# Patient Record
Sex: Male | Born: 1960
Health system: Southern US, Community
[De-identification: ages and names within clinical notes are randomized; demographics above are authoritative.]

## PROBLEM LIST (undated history)

## (undated) DIAGNOSIS — Z8673 Personal history of transient ischemic attack (TIA), and cerebral infarction without residual deficits: Secondary | ICD-10-CM

## (undated) DIAGNOSIS — D497 Neoplasm of unspecified behavior of endocrine glands and other parts of nervous system: Secondary | ICD-10-CM

## (undated) DIAGNOSIS — J449 Chronic obstructive pulmonary disease, unspecified: Secondary | ICD-10-CM

## (undated) DIAGNOSIS — E785 Hyperlipidemia, unspecified: Secondary | ICD-10-CM

## (undated) DIAGNOSIS — I6529 Occlusion and stenosis of unspecified carotid artery: Secondary | ICD-10-CM

## (undated) DIAGNOSIS — I771 Stricture of artery: Secondary | ICD-10-CM

## (undated) DIAGNOSIS — G459 Transient cerebral ischemic attack, unspecified: Secondary | ICD-10-CM

## (undated) HISTORY — DX: Neoplasm of unspecified behavior of endocrine glands and other parts of nervous system: D49.7

## (undated) HISTORY — PX: OTHER SURGICAL HISTORY: SHX169

## (undated) HISTORY — DX: Hyperlipidemia, unspecified: E78.5

## (undated) HISTORY — PX: TRANSPHENOIDAL / TRANSNASAL HYPOPHYSECTOMY / RESECTION PITUITARY TUMOR: SUR1382

## (undated) HISTORY — DX: Personal history of transient ischemic attack (TIA), and cerebral infarction without residual deficits: Z86.73

## (undated) HISTORY — DX: Occlusion and stenosis of unspecified carotid artery: I65.29

## (undated) HISTORY — DX: Stricture of artery: I77.1

## (undated) HISTORY — PX: HIP ARTHROPLASTY: SHX981

## (undated) HISTORY — DX: Transient cerebral ischemic attack, unspecified: G45.9

---

## 1994-10-30 HISTORY — PX: OTHER SURGICAL HISTORY: SHX169

## 2008-06-29 ENCOUNTER — Emergency Department (HOSPITAL_COMMUNITY): Admission: EM | Admit: 2008-06-29 | Discharge: 2008-06-29 | Payer: Self-pay | Admitting: Emergency Medicine

## 2011-11-07 DIAGNOSIS — J069 Acute upper respiratory infection, unspecified: Secondary | ICD-10-CM | POA: Diagnosis not present

## 2011-12-20 DIAGNOSIS — R220 Localized swelling, mass and lump, head: Secondary | ICD-10-CM | POA: Diagnosis not present

## 2011-12-20 DIAGNOSIS — K029 Dental caries, unspecified: Secondary | ICD-10-CM | POA: Diagnosis not present

## 2012-01-16 DIAGNOSIS — M25559 Pain in unspecified hip: Secondary | ICD-10-CM | POA: Diagnosis not present

## 2012-01-16 DIAGNOSIS — F329 Major depressive disorder, single episode, unspecified: Secondary | ICD-10-CM | POA: Diagnosis not present

## 2012-01-16 DIAGNOSIS — E782 Mixed hyperlipidemia: Secondary | ICD-10-CM | POA: Diagnosis not present

## 2012-01-16 DIAGNOSIS — M25569 Pain in unspecified knee: Secondary | ICD-10-CM | POA: Diagnosis not present

## 2012-01-16 DIAGNOSIS — Z0000 Encounter for general adult medical examination without abnormal findings: Secondary | ICD-10-CM | POA: Diagnosis not present

## 2012-01-16 DIAGNOSIS — I10 Essential (primary) hypertension: Secondary | ICD-10-CM | POA: Diagnosis not present

## 2012-01-16 DIAGNOSIS — Z125 Encounter for screening for malignant neoplasm of prostate: Secondary | ICD-10-CM | POA: Diagnosis not present

## 2012-03-06 DIAGNOSIS — Z1211 Encounter for screening for malignant neoplasm of colon: Secondary | ICD-10-CM | POA: Diagnosis not present

## 2012-03-06 DIAGNOSIS — Z800 Family history of malignant neoplasm of digestive organs: Secondary | ICD-10-CM | POA: Diagnosis not present

## 2012-03-21 DIAGNOSIS — F329 Major depressive disorder, single episode, unspecified: Secondary | ICD-10-CM | POA: Diagnosis not present

## 2012-03-21 DIAGNOSIS — Z8371 Family history of colonic polyps: Secondary | ICD-10-CM | POA: Diagnosis not present

## 2012-03-21 DIAGNOSIS — I498 Other specified cardiac arrhythmias: Secondary | ICD-10-CM | POA: Diagnosis not present

## 2012-03-21 DIAGNOSIS — K5730 Diverticulosis of large intestine without perforation or abscess without bleeding: Secondary | ICD-10-CM | POA: Diagnosis not present

## 2012-03-21 DIAGNOSIS — I10 Essential (primary) hypertension: Secondary | ICD-10-CM | POA: Diagnosis not present

## 2012-03-21 DIAGNOSIS — E782 Mixed hyperlipidemia: Secondary | ICD-10-CM | POA: Diagnosis not present

## 2012-03-21 DIAGNOSIS — D126 Benign neoplasm of colon, unspecified: Secondary | ICD-10-CM | POA: Diagnosis not present

## 2012-03-21 DIAGNOSIS — Z800 Family history of malignant neoplasm of digestive organs: Secondary | ICD-10-CM | POA: Diagnosis not present

## 2012-03-21 DIAGNOSIS — F17200 Nicotine dependence, unspecified, uncomplicated: Secondary | ICD-10-CM | POA: Diagnosis not present

## 2012-03-21 DIAGNOSIS — Z1211 Encounter for screening for malignant neoplasm of colon: Secondary | ICD-10-CM | POA: Diagnosis not present

## 2012-09-17 DIAGNOSIS — L600 Ingrowing nail: Secondary | ICD-10-CM | POA: Diagnosis not present

## 2012-10-08 DIAGNOSIS — L03039 Cellulitis of unspecified toe: Secondary | ICD-10-CM | POA: Diagnosis not present

## 2014-12-31 DIAGNOSIS — H53002 Unspecified amblyopia, left eye: Secondary | ICD-10-CM | POA: Diagnosis not present

## 2015-01-05 DIAGNOSIS — R2990 Unspecified symptoms and signs involving the nervous system: Secondary | ICD-10-CM | POA: Diagnosis not present

## 2015-01-05 DIAGNOSIS — R0989 Other specified symptoms and signs involving the circulatory and respiratory systems: Secondary | ICD-10-CM | POA: Diagnosis not present

## 2015-01-05 DIAGNOSIS — R42 Dizziness and giddiness: Secondary | ICD-10-CM | POA: Diagnosis not present

## 2015-01-05 DIAGNOSIS — Z720 Tobacco use: Secondary | ICD-10-CM | POA: Diagnosis not present

## 2015-01-08 ENCOUNTER — Other Ambulatory Visit: Payer: Self-pay | Admitting: Radiology

## 2015-01-08 ENCOUNTER — Other Ambulatory Visit (HOSPITAL_COMMUNITY): Payer: Self-pay | Admitting: Interventional Radiology

## 2015-01-08 ENCOUNTER — Ambulatory Visit (HOSPITAL_COMMUNITY)
Admission: RE | Admit: 2015-01-08 | Discharge: 2015-01-08 | Disposition: A | Discharge status: Home / Self Care | Payer: Medicare Other | Source: Ambulatory Visit | Attending: Interventional Radiology | Admitting: Interventional Radiology

## 2015-01-08 DIAGNOSIS — I6501 Occlusion and stenosis of right vertebral artery: Secondary | ICD-10-CM | POA: Diagnosis not present

## 2015-01-08 DIAGNOSIS — I6522 Occlusion and stenosis of left carotid artery: Secondary | ICD-10-CM

## 2015-01-08 DIAGNOSIS — I6523 Occlusion and stenosis of bilateral carotid arteries: Secondary | ICD-10-CM | POA: Diagnosis not present

## 2015-01-08 DIAGNOSIS — I70208 Unspecified atherosclerosis of native arteries of extremities, other extremity: Secondary | ICD-10-CM | POA: Diagnosis not present

## 2015-01-08 DIAGNOSIS — R279 Unspecified lack of coordination: Secondary | ICD-10-CM | POA: Diagnosis not present

## 2015-01-08 DIAGNOSIS — R2990 Unspecified symptoms and signs involving the nervous system: Secondary | ICD-10-CM | POA: Diagnosis not present

## 2015-01-08 DIAGNOSIS — I6529 Occlusion and stenosis of unspecified carotid artery: Secondary | ICD-10-CM | POA: Diagnosis not present

## 2015-01-08 DIAGNOSIS — R42 Dizziness and giddiness: Secondary | ICD-10-CM | POA: Diagnosis not present

## 2015-01-08 DIAGNOSIS — Z9989 Dependence on other enabling machines and devices: Secondary | ICD-10-CM | POA: Diagnosis not present

## 2015-01-08 DIAGNOSIS — F17210 Nicotine dependence, cigarettes, uncomplicated: Secondary | ICD-10-CM | POA: Diagnosis not present

## 2015-01-08 DIAGNOSIS — H538 Other visual disturbances: Secondary | ICD-10-CM | POA: Diagnosis present

## 2015-01-08 DIAGNOSIS — I6509 Occlusion and stenosis of unspecified vertebral artery: Secondary | ICD-10-CM | POA: Diagnosis not present

## 2015-01-08 LAB — CBC WITH DIFFERENTIAL/PLATELET
BASOS ABS: 0.1 10*3/uL (ref 0.0–0.1)
Basophils Relative: 1 % (ref 0–1)
Eosinophils Absolute: 0.1 10*3/uL (ref 0.0–0.7)
Eosinophils Relative: 1 % (ref 0–5)
HEMATOCRIT: 41.1 % (ref 39.0–52.0)
HEMOGLOBIN: 13.3 g/dL (ref 13.0–17.0)
LYMPHS ABS: 3 10*3/uL (ref 0.7–4.0)
LYMPHS PCT: 29 % (ref 12–46)
MCH: 28.7 pg (ref 26.0–34.0)
MCHC: 32.4 g/dL (ref 30.0–36.0)
MCV: 88.6 fL (ref 78.0–100.0)
MONO ABS: 0.7 10*3/uL (ref 0.1–1.0)
MONOS PCT: 7 % (ref 3–12)
NEUTROS ABS: 6.3 10*3/uL (ref 1.7–7.7)
NEUTROS PCT: 62 % (ref 43–77)
Platelets: 326 10*3/uL (ref 150–400)
RBC: 4.64 MIL/uL (ref 4.22–5.81)
RDW: 12.8 % (ref 11.5–15.5)
WBC: 10.1 10*3/uL (ref 4.0–10.5)

## 2015-01-08 LAB — BASIC METABOLIC PANEL
ANION GAP: 16 — AB (ref 5–15)
BUN: <5 mg/dL — ABNORMAL LOW (ref 6–23)
CALCIUM: 9.6 mg/dL (ref 8.4–10.5)
CHLORIDE: 102 mmol/L (ref 96–112)
CO2: 26 mmol/L (ref 19–32)
Creatinine, Ser: 0.93 mg/dL (ref 0.50–1.35)
GFR calc Af Amer: >90 mL/min (ref >90)
Glucose, Bld: 94 mg/dL (ref 70–99)
POTASSIUM: 3.7 mmol/L (ref 3.5–5.1)
SODIUM: 144 mmol/L (ref 135–145)

## 2015-01-08 LAB — APTT: aPTT: 34 seconds (ref 24–37)

## 2015-01-08 LAB — PROTIME-INR
INR: 1.09 (ref 0.00–1.49)
Prothrombin Time: 14.2 seconds (ref 11.6–15.2)

## 2015-01-08 MED ORDER — ASPIRIN 325 MG PO TABS
ORAL_TABLET | ORAL | Status: AC
  Administered 2015-01-08: 325 mg
  Filled 2015-01-08: qty 1

## 2015-01-08 MED ORDER — MIDAZOLAM HCL 2 MG/2ML IJ SOLN
INTRAMUSCULAR | Status: AC
  Filled 2015-01-08: qty 2

## 2015-01-08 MED ORDER — LIDOCAINE HCL 1 % IJ SOLN
INTRAMUSCULAR | Status: AC
  Filled 2015-01-08: qty 20

## 2015-01-08 MED ORDER — FENTANYL CITRATE 0.05 MG/ML IJ SOLN
INTRAMUSCULAR | Status: AC
  Filled 2015-01-08: qty 2

## 2015-01-08 MED ORDER — SODIUM CHLORIDE 0.9 % IV SOLN
INTRAVENOUS | Status: DC
  Administered 2015-01-08: 12:00:00 via INTRAVENOUS

## 2015-01-08 MED ORDER — FENTANYL CITRATE 0.05 MG/ML IJ SOLN
INTRAMUSCULAR | Status: AC | PRN
  Administered 2015-01-08: 25 ug via INTRAVENOUS

## 2015-01-08 MED ORDER — HEPARIN SOD (PORK) LOCK FLUSH 100 UNIT/ML IV SOLN
INTRAVENOUS | Status: AC | PRN
  Administered 2015-01-08: 1000 [IU] via INTRAVENOUS
  Administered 2015-01-08: 500 [IU] via INTRAVENOUS

## 2015-01-08 MED ORDER — SODIUM CHLORIDE 0.9 % IV SOLN: INTRAVENOUS | Status: AC

## 2015-01-08 MED ORDER — IOHEXOL 300 MG/ML  SOLN
150.0000 mL | Freq: Once | INTRAMUSCULAR | Status: AC | PRN
  Administered 2015-01-08: 120 mL via INTRAVENOUS

## 2015-01-08 NOTE — Sedation Documentation (Signed)
Patient denies pain and is resting comfortably.  

## 2015-01-08 NOTE — Procedures (Signed)
S/P arch aortogram ,and Rt common carotid arteriogram. Rt CFa approach. Findings . 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 3.90 % stenosis of Rt VA origin 4.Lt subclavian steal

## 2015-01-08 NOTE — Discharge Instructions (Signed)

## 2015-01-08 NOTE — H&P (Signed)
Chief Complaint: Left eye headaches and dizziness x 5 weeks.  Referring Physician(s): Wagner,Jaime  History of Present Illness: Raymond Carney is a 54 y.o. male with c/o left orbital HA and change in vision with left sided blurry vision and diplopia x 5 weeks. He also c/o dizziness x 5 weeks and was scheduled for carotid US today which was abnormal and has been referred to Dr. [name] for arteriogram. He has history of pituitary surgery in the 1990's and denies any history of DM, HTN or HLP. He does smoke tobacco approximately 2packs/week. He does not take any medications regularly. He denies any history of bleeding disorders or any active signs of bleeding. He denies any chest pain, shortness of breath or palpitations. He denies any recent fever or chills. The patient denies any history of sleep apnea or chronic oxygen use. He has previously tolerated anesthesia with a hip surgery without complications.   PmHx: Tobacco use  SurgHX: Hip surgery, pituitary surgery  Allergies: Review of patient's allergies indicates no known allergies.  Medications: NONE. Prior to Admission medications   Not on File   No family history on file.  History   Social History  . Marital Status: Single    Spouse Name: N/A  . Number of Children: N/A  . Years of Education: N/A   Social History Main Topics  . Smoking status: Not on file  . Smokeless tobacco: Not on file  . Alcohol Use: Not on file  . Drug Use: Not on file  . Sexual Activity: Not on file   Other Topics Concern  . Not on file   Social History Narrative  . No narrative on file   Review of Systems: A 12 point ROS discussed and pertinent positives are indicated in the HPI above.  All other systems are negative.  Review of Systems  Vital Signs: BP 104/71 mmHg  Pulse 44  Temp(Src) 97.4 F (36.3 C)  SpO2 98%  Physical Exam  Constitutional: He is oriented to person, place, and time. No distress.  HENT:  Head:  Normocephalic and atraumatic.  Neck: No tracheal deviation present.  Cardiovascular: Normal rate and regular rhythm.  Exam reveals no gallop and no friction rub.   No murmur heard. Right carotid bruit  Pulmonary/Chest: Breath sounds normal. No respiratory distress. He has no wheezes. He has no rales.  Abdominal: Soft. Bowel sounds are normal. He exhibits no distension. There is no tenderness.  Neurological: He is alert and oriented to person, place, and time.  Skin: He is not diaphoretic.  Psychiatric: He has a normal mood and affect. His behavior is normal. Thought content normal.    Mallampati Score:  MD Evaluation Airway: WNL Heart: WNL Abdomen: WNL Chest/ Lungs: WNL ASA  Classification: 2 Mallampati/Airway Score: Two  Imaging: No results found.  Labs:  CBC:  Recent Labs  01/08/15 1152  WBC 10.1  HGB 13.3  HCT 41.1  PLT 326    COAGS:  Recent Labs  01/08/15 1152  INR 1.09    BMP:  Recent Labs  01/08/15 1152  NA 144  K 3.7  CL 102  CO2 26  GLUCOSE 94  BUN <5*  CALCIUM 9.6  CREATININE 0.93  GFRNONAA >90  GFRAA >90    Assessment and Plan: History of pituitary surgery with ongoing left eye blurriness and double vision Left orbital headache 5 weeks  Dizziness x weeks Tobacco use 2packs per week Carotid US today with abnormal findings suggestive of left  CCA occlusion and Right ICA/ECA stenosis with moderate sedation The patient has been NPO, no blood thinners taken, labs and vitals have been reviewed. Risks and Benefits discussed with the patient including, but not limited to bleeding, infection or stroke. All of the patient's questions were answered, patient is agreeable to proceed. Consent signed and in chart.   SignedHedy Jacob 01/08/2015, 1:34 PM

## 2015-01-12 ENCOUNTER — Other Ambulatory Visit (HOSPITAL_COMMUNITY): Payer: Self-pay | Admitting: Interventional Radiology

## 2015-01-12 DIAGNOSIS — I639 Cerebral infarction, unspecified: Secondary | ICD-10-CM

## 2015-02-01 ENCOUNTER — Ambulatory Visit (HOSPITAL_COMMUNITY)
Admission: RE | Admit: 2015-02-01 | Discharge: 2015-02-01 | Disposition: A | Discharge status: Home / Self Care | Payer: Medicare Other | Source: Ambulatory Visit | Attending: Interventional Radiology | Admitting: Interventional Radiology

## 2015-02-01 DIAGNOSIS — I6529 Occlusion and stenosis of unspecified carotid artery: Secondary | ICD-10-CM | POA: Diagnosis not present

## 2015-02-01 DIAGNOSIS — I639 Cerebral infarction, unspecified: Secondary | ICD-10-CM

## 2015-02-02 DIAGNOSIS — E785 Hyperlipidemia, unspecified: Secondary | ICD-10-CM | POA: Diagnosis not present

## 2015-02-02 DIAGNOSIS — J449 Chronic obstructive pulmonary disease, unspecified: Secondary | ICD-10-CM | POA: Diagnosis not present

## 2015-02-02 DIAGNOSIS — G479 Sleep disorder, unspecified: Secondary | ICD-10-CM | POA: Diagnosis not present

## 2015-02-02 DIAGNOSIS — Z87891 Personal history of nicotine dependence: Secondary | ICD-10-CM | POA: Diagnosis not present

## 2015-02-02 DIAGNOSIS — Z1212 Encounter for screening for malignant neoplasm of rectum: Secondary | ICD-10-CM | POA: Diagnosis not present

## 2015-02-02 DIAGNOSIS — I739 Peripheral vascular disease, unspecified: Secondary | ICD-10-CM | POA: Diagnosis not present

## 2015-02-02 DIAGNOSIS — Z6820 Body mass index (BMI) 20.0-20.9, adult: Secondary | ICD-10-CM | POA: Diagnosis not present

## 2015-02-02 DIAGNOSIS — I10 Essential (primary) hypertension: Secondary | ICD-10-CM | POA: Diagnosis not present

## 2015-02-02 DIAGNOSIS — R0602 Shortness of breath: Secondary | ICD-10-CM | POA: Diagnosis not present

## 2015-02-02 DIAGNOSIS — Z0000 Encounter for general adult medical examination without abnormal findings: Secondary | ICD-10-CM | POA: Diagnosis not present

## 2015-02-02 DIAGNOSIS — R2990 Unspecified symptoms and signs involving the nervous system: Secondary | ICD-10-CM | POA: Diagnosis not present

## 2015-02-02 DIAGNOSIS — R195 Other fecal abnormalities: Secondary | ICD-10-CM | POA: Diagnosis not present

## 2015-02-04 ENCOUNTER — Other Ambulatory Visit: Payer: Self-pay | Admitting: Radiology

## 2015-02-05 ENCOUNTER — Other Ambulatory Visit (HOSPITAL_COMMUNITY): Payer: Self-pay | Admitting: Interventional Radiology

## 2015-02-05 DIAGNOSIS — I639 Cerebral infarction, unspecified: Secondary | ICD-10-CM

## 2015-02-05 DIAGNOSIS — I771 Stricture of artery: Secondary | ICD-10-CM

## 2015-02-18 DIAGNOSIS — J449 Chronic obstructive pulmonary disease, unspecified: Secondary | ICD-10-CM | POA: Diagnosis not present

## 2015-02-18 DIAGNOSIS — R5383 Other fatigue: Secondary | ICD-10-CM | POA: Diagnosis not present

## 2015-02-18 DIAGNOSIS — Z87891 Personal history of nicotine dependence: Secondary | ICD-10-CM | POA: Diagnosis not present

## 2015-02-22 DIAGNOSIS — E559 Vitamin D deficiency, unspecified: Secondary | ICD-10-CM | POA: Diagnosis not present

## 2015-02-23 ENCOUNTER — Ambulatory Visit (HOSPITAL_COMMUNITY): Admission: RE | Admit: 2015-02-23 | Payer: Medicare Other | Source: Ambulatory Visit

## 2015-02-23 ENCOUNTER — Ambulatory Visit (HOSPITAL_COMMUNITY)
Admission: RE | Admit: 2015-02-23 | Discharge: 2015-02-23 | Disposition: A | Discharge status: Home / Self Care | Payer: Medicare Other | Source: Ambulatory Visit | Attending: Interventional Radiology | Admitting: Interventional Radiology

## 2015-02-23 DIAGNOSIS — I6522 Occlusion and stenosis of left carotid artery: Secondary | ICD-10-CM | POA: Insufficient documentation

## 2015-02-23 DIAGNOSIS — H538 Other visual disturbances: Secondary | ICD-10-CM | POA: Insufficient documentation

## 2015-02-23 DIAGNOSIS — I771 Stricture of artery: Secondary | ICD-10-CM

## 2015-02-23 DIAGNOSIS — R42 Dizziness and giddiness: Secondary | ICD-10-CM | POA: Insufficient documentation

## 2015-02-23 DIAGNOSIS — I639 Cerebral infarction, unspecified: Secondary | ICD-10-CM | POA: Diagnosis not present

## 2015-02-23 DIAGNOSIS — I720 Aneurysm of carotid artery: Secondary | ICD-10-CM | POA: Diagnosis not present

## 2015-02-23 LAB — POCT I-STAT CREATININE: Creatinine, Ser: 0.9 mg/dL (ref 0.50–1.35)

## 2015-02-23 MED ORDER — GADOBENATE DIMEGLUMINE 529 MG/ML IV SOLN
15.0000 mL | Freq: Once | INTRAVENOUS | Status: AC | PRN
  Administered 2015-02-23: 15 mL via INTRAVENOUS

## 2015-02-26 DIAGNOSIS — Z87891 Personal history of nicotine dependence: Secondary | ICD-10-CM | POA: Diagnosis not present

## 2015-02-26 DIAGNOSIS — J449 Chronic obstructive pulmonary disease, unspecified: Secondary | ICD-10-CM | POA: Diagnosis not present

## 2015-02-26 DIAGNOSIS — R5383 Other fatigue: Secondary | ICD-10-CM | POA: Diagnosis not present

## 2015-03-02 DIAGNOSIS — J449 Chronic obstructive pulmonary disease, unspecified: Secondary | ICD-10-CM | POA: Diagnosis not present

## 2015-03-18 DIAGNOSIS — Z01810 Encounter for preprocedural cardiovascular examination: Secondary | ICD-10-CM | POA: Diagnosis not present

## 2015-03-18 DIAGNOSIS — J449 Chronic obstructive pulmonary disease, unspecified: Secondary | ICD-10-CM | POA: Diagnosis not present

## 2015-03-24 DIAGNOSIS — Z87891 Personal history of nicotine dependence: Secondary | ICD-10-CM | POA: Diagnosis not present

## 2015-03-24 DIAGNOSIS — R5383 Other fatigue: Secondary | ICD-10-CM | POA: Diagnosis not present

## 2015-03-24 DIAGNOSIS — J449 Chronic obstructive pulmonary disease, unspecified: Secondary | ICD-10-CM | POA: Diagnosis not present

## 2015-05-23 ENCOUNTER — Inpatient Hospital Stay (HOSPITAL_COMMUNITY)
Admission: EM | Admit: 2015-05-23 | Discharge: 2015-05-27 | DRG: 037 | Disposition: A | Discharge status: Home / Self Care | Payer: Medicare Other | Attending: Neurology | Admitting: Neurology

## 2015-05-23 ENCOUNTER — Emergency Department (HOSPITAL_COMMUNITY): Payer: Medicare Other

## 2015-05-23 ENCOUNTER — Encounter (HOSPITAL_COMMUNITY): Payer: Self-pay

## 2015-05-23 DIAGNOSIS — G459 Transient cerebral ischemic attack, unspecified: Secondary | ICD-10-CM | POA: Diagnosis not present

## 2015-05-23 DIAGNOSIS — I708 Atherosclerosis of other arteries: Secondary | ICD-10-CM | POA: Diagnosis present

## 2015-05-23 DIAGNOSIS — I6501 Occlusion and stenosis of right vertebral artery: Secondary | ICD-10-CM | POA: Diagnosis not present

## 2015-05-23 DIAGNOSIS — R471 Dysarthria and anarthria: Secondary | ICD-10-CM | POA: Diagnosis not present

## 2015-05-23 DIAGNOSIS — I771 Stricture of artery: Secondary | ICD-10-CM

## 2015-05-23 DIAGNOSIS — E785 Hyperlipidemia, unspecified: Secondary | ICD-10-CM | POA: Diagnosis present

## 2015-05-23 DIAGNOSIS — I7410 Embolism and thrombosis of unspecified parts of aorta: Secondary | ICD-10-CM | POA: Diagnosis not present

## 2015-05-23 DIAGNOSIS — R4701 Aphasia: Secondary | ICD-10-CM | POA: Diagnosis present

## 2015-05-23 DIAGNOSIS — R001 Bradycardia, unspecified: Secondary | ICD-10-CM | POA: Diagnosis present

## 2015-05-23 DIAGNOSIS — I63512 Cerebral infarction due to unspecified occlusion or stenosis of left middle cerebral artery: Secondary | ICD-10-CM | POA: Diagnosis present

## 2015-05-23 DIAGNOSIS — J449 Chronic obstructive pulmonary disease, unspecified: Secondary | ICD-10-CM | POA: Diagnosis present

## 2015-05-23 DIAGNOSIS — I63032 Cerebral infarction due to thrombosis of left carotid artery: Secondary | ICD-10-CM | POA: Diagnosis not present

## 2015-05-23 DIAGNOSIS — I63211 Cerebral infarction due to unspecified occlusion or stenosis of right vertebral arteries: Secondary | ICD-10-CM | POA: Diagnosis not present

## 2015-05-23 DIAGNOSIS — I6789 Other cerebrovascular disease: Secondary | ICD-10-CM | POA: Diagnosis not present

## 2015-05-23 DIAGNOSIS — I639 Cerebral infarction, unspecified: Secondary | ICD-10-CM | POA: Diagnosis present

## 2015-05-23 DIAGNOSIS — I742 Embolism and thrombosis of arteries of the upper extremities: Secondary | ICD-10-CM | POA: Diagnosis not present

## 2015-05-23 DIAGNOSIS — R531 Weakness: Secondary | ICD-10-CM | POA: Diagnosis not present

## 2015-05-23 DIAGNOSIS — R1310 Dysphagia, unspecified: Secondary | ICD-10-CM | POA: Diagnosis present

## 2015-05-23 DIAGNOSIS — I7409 Other arterial embolism and thrombosis of abdominal aorta: Secondary | ICD-10-CM | POA: Diagnosis not present

## 2015-05-23 DIAGNOSIS — Z7982 Long term (current) use of aspirin: Secondary | ICD-10-CM | POA: Diagnosis not present

## 2015-05-23 DIAGNOSIS — R4781 Slurred speech: Secondary | ICD-10-CM | POA: Diagnosis not present

## 2015-05-23 DIAGNOSIS — M6289 Other specified disorders of muscle: Secondary | ICD-10-CM | POA: Diagnosis not present

## 2015-05-23 DIAGNOSIS — I69920 Aphasia following unspecified cerebrovascular disease: Secondary | ICD-10-CM | POA: Diagnosis not present

## 2015-05-23 DIAGNOSIS — Z7902 Long term (current) use of antithrombotics/antiplatelets: Secondary | ICD-10-CM

## 2015-05-23 HISTORY — DX: Chronic obstructive pulmonary disease, unspecified: J44.9

## 2015-05-23 LAB — COMPREHENSIVE METABOLIC PANEL
ALK PHOS: 57 U/L (ref 38–126)
ALT: 15 U/L — AB (ref 17–63)
AST: 19 U/L (ref 15–41)
Albumin: 3.8 g/dL (ref 3.5–5.0)
Anion gap: 9 (ref 5–15)
BILIRUBIN TOTAL: 0.3 mg/dL (ref 0.3–1.2)
BUN: 9 mg/dL (ref 6–20)
CALCIUM: 8.4 mg/dL — AB (ref 8.9–10.3)
CHLORIDE: 99 mmol/L — AB (ref 101–111)
CO2: 24 mmol/L (ref 22–32)
Creatinine, Ser: 0.79 mg/dL (ref 0.61–1.24)
GLUCOSE: 102 mg/dL — AB (ref 65–99)
POTASSIUM: 3.8 mmol/L (ref 3.5–5.1)
Sodium: 132 mmol/L — ABNORMAL LOW (ref 135–145)
Total Protein: 6.5 g/dL (ref 6.5–8.1)

## 2015-05-23 LAB — CBC
HCT: 40.5 % (ref 39.0–52.0)
HCT: 37.1 % — ABNORMAL LOW (ref 39.0–52.0)
HEMOGLOBIN: 12.1 g/dL — AB (ref 13.0–17.0)
Hemoglobin: 13.3 g/dL (ref 13.0–17.0)
MCH: 28.1 pg (ref 26.0–34.0)
MCH: 28.1 pg (ref 26.0–34.0)
MCHC: 32.8 g/dL (ref 30.0–36.0)
MCHC: 32.6 g/dL (ref 30.0–36.0)
MCV: 85.4 fL (ref 78.0–100.0)
MCV: 86.3 fL (ref 78.0–100.0)
PLATELETS: 246 10*3/uL (ref 150–400)
Platelets: 232 10*3/uL (ref 150–400)
RBC: 4.74 MIL/uL (ref 4.22–5.81)
RBC: 4.3 MIL/uL (ref 4.22–5.81)
RDW: 13.7 % (ref 11.5–15.5)
RDW: 13.7 % (ref 11.5–15.5)
WBC: 8.1 10*3/uL (ref 4.0–10.5)
WBC: 9.7 10*3/uL (ref 4.0–10.5)

## 2015-05-23 LAB — I-STAT CHEM 8, ED
BUN: 11 mg/dL (ref 6–20)
CHLORIDE: 105 mmol/L (ref 101–111)
CREATININE: 0.8 mg/dL (ref 0.61–1.24)
Calcium, Ion: 1.09 mmol/L — ABNORMAL LOW (ref 1.12–1.23)
GLUCOSE: 100 mg/dL — AB (ref 65–99)
HEMATOCRIT: 39 % (ref 39.0–52.0)
Hemoglobin: 13.3 g/dL (ref 13.0–17.0)
Potassium: 4.1 mmol/L (ref 3.5–5.1)
SODIUM: 141 mmol/L (ref 135–145)
TCO2: 25 mmol/L (ref 0–100)

## 2015-05-23 LAB — DIFFERENTIAL
Basophils Absolute: 0.1 10*3/uL (ref 0.0–0.1)
Basophils Relative: 1 % (ref 0–1)
Eosinophils Absolute: 0.1 10*3/uL (ref 0.0–0.7)
Eosinophils Relative: 2 % (ref 0–5)
LYMPHS ABS: 2.6 10*3/uL (ref 0.7–4.0)
Lymphocytes Relative: 33 % (ref 12–46)
Monocytes Absolute: 0.5 10*3/uL (ref 0.1–1.0)
Monocytes Relative: 7 % (ref 3–12)
Neutro Abs: 4.7 10*3/uL (ref 1.7–7.7)
Neutrophils Relative %: 59 % (ref 43–77)

## 2015-05-23 LAB — APTT: aPTT: 29 seconds (ref 24–37)

## 2015-05-23 LAB — I-STAT CG4 LACTIC ACID, ED: Lactic Acid, Venous: 1.63 mmol/L (ref 0.5–2.0)

## 2015-05-23 LAB — PROTIME-INR
INR: 1.06 (ref 0.00–1.49)
Prothrombin Time: 14 seconds (ref 11.6–15.2)

## 2015-05-23 LAB — CBG MONITORING, ED: Glucose-Capillary: 98 mg/dL (ref 65–99)

## 2015-05-23 LAB — I-STAT TROPONIN, ED: Troponin i, poc: 0 ng/mL (ref 0.00–0.08)

## 2015-05-23 MED ORDER — STROKE: EARLY STAGES OF RECOVERY BOOK
Freq: Once | Status: AC
  Administered 2015-05-23: 23:00:00
  Filled 2015-05-23: qty 1

## 2015-05-23 MED ORDER — SODIUM CHLORIDE 0.9 % IV BOLUS (SEPSIS)
500.0000 mL | Freq: Once | INTRAVENOUS | Status: AC
  Administered 2015-05-23: 500 mL via INTRAVENOUS

## 2015-05-23 MED ORDER — SODIUM CHLORIDE 0.9 % IV SOLN
Freq: Once | INTRAVENOUS | Status: AC
  Administered 2015-05-23: 22:00:00 via INTRAVENOUS

## 2015-05-23 MED ORDER — SENNOSIDES-DOCUSATE SODIUM 8.6-50 MG PO TABS
1.0000 | ORAL_TABLET | Freq: Every evening | ORAL | Status: DC | PRN
  Filled 2015-05-23: qty 1

## 2015-05-23 MED ORDER — ENOXAPARIN SODIUM 40 MG/0.4ML ~~LOC~~ SOLN
40.0000 mg | SUBCUTANEOUS | Status: DC
  Administered 2015-05-25: 40 mg via SUBCUTANEOUS
  Filled 2015-05-23 (×2): qty 0.4

## 2015-05-23 MED ORDER — SODIUM CHLORIDE 0.9 % IV BOLUS (SEPSIS)
1000.0000 mL | Freq: Once | INTRAVENOUS | Status: AC
  Administered 2015-05-23: 1000 mL via INTRAVENOUS

## 2015-05-23 NOTE — ED Notes (Signed)
Updated rapid response Wes with BP and MRI delay.

## 2015-05-23 NOTE — ED Notes (Signed)
To ED via EMS.  Onset 1800 LSN, right sided weakness, slurred speech, aphasia, lasting 1 hr 15 min.  EMS put IV in and pt returned to baseline @ 1911.  At 1935 symptoms returned.  1942 pt back at baseline.

## 2015-05-23 NOTE — ED Notes (Signed)
Katy NP with Critical Care came by room to advise d/t poor vascular system BP may not be as accurate and she will come by later to check on patient again.  From her perspective he may go to MRI.  Dr. Johnney Killian made aware.

## 2015-05-23 NOTE — ED Notes (Signed)
Called MRI to report per Dr. Johnney Killian ok to go to MRI.

## 2015-05-23 NOTE — ED Notes (Signed)
Per Dr. Johnney Killian delay MRI until BP can be managed.

## 2015-05-23 NOTE — ED Provider Notes (Signed)
CSN: [id number]     Arrival date & time 05/23/15  1947 History   First MD Initiated Contact with Patient 05/23/15 1949     Chief Complaint  Patient presents with  . Code Stroke    An emergency department physician performed an initial assessment on this suspected stroke patient at 54. (Consider location/radiation/quality/duration/timing/severity/associated sxs/prior Treatment) HPI  Patient is a 54 year old male with a history of carotid artery stenosis bilaterally presented today as a code stroke. Patient reports at roughly 6 PM (2 hours ago) he began having mild right hip pain progressed to difficulty talking and right-sided weakness. Patient has been seen and evaluated for carotid stenosis previously in March. Reports having low blood pressure then and it has been difficult to to measure at times because it is so low. Today patient reports after the weakness began and continued until EMS arrived. When EMS arrived symptoms resolved. Patient reports no chest pain, shortness breath, dizziness, fevers, chills, headaches, nausea, vomiting, blurred vision, double vision over the last several days or weeks. Immediately prior to arrival EMS reported symptoms began again. On arrival patient able to speak  Past Medical History  Diagnosis Date  . COPD (chronic obstructive pulmonary disease)    No past surgical history on file. No family history on file. History  Substance Use Topics  . Smoking status: Not on file  . Smokeless tobacco: Not on file  . Alcohol Use: Not on file    Review of Systems  Constitutional: Negative for fever and chills.  HENT: Negative for congestion and sore throat.   Eyes: Negative for pain.  Respiratory: Negative for cough and shortness of breath.   Cardiovascular: Negative for chest pain and palpitations.  Gastrointestinal: Negative for nausea, vomiting, abdominal pain and diarrhea.  Endocrine: Negative.   Genitourinary: Negative for flank pain.  Musculoskeletal:  Negative for back pain and neck pain.  Skin: Negative for rash.  Allergic/Immunologic: Negative.   Neurological: Positive for speech difficulty and weakness. Negative for dizziness, tremors, seizures, syncope, facial asymmetry, light-headedness, numbness and headaches.  Psychiatric/Behavioral: Negative for confusion.    Allergies  Review of patient's allergies indicates no known allergies.  Home Medications   Prior to Admission medications   Not on File   BP 82/57 mmHg  Pulse 51  Temp(Src) 97.5 F (36.4 C)  Resp 10  SpO2 100% Physical Exam  Constitutional: He is oriented to person, place, and time. He appears well-developed and well-nourished.  HENT:  Head: Normocephalic and atraumatic.  Eyes: Conjunctivae and EOM are normal. Pupils are equal, round, and reactive to light.  Neck: Normal range of motion. Neck supple.  Cardiovascular: Normal rate, regular rhythm, normal heart sounds and intact distal pulses.   Pulmonary/Chest: Effort normal and breath sounds normal. No respiratory distress.  Abdominal: Soft. Bowel sounds are normal. There is no tenderness.  Musculoskeletal: Normal range of motion.  Neurological: He is alert and oriented to person, place, and time. He has normal strength and normal reflexes. He displays normal reflexes. No cranial nerve deficit or sensory deficit. He displays a negative Romberg sign. GCS eye subscore is 4. GCS verbal subscore is 5. GCS motor subscore is 6.  Normal finger to nose bilaterally.  Rapid alternating movements intact bilaterally.  Normal heal to shin bilaterally.   No pronator drift bilaterally.   (full neuro exam s/p CT scanner)  Skin: Skin is warm and dry.    ED Course  Procedures (including critical care time) Labs Review Labs Reviewed  I-STAT CHEM  8, ED - Abnormal; Notable for the following:    Glucose, Bld 100 (*)    Calcium, Ion 1.09 (*)    All other components within normal limits  CBC  DIFFERENTIAL  PROTIME-INR  APTT   COMPREHENSIVE METABOLIC PANEL  I-STAT TROPOININ, ED  CBG MONITORING, ED    Imaging Review Ct Head Wo Contrast  05/23/2015   CLINICAL DATA:  Right-sided weakness and slurred speech  EXAM: CT HEAD WITHOUT CONTRAST  TECHNIQUE: Contiguous axial images were obtained from the base of the skull through the vertex without intravenous contrast.  COMPARISON:  MR brain 02/23/2015  FINDINGS: There is no evidence of mass effect, midline shift, or extra-axial fluid collections. There is no evidence of a space-occupying lesion or intracranial hemorrhage. There is no evidence of a cortical-based area of acute infarction. There is an old left occipital lobe infarct. There is an old small left corona radiata infarct. There is generalized cerebral atrophy.  The ventricles and sulci are appropriate for the patient's age. The basal cisterns are patent.  Visualized portions of the orbits are unremarkable. The visualized portions of the paranasal sinuses and mastoid air cells are unremarkable. Cerebrovascular atherosclerotic calcifications are noted.  The osseous structures are unremarkable.  IMPRESSION: 1. No acute intracranial pathology. 2. Old left occipital lobe infarct. These results were called by telephone at the time of interpretation on 05/23/2015 at 7:52 pm to Dr. Dr. Janann Colonel, who verbally acknowledged these results.   Electronically Signed   By: Kathreen Devoid   On: 05/23/2015 19:57     EKG Interpretation None      MDM   Final diagnoses:  None    On initial evaluation airway cleared with gross right sided deficits including facial droop and right arm weakness. Pt taken to CT scanner but on arrival back to room full resolution of symptoms.  Neurology evaluated pt at bedside and found to not be TPA candidate and likely TIA.    Despite receiving multiple fluid boluses pt remained hypotensive in the ED.  Family reports has had very low BP in the past and asymptomatic.  Pt continued to have normal mentation.   Discussed with ICU who recommended not to start pressors at this time. Pt afebrile and not tachycardic.  Doubt infectious etiology.  No hx of CHF and doubt cardiogenic shock.  Not hypoxic and doubt obstructive shock from PE.  Possible that arteries extremely calcified leading to false readings.  Pt subsequently admitted to the neuro ICU for further monitoring and tx of TIA and hypotension.   If performed, labs, EKGs, and imaging were reviewed/interpreted by myself and my attending and incorporated into medical decision making.  Discussed pertinent finding with patient or caregiver prior to admission with no further questions.  Pt care supervised by my attending Dr. Johnney Killian.   Geronimo Boot, MD PGY-2  Emergency Medicine   Pt    Geronimo Boot, MD 05/25/15 [place], MD 05/30/15 [phone]

## 2015-05-23 NOTE — Consult Note (Addendum)
Stroke Consult    Chief Complaint: speech deficits and right sided weakness  HPI: Raymond Carney is an 54 y.o. 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 Presents with stuttering symptoms of speech difficulty and right sided weakness. Speech deficits described as dysarthria and an apparent expressive aphasia. Acute onset of symptoms around 1800, resolved around 1hr later and then returned around 1935, this time lasting around 15 minutes before mostly resolving again. (dysarthria remains).  Reports taking ASA and Plavix at home.  CT head imaging reviewed, shows no acute process. Patient is followed by Dr [name] who has discussed possibility of endovascular revascularization. Patient last had IR angio on 01/08/2015. 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 COPD (chronic obstructive pulmonary disease)     No past surgical history on file.  No family history on file. Social History:  has no tobacco, alcohol, and drug history on file.  Allergies: No Known Allergies   (Not in a hospital admission)  ROS: Out of a complete 14 system review, the patient complains of only the following symptoms, and all other reviewed systems are negative. +weakness, speech difficulty  Physical Examination: Filed Vitals:   05/23/15 1955  BP: 110/84  Pulse: 57  Resp: 18   Physical Exam  Constitutional: He appears well-developed  and well-nourished.  Psych: Affect appropriate to situation Eyes: No scleral injection HENT: No OP obstrucion Head: Normocephalic.  Cardiovascular: Normal rate and regular rhythm.  Respiratory: Effort normal and breath sounds normal.  GI: Soft. Bowel sounds are normal. No distension. There is no tenderness.  Skin: WDI  Neurologic Examination: Mental Status: Alert, oriented, thought content appropriate.  Speech fluent without evidence of aphasia. Mild dysarthria.  Able to follow 3 step commands without difficulty. Cranial Nerves: II: funduscopic exam wnl bilaterally, visual fields grossly normal, pupils equal, round, reactive to light and accommodation III,IV, VI: ptosis not present, extra-ocular motions intact bilaterally V,VII: smile symmetric, facial light touch sensation normal bilaterally VIII: hearing normal bilaterally IX,X: gag reflex present XI: trapezius strength/neck flexion strength normal bilaterally XII: tongue strength normal  Motor: Right : Upper extremity    Left:     Upper extremity 5/5 deltoid       5/5 deltoid 5/5 biceps      5/5 biceps  5/5 triceps      5/5 triceps 5/5 hand grip      5/5 hand grip  Lower extremity     Lower extremity 5/5 hip flexor      5/5 hip flexor 5/5 quadricep      5/5 quadriceps  5/5 hamstrings     5/5 hamstrings 5/5 plantar flexion       5/5 plantar flexion 5/5 plantar extension     5/5 plantar extension Tone and bulk:normal tone throughout; no atrophy noted Sensory: Pinprick and light touch intact throughout, bilaterally Deep Tendon Reflexes: 2+ and symmetric throughout Plantars: Right: downgoing   Left: downgoing Cerebellar: normal finger-to-nose, and  normal heel-to-shin test Gait: deferred  Laboratory Studies:   Basic Metabolic Panel:  Recent Labs Lab 05/23/15 1956  NA 141  K 4.1  CL 105  GLUCOSE 100*  BUN 11  CREATININE 0.80    Liver Function Tests: No results for input(s): AST, ALT, ALKPHOS, BILITOT, PROT,  ALBUMIN in the last 168 hours. No results for input(s): LIPASE, AMYLASE in the last 168 hours. No results for input(s): AMMONIA in the last 168 hours.  CBC:  Recent Labs Lab 05/23/15 1956  HGB 13.3  HCT 39.0    Cardiac Enzymes: No results for input(s): CKTOTAL, CKMB, CKMBINDEX, TROPONINI in the last 168 hours.  BNP: Invalid input(s): POCBNP  CBG: No results for input(s): GLUCAP in the last 168 hours.  Microbiology: No results found for this or any previous visit.  Coagulation Studies: No results for input(s): LABPROT, INR in the last 72 hours.  Urinalysis: No results for input(s): COLORURINE, LABSPEC, PHURINE, GLUCOSEU, HGBUR, BILIRUBINUR, KETONESUR, PROTEINUR, UROBILINOGEN, NITRITE, LEUKOCYTESUR in the last 168 hours.  Invalid input(s): APPERANCEUR  Lipid Panel:  No results found for: CHOL, TRIG, HDL, CHOLHDL, VLDL, LDLCALC  HgbA1C: No results found for: HGBA1C  Urine Drug Screen:  No results found for: LABOPIA, COCAINSCRNUR, LABBENZ, AMPHETMU, THCU, LABBARB  Alcohol Level: No results for input(s): ETH in the last 168 hours.  Other results:  Imaging: Ct Head Wo Contrast  05/23/2015   CLINICAL DATA:  Right-sided weakness and slurred speech  EXAM: CT HEAD WITHOUT CONTRAST  TECHNIQUE: Contiguous axial images were obtained from the base of the skull through the vertex without intravenous contrast.  COMPARISON:  MR brain 02/23/2015  FINDINGS: There is no evidence of mass effect, midline shift, or extra-axial fluid collections. There is no evidence of a space-occupying lesion or intracranial hemorrhage. There is no evidence of a cortical-based area of acute infarction. There is an old left occipital lobe infarct. There is an old small left corona radiata infarct. There is generalized cerebral atrophy.  The ventricles and sulci are appropriate for the patient's age. The basal cisterns are patent.  Visualized portions of the orbits are unremarkable. The visualized portions of the  paranasal sinuses and mastoid air cells are unremarkable. Cerebrovascular atherosclerotic calcifications are noted.  The osseous structures are unremarkable.  IMPRESSION: 1. No acute intracranial pathology. 2. Old left occipital lobe infarct. These results were called by telephone at the time of interpretation on 05/23/2015 at 7:52 pm to Dr. Dr. Janann Colonel, who verbally acknowledged these results.   Electronically Signed   By: Kathreen Devoid   On: 05/23/2015 19:57    Assessment: 54 y.o. male hx of known vascular disease presenting with recurrent transient episodes of speech deficits and right sided weakness. Currently with mild dysarthria, otherwise symptoms resolved. Likely left MCA territory involvement based on symptoms. Concern for hypoperfusion in the setting of low blood pressure in a patient with known severe stenosis.   Case was discussed with neuro IR, Dr [name]. As he is clinically stable no indication for urgent intervention. Will plan for IR angiogram in the morning. Keep NPO for now.   LSW 1935 so remains in the tPA window until 1205am on 7/25.   Plan: 1. Receiving normal saline bolus, continue with IV fluids once completed 2. MRI of the brain. Will go for angiogram tomorrow morning so hold on MRA 3. PT consult, OT consult, Speech consult 4. Echocardiogram, lipid panel and hemoglobin A1c 6. Prophylactic therapy-ASA 81mg  and Plavix 75mg   7. Risk factor modification 8.  Telemetry monitoring 9. Frequent neuro checks 10. NPO for now with planned IR procedure in the morning 11. Suggest step down admission for closer monitoring   Addendum: Notified that patients blood pressure remains low after multiple NS boluses. He remains clinically stable. Suspect that blood pressures are reading low due to the presence of bilateral subclavian stenosis (left occluded, right 95% stenosis). Discussed with CCM, will hold on starting pressors at this time as he remains clinically stable. Will plan to admit  to neuro-ICU for close monitoring.    This patient is critically ill and at significant risk of neurological worsening, death and care requires constant monitoring of vital signs, hemodynamics,respiratory and cardiac monitoring,review of multiple databases, neurological assessment, discussion with family, other specialists and medical decision making of high complexity. I spent 45 inutes of neurocritical care time in the care of this patient.   Jim Like, DO [facility] [phone]  If 7pm- 7am, please page neurology on call as listed in [facility]. 05/23/2015, 8:11 PM

## 2015-05-24 ENCOUNTER — Inpatient Hospital Stay (HOSPITAL_COMMUNITY): Payer: Medicare Other

## 2015-05-24 ENCOUNTER — Encounter (HOSPITAL_COMMUNITY): Payer: Self-pay | Admitting: Radiology

## 2015-05-24 DIAGNOSIS — R471 Dysarthria and anarthria: Secondary | ICD-10-CM

## 2015-05-24 LAB — LIPID PANEL
Cholesterol: 235 mg/dL — ABNORMAL HIGH (ref 0–200)
HDL: 25 mg/dL — AB (ref >40)
LDL Cholesterol: 193 mg/dL — ABNORMAL HIGH (ref 0–99)
Total CHOL/HDL Ratio: 9.4 RATIO
Triglycerides: 87 mg/dL (ref <150)
VLDL: 17 mg/dL (ref 0–40)

## 2015-05-24 LAB — CG4 I-STAT (LACTIC ACID): LACTIC ACID, VENOUS: 0.72 mmol/L (ref 0.5–2.0)

## 2015-05-24 LAB — MRSA PCR SCREENING: MRSA by PCR: NEGATIVE

## 2015-05-24 MED ORDER — SODIUM CHLORIDE 0.9 % IV SOLN: INTRAVENOUS | Status: DC

## 2015-05-24 MED ORDER — MIDAZOLAM HCL 2 MG/2ML IJ SOLN
INTRAMUSCULAR | Status: AC
  Filled 2015-05-24: qty 2

## 2015-05-24 MED ORDER — ASPIRIN 81 MG PO CHEW
81.0000 mg | CHEWABLE_TABLET | Freq: Once | ORAL | Status: AC
  Administered 2015-05-24: 81 mg via ORAL
  Filled 2015-05-24: qty 1

## 2015-05-24 MED ORDER — SODIUM CHLORIDE 0.9 % IV SOLN
INTRAVENOUS | Status: DC
  Administered 2015-05-24 – 2015-05-25 (×2): via INTRAVENOUS

## 2015-05-24 MED ORDER — FENTANYL CITRATE (PF) 100 MCG/2ML IJ SOLN
INTRAMUSCULAR | Status: AC | PRN
  Administered 2015-05-24: 25 ug via INTRAVENOUS

## 2015-05-24 MED ORDER — FENTANYL CITRATE (PF) 100 MCG/2ML IJ SOLN
INTRAMUSCULAR | Status: AC
  Filled 2015-05-24: qty 2

## 2015-05-24 MED ORDER — HEPARIN SODIUM (PORCINE) 1000 UNIT/ML IJ SOLN
INTRAMUSCULAR | Status: AC
Start: 2015-05-24 — End: 2015-05-24
  Filled 2015-05-24: qty 1

## 2015-05-24 MED ORDER — CLOPIDOGREL BISULFATE 75 MG PO TABS
75.0000 mg | ORAL_TABLET | Freq: Every day | ORAL | Status: DC
  Administered 2015-05-24 – 2015-05-26 (×3): 75 mg via ORAL
  Filled 2015-05-24 (×3): qty 1

## 2015-05-24 MED ORDER — MIDAZOLAM HCL 2 MG/2ML IJ SOLN
INTRAMUSCULAR | Status: AC | PRN
  Administered 2015-05-24: 0.5 mg via INTRAVENOUS

## 2015-05-24 MED ORDER — LIDOCAINE HCL 1 % IJ SOLN
INTRAMUSCULAR | Status: AC
Start: 2015-05-24 — End: 2015-05-24
  Filled 2015-05-24: qty 20

## 2015-05-24 MED ORDER — LIDOCAINE HCL 1 % IJ SOLN
INTRAMUSCULAR | Status: AC
  Filled 2015-05-24: qty 20

## 2015-05-24 MED ORDER — IOHEXOL 300 MG/ML  SOLN
150.0000 mL | Freq: Once | INTRAMUSCULAR | Status: AC | PRN
  Administered 2015-05-24: 50 mL via INTRAVENOUS

## 2015-05-24 NOTE — Progress Notes (Signed)
Reason for consult: Known cerebrovascular disease with new neurologic sxs  Referring Physician(s): Dr. Janann Colonel  History of Present Illness: Raymond Carney is a 54 y.o. 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 He has had cerebral angio in the past, 3/16. He is taking ASA and Plavix as directed, but recently developed new onset speech deficits and right sided weakness that resolved after about 15 minutes. He has been admitted for workup and observation. Dr. [name] is asked to consult. Chart, PMHx, prior imaging, labs, meds reviewed. Pt feeling ok this am. No current c/o.  Past Medical History  Diagnosis Date  . COPD (chronic obstructive pulmonary disease)   . CVD (cerebrovascular disease)   . Subclavian artery stenosis, right   . Stenosis of right vertebral artery     Allergies: Review of patient's allergies indicates no known allergies.  Medications:  Current facility-administered medications:  .  0.9 %  sodium chloride infusion, , Intravenous, Continuous, Drema Dallas, DO, Last Rate: 75 mL/hr at 05/24/15 0800 .  clopidogrel (PLAVIX) tablet 75 mg, 75 mg, Oral, Daily, Drema Dallas, DO, 75 mg at 05/24/15 0940 .  enoxaparin (LOVENOX) injection 40 mg, 40 mg, Subcutaneous, Q24H, Drema Dallas, DO .  senna-docusate (Senokot-S) tablet 1 tablet, 1 tablet, Oral, QHS PRN, Drema Dallas, DO    No family history on file.  History   Social History  . Marital Status: Single    Spouse Name: N/A  . Number of Children: N/A  . Years of Education: N/A   Social History Main Topics  . Smoking status: Not on file  . Smokeless tobacco: Not on file  . Alcohol Use: Not on file  . Drug Use: Not on file  . Sexual Activity: Not on file   Other Topics Concern  . Not on file   Social History Narrative    Review of Systems: A 12 point ROS discussed and pertinent positives are indicated in the HPI above.   All other systems are negative.  Review of Systems  Vital Signs: BP 64/48 mmHg  Pulse 51  Temp(Src) 98.6 F (37 C) (Oral)  Resp 13  Ht 5\' 9"  (1.753 m)  Wt 144 lb 10 oz (65.6 kg)  BMI 21.35 kg/m2  SpO2 99%  Physical Exam  Constitutional: He is oriented to person, place, and time. He appears well-developed and well-nourished. No distress.  HENT:  Mouth/Throat: Oropharynx is clear and moist.  Neck: Normal range of motion. No JVD present. No tracheal deviation present.  Cardiovascular: Normal rate, regular rhythm, normal heart sounds and intact distal pulses.   Pulmonary/Chest: Effort normal and breath sounds normal. No respiratory distress.  Neurological: He is alert and oriented to person, place, and time. No cranial nerve deficit.  Psychiatric: He has a normal mood and affect. Judgment normal.     Imaging: Ct Head Wo Contrast  05/23/2015   CLINICAL DATA:  Right-sided weakness and slurred speech  EXAM: CT HEAD WITHOUT CONTRAST  TECHNIQUE: Contiguous axial images were obtained from the base of the skull through the vertex without intravenous contrast.  COMPARISON:  MR brain 02/23/2015  FINDINGS: There is no evidence of mass effect, midline shift, or extra-axial fluid collections. There is no evidence of a space-occupying lesion or intracranial hemorrhage. There is no evidence of a cortical-based area of acute infarction. There is an old left occipital lobe infarct. There is an old small left corona radiata  infarct. There is generalized cerebral atrophy.  The ventricles and sulci are appropriate for the patient's age. The basal cisterns are patent.  Visualized portions of the orbits are unremarkable. The visualized portions of the paranasal sinuses and mastoid air cells are unremarkable. Cerebrovascular atherosclerotic calcifications are noted.  The osseous structures are unremarkable.  IMPRESSION: 1. No acute intracranial pathology. 2. Old left occipital lobe infarct. These results were  called by telephone at the time of interpretation on 05/23/2015 at 7:52 pm to Dr. Dr. Janann Colonel, who verbally acknowledged these results.   Electronically Signed   By: Kathreen Devoid   On: 05/23/2015 19:57   Mr Brain Wo Contrast  05/23/2015   CLINICAL DATA:  Initial evaluation for recurrent transient episodes of speech deficits with right-sided weakness.  EXAM: MRI HEAD WITHOUT CONTRAST  TECHNIQUE: Multiplanar, multiecho pulse sequences of the brain and surrounding structures were obtained without intravenous contrast.  COMPARISON:  Previous MRI from 02/23/2015 as well as prior CT performed earlier on the same day.  FINDINGS: Abnormal restricted diffusion involves the cortical gray matter of the anterior left temporal lobe, primarily involving the left superior temporal gyrus (series 4, image 20). Associated signal loss seen on corresponding ADC map, consistent with acute ischemic infarct. No associated hemorrhage or significant mass effect. No other areas of acute infarction identified.  Continued interval evolution about previously identified subacute left occipital lobe infarct. There is associated encephalomalacia of with laminar necrosis within this region. Additional probable subacute ischemic infarcts within the periventricular and deep white matter of the left cerebral hemisphere. These findings were seen on prior MRI. More chronic appearing lacunar infarct within the left corona radiata. No acute or chronic intracranial hemorrhage. Diminished vascular flow void within the left internal carotid artery and left middle cerebral artery, similar to previous studies. Somewhat diminutive vertebrobasilar system.  No mass lesion, midline shift, or mass effect. Mild chronic microvascular ischemic changes present within the periventricular No hydrocephalus. No extra-axial fluid collection.  Craniocervical junction within normal limits. Postoperative changes again noted within the sella.  No acute abnormality about the  orbits.  Scattered mucosal thickening within the ethmoidal air cells. Paranasal sinuses are otherwise clear. Small amount of opacity present within the posterior right mastoid air cells. Inner ear structures normal.  Bone marrow signal intensity within normal limits. No scalp soft tissue abnormality.  IMPRESSION: 1. Acute ischemic nonhemorrhagic cortical infarction involving the anterior left temporal lobe without significant mass effect. 2. Continued interval evolution of subacute to chronic left occipital, left periventricular, and left deep white matter infarcts in the left MCA territory. 3. Diminished flow void within the visualized distal left internal carotid artery as well as the left MCA, similar to previous study.   Electronically Signed   By: Jeannine Boga M.D.   On: 05/23/2015 23:55    Labs:  CBC:  Recent Labs  01/08/15 1152 05/23/15 1956 05/23/15 2004 05/23/15 2140  WBC 10.1  --  8.1 9.7  HGB 13.3 13.3 13.3 12.1*  HCT 41.1 39.0 40.5 37.1*  PLT 326  --  246 232    COAGS:  Recent Labs  01/08/15 1152 01/08/15 1154 05/23/15 2004  INR 1.09  --  1.06  APTT  --  34 29    BMP:  Recent Labs  01/08/15 1152 02/23/15 1144 05/23/15 1956 05/23/15 2004  NA 144  --  141 132*  K 3.7  --  4.1 3.8  CL 102  --  105 99*  CO2 26  --   --  24  GLUCOSE 94  --  100* 102*  BUN <5*  --  11 9  CALCIUM 9.6  --   --  8.4*  CREATININE 0.93 0.90 0.80 0.79  GFRNONAA >90  --   --  >60  GFRAA >90  --   --  >60    LIVER FUNCTION TESTS:  Recent Labs  05/23/15 2004  BILITOT 0.3  AST 19  ALT 15*  ALKPHOS 57  PROT 6.5  ALBUMIN 3.8    TUMOR MARKERS: No results for input(s): AFPTM, CEA, CA199, CHROMGRNA in the last 8760 hours.  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 Plan for cerebral arteriogram today. Explained procedure, risks, complications, use of sedation. Pt had same procedure  in3/16 and is very familiar with  All involved. Consent signed in chart  Thank you for this interesting consult.  I greatly enjoyed meeting Raymond Carney and look forward to participating in their care.  A copy of this report was sent to the requesting provider on this date.  SignedAscencion Dike 05/24/2015, 10:17 AM   I spent a total of 25 minutes in face to face in clinical consultation, greater than 50% of which was counseling/coordinating care for cerebral arteriogram.

## 2015-05-24 NOTE — Sedation Documentation (Signed)
Vital signs stable. 

## 2015-05-24 NOTE — Procedures (Signed)
S/P innominate artery arteriogram. RT CFDA approach Findings. 1.1.worsening RT subclavian artery stenosis90% ,and RT vert artery origin, 70 to 75% stenosis.

## 2015-05-24 NOTE — Sedation Documentation (Signed)
Patient is resting comfortably. 

## 2015-05-24 NOTE — Progress Notes (Signed)
STROKE TEAM PROGRESS NOTE   HISTORY Raymond Carney is an 54 y.o. 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 Presents with stuttering symptoms of speech difficulty and right sided weakness. Speech deficits described as dysarthria and an apparent expressive aphasia. Acute onset of symptoms around 1800, resolved around 1hr later and then returned around 1935, this time lasting around 15 minutes before mostly resolving again. (dysarthria remains). Reports taking ASA and Plavix at home.  CT head imaging reviewed, shows no acute process. Patient is followed by Dr [name] who has discussed possibility of endovascular revascularization. Patient last had IR angio on 01/08/2015. 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  Overall he feels his condition is gradually improving. He is scheduled for catheter angiogram today   OBJECTIVE Temp:  [97.5 F (36.4 C)-98.2 F (36.8 C)] 98.2 F (36.8 C) (07/25 0400) Pulse Rate:  [42-70] 45 (07/25 0700) Cardiac Rhythm:  [-] Sinus bradycardia (07/25 0700) Resp:  [8-26] 12 (07/25 0700) BP: (60-110)/(29-84) 68/45 mmHg (07/25 0700) SpO2:  [96 %-100 %] 98 % (07/25 0700) Weight:  [65.6 kg (144 lb 10 oz)] 65.6 kg (144 lb 10 oz) (07/25 0100)   Recent Labs Lab 05/23/15 2027  GLUCAP 98    Recent Labs Lab 05/23/15 1956 05/23/15 2004  NA 141 132*  K 4.1 3.8  CL  105 99*  CO2  --  24  GLUCOSE 100* 102*  BUN 11 9  CREATININE 0.80 0.79  CALCIUM  --  8.4*    Recent Labs Lab 05/23/15 2004  AST 19  ALT 15*  ALKPHOS 57  BILITOT 0.3  PROT 6.5  ALBUMIN 3.8    Recent Labs Lab 05/23/15 1956 05/23/15 2004 05/23/15 2140  WBC  --  8.1 9.7  NEUTROABS  --  4.7  --   HGB 13.3 13.3 12.1*  HCT 39.0 40.5 37.1*  MCV  --  85.4 86.3  PLT  --  246 232   No results for input(s): CKTOTAL, CKMB, CKMBINDEX, TROPONINI in the last 168 hours.  Recent Labs  05/23/15 2004  LABPROT 14.0  INR 1.06   No results for input(s): COLORURINE, LABSPEC, PHURINE, GLUCOSEU, HGBUR, BILIRUBINUR, KETONESUR, PROTEINUR, UROBILINOGEN, NITRITE, LEUKOCYTESUR in the last 72 hours.  Invalid input(s): APPERANCEUR     Component Value Date/Time   CHOL 235* 05/24/2015 0222   TRIG 87 05/24/2015 0222   HDL 25* 05/24/2015 0222   CHOLHDL 9.4 05/24/2015 0222   VLDL 17 05/24/2015 0222   LDLCALC 193* 05/24/2015 0222   No results found for: HGBA1C No results found for: LABOPIA, COCAINSCRNUR, LABBENZ, AMPHETMU, THCU, LABBARB  No results for input(s): ETH in the last 168 hours.   Imaging   Ct Head Wo Contrast 05/23/2015    1. No acute intracranial pathology.  2. Old left occipital lobe infarct.      Mr Brain Wo Contrast 05/23/2015    1. Acute ischemic nonhemorrhagic  cortical infarction involving the anterior left temporal lobe without significant mass effect.  2. Continued interval evolution of subacute to chronic left occipital, left periventricular, and left deep white matter infarcts in the left MCA territory.  3. Diminished flow void within the visualized distal left internal carotid artery as well as the left MCA, similar to previous study.        PHYSICAL EXAM Pleasant middle-aged Caucasian male currently not in distress. . Afebrile. Head is nontraumatic. Neck is supple with harsh right carotid bruit.    Cardiac exam no murmur or gallop. Lungs are clear to  auscultation. Distal pulses are absent in both upper extremities..  Neurological Exam :  Awake alert oriented 3. No aphasia and dysarthria can be easily understood. Follows commands well. Extraocular moments are full range without nystagmus. Slight left gaze preference but able to look past midline all the way. Fundi were not visualized. Vision acuity seems adequate. Mild right lower facial asymmetry. Tongue midline. Motor system exam no upper or lower extremity drift. Diminished fine finger movements on the right. Orbits left or right upper extremity. Minimum weakness of right grip and hip flexors only. No lower extremity drift. Sensation appears intact bilaterally. Coordination is slow but accurate. Gait was not tested.  ASSESSMENT/PLAN [name] is a 54 y.o. 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  He did not receive IV t-PA due to mild deficits.  Stroke:  Dominant infarct secondary to cerebral vascular disease.  Resultant  minimum dysarthria  MRI -  acute ischemic nonhemorrhagic cortical infarction involving the anterior left temporal lobe  MRA - not performed  Carotid Doppler - not performed  2D Echo - pending  LDL - 193  HgbA1c pending  Lovenox for VTE prophylaxis  Diet NPO time specified  aspirin 325 mg orally every day and clopidogrel 75 mg orally every day prior to admission, now on aspirin 81 mg orally every day and clopidogrel 75 mg orally every day  Patient counseled to be compliant with his antithrombotic medications  Ongoing aggressive stroke risk factor management  Therapy recommendations:  Pending  Disposition: Pending  Hypertension  Home meds: No antihypertensives medications prior to admission.  Hypotensive since admission-BP recording inaccurate due to absent pulses in  both UE   Hyperlipidemia  Home meds:  Lipitor 40 mg daily not resumed in hospital - currently NPO  LDL 193, goal < 70  Increase Lipitor to 80 mg daily when taking PO meds  Continue statin at discharge    Other Stroke Risk Factors  Cigarette smoker - not on file  ETOH use - not on file  Hx stroke/TIA  Family hx stroke - not on file    Other Active Problems  Hypotension  Cerebral angiogram scheduled for today    Other Pertinent History  COPD  Hospital day # [place] PA-C [facility] Pager [phone] 05/24/2015, 8:57 AM I have personally examined this patient, reviewed notes, independently viewed imaging studies, participated in medical decision making and plan of care. I have made any additions or clarifications directly to the above note. Agree with note above.  He presented with stuttering symptoms of speech difficulty and right-sided weakness secondary to a left MCA branch infarct likely due to failure of collaterals circulation in a patient with known chronic left common carotid and  subclavian artery occlusion. He remains at risk for neurological worsening, recurrent stroke and TIAs and needs ongoing stroke evaluation and aggressive risk factor control. He is scheduled for diagnostic cerebral catheter angiogram today to follow his known right subclavian and vertebral artery origin stenosis and is likely going to need angioplasty and stenting of those vessels soon.. Continue aspirin and Plavix for now.  Antony Contras, MD Medical Director [facility] Pager: [phone] 05/24/2015 3:11 PM     To contact Stroke Continuity provider, please refer to [email or web address] After hours, contact General Neurology

## 2015-05-24 NOTE — Progress Notes (Signed)
OT Cancellation Note  Patient Details Name: Raymond Carney MRN: [id number] DOB: Feb 24, 1961   Cancelled Treatment:    Reason Eval/Treat Not Completed: Patient not medically ready - Will reattempt tomorrow.   Darlina Rumpf [place], OTR/L [phone]  05/24/2015, 11:10 AM

## 2015-05-24 NOTE — Progress Notes (Signed)
eLink Physician-Brief Progress Note Patient Name: Raymond Carney DOB: 18-Aug-1961 MRN: [id number]   Date of Service  05/24/2015  HPI/Events of Note  64 M with PMH sign for known carotid and subclavian stenosis presenting with stroke symptoms of right sided weakness and speech difficulty.  Now in ICU for continued observation and plans for IR studies in AM.  Patient is alert with low BP and bradycardia.  Sats are 100% on RA.  eICU Interventions  Plan of care per primary neurology team Continue to monitor via [facility]     Intervention Category Evaluation Type: New Patient Evaluation  DETERDING,ELIZABETH 05/24/2015, 12:51 AM

## 2015-05-24 NOTE — Progress Notes (Signed)
PT Cancellation Note  Patient Details Name: LENZIE SANDLER MRN: [id number] DOB: 1961/01/15   Cancelled Treatment:    Reason Eval/Treat Not Completed: Patient not medically ready.  Still on bedrest. 05/24/2015  Donnella Sham, [place] [phone]  (pager)   Brendt Dible, Tessie Fass 05/24/2015, 5:12 PM

## 2015-05-24 NOTE — Progress Notes (Signed)
Dr. [name] said to hold pt's 1000 lovenox but OK to still give ASA and Plavix.

## 2015-05-25 ENCOUNTER — Ambulatory Visit (HOSPITAL_COMMUNITY): Payer: Medicare Other

## 2015-05-25 ENCOUNTER — Encounter (HOSPITAL_COMMUNITY): Payer: Self-pay | Admitting: *Deleted

## 2015-05-25 DIAGNOSIS — I708 Atherosclerosis of other arteries: Secondary | ICD-10-CM

## 2015-05-25 DIAGNOSIS — I639 Cerebral infarction, unspecified: Secondary | ICD-10-CM

## 2015-05-25 DIAGNOSIS — I6789 Other cerebrovascular disease: Secondary | ICD-10-CM

## 2015-05-25 LAB — HEMOGLOBIN A1C
HEMOGLOBIN A1C: 5.9 % — AB (ref 4.8–5.6)
MEAN PLASMA GLUCOSE: 123 mg/dL

## 2015-05-25 MED ORDER — ASPIRIN 325 MG PO TABS
325.0000 mg | ORAL_TABLET | Freq: Every day | ORAL | Status: DC
  Administered 2015-05-25 – 2015-05-26 (×2): 325 mg via ORAL
  Filled 2015-05-25 (×2): qty 1

## 2015-05-25 MED ORDER — CLOPIDOGREL BISULFATE 75 MG PO TABS: 75.0000 mg | ORAL_TABLET | Freq: Every day | ORAL | Status: DC

## 2015-05-25 MED ORDER — ATORVASTATIN CALCIUM 40 MG PO TABS
40.0000 mg | ORAL_TABLET | Freq: Every day | ORAL | Status: DC
  Administered 2015-05-25 – 2015-05-26 (×2): 40 mg via ORAL
  Filled 2015-05-25 (×3): qty 1

## 2015-05-25 MED ORDER — ALBUTEROL SULFATE (2.5 MG/3ML) 0.083% IN NEBU: 2.5000 mg | INHALATION_SOLUTION | RESPIRATORY_TRACT | Status: DC | PRN

## 2015-05-25 NOTE — Progress Notes (Signed)
STROKE TEAM PROGRESS NOTE   HISTORY Raymond Carney is an 54 y.o. 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 Presents with stuttering symptoms of speech difficulty and right sided weakness. Speech deficits described as dysarthria and an apparent expressive aphasia. Acute onset of symptoms around 1800, resolved around 1hr later and then returned around 1935, this time lasting around 15 minutes before mostly resolving again. (dysarthria remains). Reports taking ASA and Plavix at home.  CT head imaging reviewed, shows no acute process. Patient is followed by Dr [name] who has discussed possibility of endovascular revascularization. Patient last had IR angio on 01/08/2015. 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  Overall he feels his condition is gradually improving. He had catheter angiogram y`day which showed chronic left carotid and subclavian occlusion with 90% right subclavian and 70-75% right vertebral origin stenosis which appears to have progressed compared to prior study. Dr. [name] plans to do subclavian and vertebral angioplasty/stenting tomorrow and patient is agreeable.  OBJECTIVE Temp:  [98 F (36.7 C)-98.6 F (37 C)] 98.1 F (36.7 C) (07/26 0755) Pulse Rate:  [49-130] 57 (07/26 0900) Cardiac Rhythm:  [-] Sinus bradycardia (07/26 0800) Resp:  [8-23] 9 (07/26 0900) BP:  (64-140)/(26-108) 115/48 mmHg (07/26 0900) SpO2:  [90 %-100 %] 98 % (07/26 0900)   Recent Labs Lab 05/23/15 2027  GLUCAP 98    Recent Labs Lab 05/23/15 1956 05/23/15 2004  NA 141 132*  K 4.1 3.8  CL 105 99*  CO2  --  24  GLUCOSE 100* 102*  BUN 11 9  CREATININE 0.80 0.79  CALCIUM  --  8.4*    Recent Labs Lab 05/23/15 2004  AST 19  ALT 15*  ALKPHOS 57  BILITOT 0.3  PROT 6.5  ALBUMIN 3.8    Recent Labs Lab 05/23/15 1956 05/23/15 2004 05/23/15 2140  WBC  --  8.1 9.7  NEUTROABS  --  4.7  --   HGB 13.3 13.3 12.1*  HCT 39.0 40.5 37.1*  MCV  --  85.4 86.3  PLT  --  246 232   No results for input(s): CKTOTAL, CKMB, CKMBINDEX, TROPONINI in the last 168 hours.  Recent Labs  05/23/15 2004  LABPROT 14.0  INR 1.06   No results for input(s): COLORURINE, LABSPEC, PHURINE, GLUCOSEU, HGBUR, BILIRUBINUR, KETONESUR, PROTEINUR, UROBILINOGEN, NITRITE, LEUKOCYTESUR in the last 72 hours.  Invalid input(s): APPERANCEUR     Component Value Date/Time   CHOL 235* 05/24/2015 0222   TRIG 87 05/24/2015 0222   HDL 25* 05/24/2015 0222   CHOLHDL 9.4 05/24/2015 0222   VLDL 17 05/24/2015 0222   LDLCALC 193* 05/24/2015 0222   Lab Results  Component Value Date   HGBA1C 5.9* 05/24/2015   No results found for: LABOPIA, COCAINSCRNUR, LABBENZ, AMPHETMU, THCU, LABBARB  No results for input(s): ETH in the last 168 hours.   Imaging   Ct Head Wo Contrast 05/23/2015  1. No acute intracranial pathology.  2. Old left occipital lobe infarct.      Mr Brain Wo Contrast 05/23/2015    1. Acute ischemic nonhemorrhagic cortical infarction involving the anterior left temporal lobe without significant mass effect.  2. Continued interval evolution of subacute to chronic left occipital, left periventricular, and left deep white matter infarcts in the left MCA territory.  3. Diminished flow void within the visualized distal left internal carotid artery as well as the left MCA, similar to  previous study.     Cerebral catheter angio 05/24/15 : worsening RT subclavian artery stenosis90% ,and RT vert artery origin, 70 to 75% stenosis. Chronic left carotid and subclavian occlusion   PHYSICAL EXAM Pleasant middle-aged Caucasian male currently not in distress. . Afebrile. Head is nontraumatic. Neck is supple with harsh right carotid bruit.    Cardiac exam no murmur or gallop. Lungs are clear to auscultation. Distal pulses are absent in both upper extremities..  Neurological Exam :  Awake alert oriented 3. No aphasia and dysarthria can be easily understood. Follows commands well. Extraocular moments are full range without nystagmus. Slight left gaze preference but able to look past midline all the way. Fundi were not visualized. Vision acuity seems adequate. Mild right lower facial asymmetry. Tongue midline. Motor system exam no upper or lower extremity drift. Diminished fine finger movements on the right. Orbits left or right upper extremity. Minimum weakness of right grip and hip flexors only. No lower extremity drift. Sensation appears intact bilaterally. Coordination is slow but accurate. Gait was not tested.  ASSESSMENT/PLAN [name] is a 54 y.o. 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  He did not receive IV t-PA due to mild deficits.  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  Resultant  minimum dysarthria  MRI -  acute ischemic nonhemorrhagic cortical infarction involving the anterior left temporal lobe  MRA - not performed  Carotid Doppler - not performed  2D Echo - pending  LDL - 193  HgbA1c 5.9  Lovenox for VTE prophylaxis Diet Heart Room service appropriate?: Yes; Fluid consistency:: Thin  aspirin 325 mg orally every day and clopidogrel 75 mg  orally every day prior to admission, now on aspirin 81 mg orally every day and clopidogrel 75 mg orally every day  Patient counseled to be compliant with his antithrombotic medications  Ongoing aggressive stroke risk factor management  Therapy recommendations:  Pending  Disposition: Pending  Hypertension  Home meds: No antihypertensives medications prior to admission.  Hypotensive since admission-BP recording inaccurate due to absent pulses in both UE   Hyperlipidemia  Home meds:  Lipitor 40 mg daily not resumed in hospital -  LDL 193, goal < 70  Increase Lipitor to 80 mg daily when taking PO meds  Continue statin at discharge    Other Stroke Risk Factors  Cigarette smoker - not on file  ETOH use - not on file  Hx stroke/TIA  Family hx stroke - not on file    Other Active Problems  Hypotension       Other Pertinent History  COPD  Hospital day # 2       I have personally examined this patient, reviewed notes, independently viewed imaging studies, participated in medical decision making and plan of care. I have made any additions or  clarifications directly to the above note. Agree with note above.  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  Cerebral catheter angiogram shows progression of his known right subclavian and vertebral artery origin stenosis and is likely going to need angioplasty and stenting of those vessels tomorrow.. Continue aspirin and Plavix for now. Mobilize out of bed. Physical occupational therapy consults. Transfer to the floor bed today. Discussed with patient and Dr. Manfred Arch, MD Medical Director [place] Pager: [phone] 05/25/2015 9:55 AM     To contact Stroke Continuity provider,  please refer to [email or web address] After hours, contact General Neurology

## 2015-05-25 NOTE — Care Management Note (Signed)
Case Management Note  Patient Details  Name: Raymond Carney MRN: [id number] Date of Birth: [birth year]  Subjective/Objective: Pt admitted on 05/23/15 with speech difficulty and Rt sided weakness.  PTA, pt independent, lives with spouse.                    Action/Plan: PT eval today; recommending no OP follow up.  Will follow for discharge planning.    Expected Discharge Date:                  Expected Discharge Plan:  Home/Self Care  In-House Referral:     Discharge planning Services  CM Consult  Post Acute Care Choice:    Choice offered to:     DME Arranged:    DME Agency:     HH Arranged:    HH Agency:     Status of Service:  In process, will continue to follow  Medicare Important Message Given:    Date Medicare IM Given:    Medicare IM give by:    Date Additional Medicare IM Given:    Additional Medicare Important Message give by:     If discussed at [place] of Stay Meetings, dates discussed:    Additional Comments:  Reinaldo Raddle, RN, BSN  Trauma/Neuro ICU Case Manager [phone]

## 2015-05-25 NOTE — Evaluation (Signed)
Speech Language Pathology Evaluation Patient Details Name: Raymond Carney MRN: [id number] DOB: 11-08-1960 Today's Date: 05/25/2015 Time: 2924-4628 SLP Time Calculation (min) (ACUTE ONLY): 24 min  Problem List:  Patient Active Problem List   Diagnosis Date Noted  . CVA (cerebral infarction) 05/23/2015  . Dysarthria   . Right sided weakness   . TIA (transient ischemic attack)    Past Medical History:  Past Medical History  Diagnosis Date  . COPD (chronic obstructive pulmonary disease)   . CVD (cerebrovascular disease)   . Subclavian artery stenosis, right   . Stenosis of right vertebral artery    Past Surgical History: History reviewed. No pertinent past surgical history. HPI:  Raymond Carney is an 54 y.o. 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 Presents with stuttering symptoms of speech difficulty and right sided weakness. Speech deficits described as dysarthria and an apparent expressive aphasia.    Assessment / Plan / Recommendation Clinical Impression  Pt scored 14/22 on the MoCA-B, with deficits noted primarily with reduced storage/retrieval of new information, working memory, and selective attention. Pt believes that his performance is at his baseline level, and he lives with his parents who would be able to provide supervision for complex cognitive tasks upon initial return home. No acute needs identified.    SLP Assessment  Patient does not need any further Speech Lanaguage Pathology Services    Follow Up Recommendations  24 hour supervision/assistance    Pertinent Vitals/Pain Pain Assessment: No/denies pain   SLP Goals  Patient/Family Stated Goal: none stated  SLP Evaluation Prior Functioning  Cognitive/Linguistic Baseline: Information not available Type of Home: House  Lives With: Family (parents) Available Help at Discharge: Family;Available 24  hours/day Vocation: On disability   Cognition  Overall Cognitive Status: Impaired/Different from baseline Arousal/Alertness: Awake/alert Orientation Level: Oriented X4 Attention: Selective Selective Attention: Impaired Selective Attention Impairment: Verbal basic Memory: Impaired Memory Impairment: Storage deficit;Retrieval deficit;Decreased recall of new information Awareness: Impaired Awareness Impairment: Anticipatory impairment Problem Solving: Impaired Problem Solving Impairment: Verbal complex    Comprehension  Auditory Comprehension Overall Auditory Comprehension: Appears within functional limits for tasks assessed Visual Recognition/Discrimination Discrimination: Within Function Limits Reading Comprehension Reading Status: Unable to assess (comment) (does not have glasses)    Expression Expression Primary Mode of Expression: Verbal Verbal Expression Overall Verbal Expression: Appears within functional limits for tasks assessed Written Expression Dominant Hand: Right   Oral / Motor Motor Speech Overall Motor Speech: Appears within functional limits for tasks assessed    Germain Osgood, M.A. CCC-SLP [phone]  Germain Osgood 05/25/2015, 1:49 PM

## 2015-05-25 NOTE — Evaluation (Signed)
Occupational Therapy Evaluation and Discharge Patient Details Name: Raymond Carney MRN: [id number] DOB: 1961-04-07 Today's Date: 05/25/2015    History of Present Illness Raymond Carney is an 54 y.o. 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 Presents with stuttering symptoms of speech difficulty and right sided weakness. Speech deficits described as dysarthria and an apparent expressive aphasia.    Clinical Impression   This 54 yo male admitted with above presents to acute OT at a Mod I level--no further OT needs. I did advise him to not drive for now and to get his eyes rechecked in 3 months to allow for any change from his procedure he had while here in hospital. No further OT needs we will sign off.    Follow Up Recommendations  No OT follow up    Equipment Recommendations  None recommended by OT       Precautions / Restrictions Precautions Precautions: None Restrictions Weight Bearing Restrictions: No      Mobility Bed Mobility Overal bed mobility: Modified Independent                Transfers Overall transfer level: Modified independent                    Balance Overall balance assessment: Needs assistance Sitting-balance support: No upper extremity supported Sitting balance-Leahy Scale: Normal     Standing balance support: No upper extremity supported Standing balance-Leahy Scale: Good                              ADL Overall ADL's : Modified independent                                             Vision Vision Assessment?: Yes Eye Alignment: Within Functional Limits Ocular Range of Motion: Within Functional Limits Alignment/Gaze Preference: Within Defined Limits Tracking/Visual Pursuits:  (decreased attention towards end of testing, quit moving eyes. Re-grouped and he then completed task) Convergence:  Within functional limits Visual Fields: No apparent deficits Additional Comments: Has glasses but does not wear--says they do not help. Vision blurry in left eye per pt's report          Pertinent Vitals/Pain Pain Assessment: No/denies pain     Hand Dominance Right   Extremity/Trunk Assessment Upper Extremity Assessment Upper Extremity Assessment: Overall WFL for tasks assessed   Lower Extremity Assessment Lower Extremity Assessment: Overall WFL for tasks assessed (mild R LE proximal weakness and generally weaker)   Cervical / Trunk Assessment Cervical / Trunk Assessment:  (good truncal stength and righting reaction)   Communication Communication Communication: No difficulties   Cognition   Behavior During Therapy: WFL for tasks assessed/performed Overall Cognitive Status: Within Functional Limits for tasks assessed                                Home Living Family/patient expects to be discharged to:: Private residence Living Arrangements: Parent (with both parents)   Type of Home: House Home Access: Stairs to enter Technical brewer of Steps: 3 Entrance Stairs-Rails: Right;Left Home Layout: One level     Bathroom Shower/Tub: Walk-in shower;Tub/shower unit   Bathroom Toilet: Handicapped  height     Home Equipment: Walker - 2 wheels;Bedside commode;Shower seat;Wheelchair - power;Wheelchair - manual          Prior Functioning/Environment Level of Independence: Independent        Comments: Has been driving rarely due to poor vision    OT Diagnosis: Generalized weakness         OT Goals(Current goals can be found in the care plan section) Acute Rehab OT Goals Patient Stated Goal: to go home and be able to take care of his parents again  OT Frequency:             Co-evaluation PT/OT/SLP Co-Evaluation/Treatment: Yes Reason for Co-Treatment: For patient/therapist safety   OT goals addressed during session: ADL's and  self-care;Strengthening/ROM      End of Session Equipment Utilized During Treatment:  (None)  Activity Tolerance: Patient tolerated treatment well Patient left: in chair;with call bell/phone within reach   Time: 0950-1014 OT Time Calculation (min): 24 min Charges:  OT General Charges $OT Visit: 1 Procedure OT Evaluation $Initial OT Evaluation Tier I: 1 Procedure  Almon Register [phone] 05/25/2015, 11:59 AM

## 2015-05-25 NOTE — Progress Notes (Signed)
Report called to Nancee Liter RN. Patient currently getting echocardiogram done at the bedside. Will transfer patient upon completion. Daphne,RN aware .  Will continue to monitor. Roselyn Reef Rosibel Giacobbe,RN

## 2015-05-25 NOTE — Progress Notes (Signed)
Echocardiogram 2D Echocardiogram has been performed.  Tresa Res 05/25/2015, 12:30 PM

## 2015-05-25 NOTE — Evaluation (Signed)
Physical Therapy Evaluation Patient Details Name: Raymond Carney MRN: [id number] DOB: 08-Feb-1961 Today's Date: 05/25/2015   History of Present Illness  Raymond Carney is an 54 y.o. 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 Presents with stuttering symptoms of speech difficulty and right sided weakness. Speech deficits described as dysarthria and an apparent expressive aphasia.   Clinical Impression  Pt is at or close to baseline functioning and should be safe at home with available family assist. There are no further acute PT needs.  Will sign off at this time.     Follow Up Recommendations No PT follow up;Supervision - Intermittent    Equipment Recommendations  None recommended by PT    Recommendations for Other Services       Precautions / Restrictions Precautions Precautions: None Restrictions Weight Bearing Restrictions: No      Mobility  Bed Mobility Overal bed mobility: Modified Independent                Transfers Overall transfer level: Modified independent                  Ambulation/Gait Ambulation/Gait assistance: Independent Ambulation Distance (Feet): 170 Feet Assistive device: None Gait Pattern/deviations: Step-through pattern Gait velocity: moderate, likely age appropriate. Gait velocity interpretation: at or above normal speed for age/gender General Gait Details: mild stable hemiparetic gait; pt able to ambulate well with moderate challenge to balance incl scanning, change of direction, and mild speed change.  Stairs            Wheelchair Mobility    Modified Rankin (Stroke Patients Only) Modified Rankin (Stroke Patients Only) Pre-Morbid Rankin Score: No significant disability Modified Rankin: Slight disability     Balance Overall balance assessment: Needs assistance Sitting-balance support: No upper extremity  supported Sitting balance-Leahy Scale: Normal     Standing balance support: No upper extremity supported Standing balance-Leahy Scale: Good                               Pertinent Vitals/Pain Pain Assessment: No/denies pain    Home Living Family/patient expects to be discharged to:: Private residence Living Arrangements: Parent (with both parents)   Type of Home: House Home Access: Stairs to enter Entrance Stairs-Rails: Psychiatric nurse of Steps: 3 Home Layout: One level Home Equipment: Environmental consultant - 2 wheels;Bedside commode;Shower seat;Wheelchair - power;Wheelchair - manual      Prior Function Level of Independence: Independent         Comments: Has been driving rarely due to poor vision     Hand Dominance   Dominant Hand: Right    Extremity/Trunk Assessment   Upper Extremity Assessment: Defer to OT evaluation           Lower Extremity Assessment: Overall WFL for tasks assessed (mild R LE proximal weakness and generally weaker)      Cervical / Trunk Assessment:  (good truncal stength and righting reaction)  Communication   Communication: No difficulties  Cognition   Behavior During Therapy: WFL for tasks assessed/performed Overall Cognitive Status: Within Functional Limits for tasks assessed                      General Comments General comments (skin integrity, edema, etc.): VSS    Exercises        Assessment/Plan  PT Assessment Patent does not need any further PT services  PT Diagnosis     PT Problem List    PT Treatment Interventions     PT Goals (Current goals can be found in the Care Plan section) Acute Rehab PT Goals PT Goal Formulation: All assessment and education complete, DC therapy    Frequency     Barriers to discharge        Co-evaluation               End of Session   Activity Tolerance: Patient tolerated treatment well Patient left: in chair;with call bell/phone within  reach Nurse Communication: Mobility status         Time: 3818-4037 PT Time Calculation (min) (ACUTE ONLY): 24 min   Charges:   PT Evaluation $Initial PT Evaluation Tier I: 1 Procedure     PT G Codes:        Moxie Kalil, Tessie Fass 05/25/2015, 10:33 AM 05/25/2015  Donnella Sham, PT [phone]  (pager)

## 2015-05-26 ENCOUNTER — Inpatient Hospital Stay (HOSPITAL_COMMUNITY): Payer: Medicare Other | Admitting: Certified Registered Nurse Anesthetist

## 2015-05-26 ENCOUNTER — Other Ambulatory Visit: Payer: Self-pay | Admitting: Physician Assistant

## 2015-05-26 ENCOUNTER — Inpatient Hospital Stay (HOSPITAL_COMMUNITY): Payer: Medicare Other

## 2015-05-26 ENCOUNTER — Encounter (HOSPITAL_COMMUNITY)
Admission: EM | Disposition: A | Discharge status: Home / Self Care | Payer: Self-pay | Source: Home / Self Care | Attending: Neurology

## 2015-05-26 ENCOUNTER — Ambulatory Visit (HOSPITAL_COMMUNITY): Payer: Medicare Other

## 2015-05-26 ENCOUNTER — Encounter (HOSPITAL_COMMUNITY): Payer: Self-pay | Admitting: Certified Registered Nurse Anesthetist

## 2015-05-26 HISTORY — PX: RADIOLOGY WITH ANESTHESIA: SHX6223

## 2015-05-26 LAB — PLATELET INHIBITION P2Y12: Platelet Function  P2Y12: 157 [PRU] — ABNORMAL LOW (ref 194–418)

## 2015-05-26 LAB — POCT ACTIVATED CLOTTING TIME
ACTIVATED CLOTTING TIME: 190 s
Activated Clotting Time: 171 seconds

## 2015-05-26 LAB — GLUCOSE, CAPILLARY: GLUCOSE-CAPILLARY: 92 mg/dL (ref 65–99)

## 2015-05-26 SURGERY — RADIOLOGY WITH ANESTHESIA
Anesthesia: Monitor Anesthesia Care

## 2015-05-26 MED ORDER — HEPARIN SODIUM (PORCINE) 1000 UNIT/ML IJ SOLN
INTRAMUSCULAR | Status: DC | PRN
  Administered 2015-05-26: 3 mL via INTRAVENOUS
  Administered 2015-05-26: .5 mL via INTRAVENOUS

## 2015-05-26 MED ORDER — ASPIRIN 325 MG PO TABS
325.0000 mg | ORAL_TABLET | Freq: Every day | ORAL | Status: DC
  Administered 2015-05-27: 325 mg via ORAL
  Filled 2015-05-26 (×2): qty 1

## 2015-05-26 MED ORDER — SODIUM CHLORIDE 0.9 % IV SOLN
INTRAVENOUS | Status: DC | PRN
  Administered 2015-05-26: 14:00:00 via INTRAVENOUS

## 2015-05-26 MED ORDER — HYDROMORPHONE HCL 1 MG/ML IJ SOLN: 0.2500 mg | INTRAMUSCULAR | Status: DC | PRN

## 2015-05-26 MED ORDER — NITROGLYCERIN 1 MG/10 ML FOR IR/CATH LAB
INTRA_ARTERIAL | Status: AC
  Filled 2015-05-26: qty 10

## 2015-05-26 MED ORDER — CLOPIDOGREL BISULFATE 75 MG PO TABS
75.0000 mg | ORAL_TABLET | Freq: Every day | ORAL | Status: DC
Start: 2015-05-27 — End: 2015-05-27
  Administered 2015-05-27: 75 mg via ORAL
  Filled 2015-05-26 (×2): qty 1

## 2015-05-26 MED ORDER — CEFAZOLIN SODIUM-DEXTROSE 2-3 GM-% IV SOLR
INTRAVENOUS | Status: AC
  Filled 2015-05-26: qty 50

## 2015-05-26 MED ORDER — HEPARIN (PORCINE) IN NACL 100-0.45 UNIT/ML-% IJ SOLN
700.0000 [IU]/h | INTRAMUSCULAR | Status: DC
  Administered 2015-05-26: 500 [IU]/h via INTRAVENOUS
  Filled 2015-05-26: qty 250

## 2015-05-26 MED ORDER — ACETAMINOPHEN 500 MG PO TABS: 1000.0000 mg | ORAL_TABLET | Freq: Four times a day (QID) | ORAL | Status: DC | PRN

## 2015-05-26 MED ORDER — PROPOFOL 10 MG/ML IV BOLUS
INTRAVENOUS | Status: DC | PRN
  Administered 2015-05-26 (×2): 10 mg via INTRAVENOUS

## 2015-05-26 MED ORDER — LACTATED RINGERS IV SOLN
INTRAVENOUS | Status: DC
  Administered 2015-05-26: 11:00:00 via INTRAVENOUS

## 2015-05-26 MED ORDER — LIDOCAINE HCL 1 % IJ SOLN
INTRAMUSCULAR | Status: AC
  Filled 2015-05-26: qty 20

## 2015-05-26 MED ORDER — MIDAZOLAM HCL 5 MG/5ML IJ SOLN
INTRAMUSCULAR | Status: DC | PRN
  Administered 2015-05-26 (×2): 1 mg via INTRAVENOUS

## 2015-05-26 MED ORDER — ACETAMINOPHEN 650 MG RE SUPP: 650.0000 mg | Freq: Four times a day (QID) | RECTAL | Status: DC | PRN

## 2015-05-26 MED ORDER — CEFAZOLIN SODIUM-DEXTROSE 2-3 GM-% IV SOLR
2.0000 g | Freq: Three times a day (TID) | INTRAVENOUS | Status: DC
  Administered 2015-05-26 – 2015-05-27 (×4): 2 g via INTRAVENOUS
  Filled 2015-05-26 (×6): qty 50

## 2015-05-26 MED ORDER — NICARDIPINE HCL IN NACL 20-0.86 MG/200ML-% IV SOLN
5.0000 mg/h | INTRAVENOUS | Status: DC
  Filled 2015-05-26: qty 200

## 2015-05-26 MED ORDER — IOHEXOL 300 MG/ML  SOLN
250.0000 mL | Freq: Once | INTRAMUSCULAR | Status: AC | PRN
  Administered 2015-05-26: 100 mL via INTRAVENOUS

## 2015-05-26 MED ORDER — SODIUM CHLORIDE 0.9 % IV SOLN
INTRAVENOUS | Status: DC
  Administered 2015-05-26: 21:00:00 via INTRAVENOUS

## 2015-05-26 MED ORDER — FENTANYL CITRATE (PF) 100 MCG/2ML IJ SOLN
INTRAMUSCULAR | Status: DC | PRN
  Administered 2015-05-26 (×4): 25 ug via INTRAVENOUS

## 2015-05-26 MED ORDER — PROTAMINE SULFATE 10 MG/ML IV SOLN
INTRAVENOUS | Status: DC | PRN
  Administered 2015-05-26: 5 mg via INTRAVENOUS

## 2015-05-26 MED ORDER — PROMETHAZINE HCL 25 MG/ML IJ SOLN
6.2500 mg | INTRAMUSCULAR | Status: DC | PRN
Start: 2015-05-26 — End: 2015-05-26

## 2015-05-26 MED ORDER — SODIUM CHLORIDE 0.9 % IV BOLUS (SEPSIS)
500.0000 mL | Freq: Once | INTRAVENOUS | Status: AC
  Administered 2015-05-26: 500 mL via INTRAVENOUS

## 2015-05-26 MED ORDER — ONDANSETRON HCL 4 MG/2ML IJ SOLN: 4.0000 mg | Freq: Four times a day (QID) | INTRAMUSCULAR | Status: DC | PRN

## 2015-05-26 NOTE — Anesthesia Postprocedure Evaluation (Signed)
  Anesthesia Post-op Note  Patient: [name]  Procedure(s) Performed: Procedure(s): RADIOLOGY WITH ANESTHESIA/ANGIOPLASTY (N/A)  Patient Location: PACU  Anesthesia Type:MAC  Level of Consciousness: awake and alert   Airway and Oxygen Therapy: Patient Spontanous Breathing  Post-op Pain: none  Post-op Assessment: Post-op Vital signs reviewed LLE Motor Response: Purposeful movement LLE Sensation: Full sensation, No numbness, No pain, No tingling RLE Motor Response: Purposeful movement RLE Sensation: Full sensation, No numbness, No pain, No tingling      Post-op Vital Signs: stable  Last Vitals:  Filed Vitals:   05/26/15 1647  BP: 112/87  Pulse: 78  Temp:   Resp: 18    Complications: No apparent anesthesia complications

## 2015-05-26 NOTE — Care Management Important Message (Signed)
Important Message  Patient Details  Name: Raymond Carney MRN: [id number] Date of Birth: 09-08-1961   Medicare Important Message Given:  Yes-second notification given    Delorse Lek 05/26/2015, 12:30 PM

## 2015-05-26 NOTE — Procedures (Addendum)
S/P Rt subclavian,Rt vert artery and Rt common carotid arteriograms. RT CFA approach. Findings. 1.S/P RT subclavian angioplasty  prox to  RT Vert artery origin with modestly improved caliber C/W prior to angioplasty.

## 2015-05-26 NOTE — Progress Notes (Signed)
Pt needing to go to bathroom, groin site noted to be bleeding a lot, pressure held and DR [name] made aware

## 2015-05-26 NOTE — Progress Notes (Signed)
Spoke with DR [name], pressure held for 20 minutes, still able to doppler rt pedal pulse, pressure dressing and 10 lb sandbag applied

## 2015-05-26 NOTE — Progress Notes (Signed)
Patient's family made aware of patient's transfer to ICU. Patient's family took patient belongings to new floor

## 2015-05-26 NOTE — Progress Notes (Signed)
Patients BP trending low with poor radial and pedal pulses.Most recent BP 74/57 re taken manually now 90/58. Nurse reported finding to NP K. Government social research officer. Orders given to start 500cc one time bolus.

## 2015-05-26 NOTE — Progress Notes (Signed)
Patient ID: Raymond Carney, male   DOB: [birth year], 54 y.o.   MRN: [id number] Post procedure clinically stable.No new neurological symptoms or signs. VS BP in the Lt leg 90s to 120s/60s. HR SR  PaO2 95% plus. Rt groin soft No palpable hematoma. Distal pulses dopplerable. With no change.. DR [name] MD

## 2015-05-26 NOTE — Anesthesia Preprocedure Evaluation (Signed)
Anesthesia Evaluation  Patient identified by MRN, date of birth, ID band Patient awake    Reviewed: Allergy & Precautions, NPO status , Patient's Chart, lab work & pertinent test results  History of Anesthesia Complications Negative for: history of anesthetic complications  Airway Mallampati: II  TM Distance: >3 FB Neck ROM: Full    Dental  (+) Poor Dentition, Dental Advisory Given   Pulmonary COPDformer smoker,  breath sounds clear to auscultation        Cardiovascular + Peripheral Vascular Disease Rhythm:Regular Rate:Normal     Neuro/Psych TIACVA    GI/Hepatic negative GI ROS,   Endo/Other    Renal/GU negative Renal ROS     Musculoskeletal   Abdominal   Peds  Hematology   Anesthesia Other Findings   Reproductive/Obstetrics                             Anesthesia Physical Anesthesia Plan  ASA: III  Anesthesia Plan: MAC   Post-op Pain Management:    Induction: Intravenous  Airway Management Planned: Natural Airway and Simple Face Mask  Additional Equipment:   Intra-op Plan:   Post-operative Plan:   Informed Consent: I have reviewed the patients History and Physical, chart, labs and discussed the procedure including the risks, benefits and alternatives for the proposed anesthesia with the patient or authorized representative who has indicated his/her understanding and acceptance.   Dental advisory given  Plan Discussed with: CRNA and Surgeon  Anesthesia Plan Comments:         Anesthesia Quick Evaluation

## 2015-05-26 NOTE — Progress Notes (Signed)
Attempt to insert foley pre procedure, unable to advance catheter past prostate, applied condom cath per direction Dr. [name]

## 2015-05-26 NOTE — Progress Notes (Signed)
Transferred with CRNA to neuro PACU  R femoral groin site c/d/i. Report to floor RN and update on not to use pts R arm for blood pressure or labs, and to apply pink designated arm band on, also to do b/p on L leg only

## 2015-05-26 NOTE — Transfer of Care (Signed)
Immediate Anesthesia Transfer of Care Note  Patient: Raymond Carney  Procedure(s) Performed: Procedure(s): RADIOLOGY WITH ANESTHESIA/ANGIOPLASTY (N/A)  Patient Location: PACU  Anesthesia Type:MAC  Level of Consciousness: alert , oriented, patient cooperative and responds to stimulation  Airway & Oxygen Therapy: Patient Spontanous Breathing and Patient connected to nasal cannula oxygen  Post-op Assessment: Report given to RN, Post -op Vital signs reviewed and stable, Patient moving all extremities X 4 and Patient able to stick tongue midline  Post vital signs: Reviewed and stable  Last Vitals:  Filed Vitals:   05/26/15 1012  BP: 77/25  Pulse: 67  Temp: 36.6 C  Resp: 16    Complications: No apparent anesthesia complications

## 2015-05-26 NOTE — Progress Notes (Signed)
STROKE TEAM PROGRESS NOTE   HISTORY Raymond Carney is an 54 y.o. 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 Presents with stuttering symptoms of speech difficulty and right sided weakness. Speech deficits described as dysarthria and an apparent expressive aphasia. Acute onset of symptoms around 1800, resolved around 1hr later and then returned around 1935, this time lasting around 15 minutes before mostly resolving again. (dysarthria remains). Reports taking ASA and Plavix at home.  CT head imaging reviewed, shows no acute process. Patient is followed by Dr [name] who has discussed possibility of endovascular revascularization. Patient last had IR angio on 01/08/2015. 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  Overall he feels his condition is gradually improving.   Dr. [name] plans to do subclavian and vertebral angioplasty/stenting today and patient is agreeable.  OBJECTIVE Temp:  [97.7 F (36.5 C)-98.4 F (36.9 C)] 97.9 F (36.6 C) (07/27 1012) Pulse Rate:  [52-67] 67 (07/27 1012) Cardiac Rhythm:  [-]  Resp:  [14-18] 16 (07/27 1012) BP: (74-131)/(25-74) 77/25 mmHg (07/27 1012) SpO2:  [98 %-100 %] 99 % (07/27 1012)   Recent Labs Lab 05/23/15 2027  GLUCAP 98    Recent Labs Lab 05/23/15 1956 05/23/15 2004  NA 141 132*  K 4.1 3.8  CL 105 99*  CO2  --  24  GLUCOSE 100* 102*   BUN 11 9  CREATININE 0.80 0.79  CALCIUM  --  8.4*    Recent Labs Lab 05/23/15 2004  AST 19  ALT 15*  ALKPHOS 57  BILITOT 0.3  PROT 6.5  ALBUMIN 3.8    Recent Labs Lab 05/23/15 1956 05/23/15 2004 05/23/15 2140  WBC  --  8.1 9.7  NEUTROABS  --  4.7  --   HGB 13.3 13.3 12.1*  HCT 39.0 40.5 37.1*  MCV  --  85.4 86.3  PLT  --  246 232   No results for input(s): CKTOTAL, CKMB, CKMBINDEX, TROPONINI in the last 168 hours.  Recent Labs  05/23/15 2004  LABPROT 14.0  INR 1.06   No results for input(s): COLORURINE, LABSPEC, PHURINE, GLUCOSEU, HGBUR, BILIRUBINUR, KETONESUR, PROTEINUR, UROBILINOGEN, NITRITE, LEUKOCYTESUR in the last 72 hours.  Invalid input(s): APPERANCEUR     Component Value Date/Time   CHOL 235* 05/24/2015 0222   TRIG 87 05/24/2015 0222   HDL 25* 05/24/2015 0222   CHOLHDL 9.4 05/24/2015 0222   VLDL 17 05/24/2015 0222   LDLCALC 193* 05/24/2015 0222   Lab Results  Component Value Date   HGBA1C 5.9* 05/24/2015   No results found for: LABOPIA, COCAINSCRNUR, LABBENZ, AMPHETMU, THCU, LABBARB  No results for input(s): ETH in the last 168 hours.   Imaging   Ct Head Wo Contrast 05/23/2015    1. No acute intracranial pathology.  2. Old left occipital lobe infarct.      Mr Brain Wo Contrast 05/23/2015    1. Acute ischemic nonhemorrhagic cortical infarction involving  the anterior left temporal lobe without significant mass effect.  2. Continued interval evolution of subacute to chronic left occipital, left periventricular, and left deep white matter infarcts in the left MCA territory.  3. Diminished flow void within the visualized distal left internal carotid artery as well as the left MCA, similar to previous study.     Cerebral catheter angio 05/24/15 : worsening RT subclavian artery stenosis90% ,and RT vert artery origin, 70 to 75% stenosis. Chronic left carotid and subclavian occlusion   PHYSICAL EXAM Pleasant middle-aged Caucasian male  currently not in distress. . Afebrile. Head is nontraumatic. Neck is supple with harsh right carotid bruit.    Cardiac exam no murmur or gallop. Lungs are clear to auscultation. Distal pulses are absent in both upper extremities..  Neurological Exam :  Awake alert oriented 3. No aphasia and dysarthria can be easily understood. Follows commands well. Extraocular moments are full range without nystagmus.   Fundi were not visualized. Vision acuity seems adequate. Mild right lower facial asymmetry. Tongue midline. Motor system exam no upper or lower extremity drift. Diminished fine finger movements on the right. Orbits left or right upper extremity. Minimum weakness of right grip and hip flexors only. No lower extremity drift. Sensation appears intact bilaterally. Coordination is slow but accurate. Gait was not tested.  ASSESSMENT/PLAN [name] is a 54 y.o. 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  He did not receive IV t-PA due to mild deficits.  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  Resultant  minimum dysarthria  MRI -  acute ischemic nonhemorrhagic cortical infarction involving the anterior left temporal lobe  MRA - not performed  Carotid Doppler - not performed  2D Echo - pending  LDL - 193  HgbA1c 5.9  Lovenox for VTE prophylaxis Diet NPO time specified Except for: Sips with Meds  aspirin 325 mg orally every day and clopidogrel 75 mg orally every day prior to admission, now on aspirin 81 mg orally every day and clopidogrel 75 mg orally every day  Patient counseled to be compliant with his antithrombotic medications  Ongoing aggressive stroke risk factor management  Therapy recommendations:  Pending  Disposition: Pending  Hypertension  Home meds: No  antihypertensives medications prior to admission.  Hypotensive since admission-BP recording inaccurate due to absent pulses in both UE   Hyperlipidemia  Home meds:  Lipitor 40 mg daily not resumed in hospital -  LDL 193, goal < 70  Increase Lipitor to 80 mg daily when taking PO meds  Continue statin at discharge    Other Stroke Risk Factors  Cigarette smoker - not on file  ETOH use - not on file  Hx stroke/TIA  Family hx stroke - not on file    Other Active Problems  Hypotension       Other Pertinent History  COPD  Hospital day # 3       I have personally examined this patient, reviewed notes, independently viewed imaging studies, participated in medical decision making and plan of care. I have made any additions or clarifications directly to the above note. Agree with note above.  He presented with stuttering symptoms of speech difficulty and right-sided weakness secondary to a left MCA branch infarct likely due to failure of collaterals circulation in a patient with known chronic left  common carotid and subclavian artery occlusion.  Cerebral catheter angiogram shows progression of his known right subclavian and vertebral artery origin stenosis and is likely going to need angioplasty and stenting of those vessels tomorrow.. Continue aspirin and Plavix for now. For elective right subclavian angioplasty/stenting today Antony Contras, MD Medical Director [facility] Pager: [phone] 05/26/2015 1:07 PM     To contact Stroke Continuity provider, please refer to [email or web address] After hours, contact General Neurology

## 2015-05-26 NOTE — Progress Notes (Signed)
Received a call from radiology stating that Dr [name] does want pt to have morning doses of Plavix and Aspirin.  Spoke with Claiborne Billings, pts nurse from the floor and she states she held them this morning but will tube them to me to give pt.

## 2015-05-26 NOTE — Progress Notes (Signed)
ANTICOAGULATION CONSULT NOTE - Initial Consult  Pharmacy Consult for heparin Indication: Post interventional radiology procedure  No Known Allergies  Patient Measurements: Height: 5\' 9"  (175.3 cm) Weight: 144 lb 10 oz (65.6 kg) IBW/kg (Calculated) : 70.7 Heparin Dosing Weight: 65kg  Vital Signs: Temp: 97.5 F (36.4 C) (07/27 1615) Temp Source: Oral (07/27 1012) BP: 112/87 mmHg (07/27 1647) Pulse Rate: 78 (07/27 1647)  Labs:  Recent Labs  05/23/15 1956 05/23/15 2004 05/23/15 2140  HGB 13.3 13.3 12.1*  HCT 39.0 40.5 37.1*  PLT  --  246 232  APTT  --  29  --   LABPROT  --  14.0  --   INR  --  1.06  --   CREATININE 0.80 0.79  --     Estimated Creatinine Clearance: 96.8 mL/min (by C-G formula based on Cr of 0.79).   Medical History: Past Medical History  Diagnosis Date  . COPD (chronic obstructive pulmonary disease)   . CVD (cerebrovascular disease)   . Subclavian artery stenosis, right   . Stenosis of right vertebral artery     Medications:  Scheduled:  . [MAR Hold] aspirin  325 mg Oral Daily  . [MAR Hold] atorvastatin  40 mg Oral QHS  . ceFAZolin      .  ceFAZolin (ANCEF) IV  2 g Intravenous 3 times per day  . [MAR Hold] clopidogrel  75 mg Oral Daily  . lidocaine       Infusions:  . sodium chloride 75 mL/hr at 05/25/15 2312  . sodium chloride    . sodium chloride    . heparin 500 Units/hr (05/26/15 1627)  . lactated ringers 10 mL/hr at 05/26/15 1121  . niCARDipine      Assessment: 54 year old man s/p right subclavian angioplasty to receive IV heparin until 0700 tomorrow. Goal of Therapy:  Heparin level 0.1-0.25 units/ml Monitor platelets by anticoagulation protocol: Yes   Plan:  Start heparin infusion at 500 units/hr Check anti-Xa level in 6 hours and daily while on heparin Continue to monitor H&H and platelets  Candie Mile 05/26/2015,5:13 PM

## 2015-05-27 ENCOUNTER — Encounter (HOSPITAL_COMMUNITY): Payer: Self-pay | Admitting: Interventional Radiology

## 2015-05-27 ENCOUNTER — Other Ambulatory Visit: Payer: Self-pay | Admitting: Radiology

## 2015-05-27 DIAGNOSIS — I771 Stricture of artery: Secondary | ICD-10-CM | POA: Insufficient documentation

## 2015-05-27 DIAGNOSIS — I63032 Cerebral infarction due to thrombosis of left carotid artery: Secondary | ICD-10-CM

## 2015-05-27 LAB — CBC WITH DIFFERENTIAL/PLATELET
Basophils Absolute: 0 10*3/uL (ref 0.0–0.1)
Basophils Relative: 0 % (ref 0–1)
Eosinophils Absolute: 0.2 10*3/uL (ref 0.0–0.7)
Eosinophils Relative: 3 % (ref 0–5)
HCT: 35.6 % — ABNORMAL LOW (ref 39.0–52.0)
Hemoglobin: 11.7 g/dL — ABNORMAL LOW (ref 13.0–17.0)
Lymphocytes Relative: 34 % (ref 12–46)
Lymphs Abs: 2.7 10*3/uL (ref 0.7–4.0)
MCH: 28.1 pg (ref 26.0–34.0)
MCHC: 32.9 g/dL (ref 30.0–36.0)
MCV: 85.4 fL (ref 78.0–100.0)
MONOS PCT: 8 % (ref 3–12)
Monocytes Absolute: 0.7 10*3/uL (ref 0.1–1.0)
NEUTROS ABS: 4.4 10*3/uL (ref 1.7–7.7)
NEUTROS PCT: 55 % (ref 43–77)
Platelets: 206 10*3/uL (ref 150–400)
RBC: 4.17 MIL/uL — ABNORMAL LOW (ref 4.22–5.81)
RDW: 13.7 % (ref 11.5–15.5)
WBC: 7.9 10*3/uL (ref 4.0–10.5)

## 2015-05-27 LAB — BASIC METABOLIC PANEL
Anion gap: 6 (ref 5–15)
BUN: 6 mg/dL (ref 6–20)
CO2: 25 mmol/L (ref 22–32)
Calcium: 8.4 mg/dL — ABNORMAL LOW (ref 8.9–10.3)
Chloride: 108 mmol/L (ref 101–111)
Creatinine, Ser: 0.69 mg/dL (ref 0.61–1.24)
GFR calc Af Amer: >60 mL/min (ref >60)
Glucose, Bld: 103 mg/dL — ABNORMAL HIGH (ref 65–99)
POTASSIUM: 3.5 mmol/L (ref 3.5–5.1)
SODIUM: 139 mmol/L (ref 135–145)

## 2015-05-27 LAB — HEPARIN LEVEL (UNFRACTIONATED): Heparin Unfractionated: <0.1 IU/mL — ABNORMAL LOW (ref 0.30–0.70)

## 2015-05-27 NOTE — Progress Notes (Signed)
Physical Therapy Treatment Patient Details Name: Raymond Carney MRN: [id number] DOB: Jul 01, 1961 Today's Date: 05/27/2015    History of Present Illness Raymond Carney is an 54 y.o. 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 Presents with stuttering symptoms of speech difficulty and right sided weakness. Speech deficits described as dysarthria and an apparent expressive aphasia.  Pt underwent successful Rt subclavian angioplasty 05/26/15 and PT/OT re-ordered post procedure.     PT Comments    Pt is safe for d/c home post stenting.  He remains safe with all mobility.  No further PT needs at this time.  Will sign off.  Follow Up Recommendations  No PT follow up;Supervision - Intermittent     Equipment Recommendations  None recommended by PT    Recommendations for Other Services       Precautions / Restrictions Precautions Precautions: None    Mobility  Bed Mobility                  Transfers Overall transfer level: Modified independent                  Ambulation/Gait Ambulation/Gait assistance: Independent Ambulation Distance (Feet): 200 Feet Assistive device: None Gait Pattern/deviations: Step-through pattern Gait velocity: functional speed    General Gait Details: stable gait, balance challenged without any deviation.   Stairs Stairs: Yes Stairs assistance: Modified independent (Device/Increase time) Stair Management: One rail Right;Alternating pattern;Forwards Number of Stairs: 5    Wheelchair Mobility    Modified Rankin (Stroke Patients Only) Modified Rankin (Stroke Patients Only) Modified Rankin: No significant disability     Balance Overall balance assessment: Needs assistance Sitting-balance support: Feet supported Sitting balance-Leahy Scale: Normal     Standing balance support: No upper extremity supported Standing balance-Leahy  Scale: Good                      Cognition Arousal/Alertness: Awake/alert Behavior During Therapy: WFL for tasks assessed/performed Overall Cognitive Status: Within Functional Limits for tasks assessed (family present and confirms pt at baseline )                      Exercises      General Comments General comments (skin integrity, edema, etc.): VSS      Pertinent Vitals/Pain Pain Assessment: No/denies pain    Home Living Family/patient expects to be discharged to:: Private residence Living Arrangements: Parent (with both parents) Available Help at Discharge: Family;Available 24 hours/day Type of Home: House Home Access: Stairs to enter Entrance Stairs-Rails: Right;Left Home Layout: One level Home Equipment: Environmental consultant - 2 wheels;Bedside commode;Shower seat;Wheelchair - power;Wheelchair - manual      Prior Function Level of Independence: Independent      Comments: Pt reports he has had vision changes over the past year and is seeing opthalmology.  He has limited his driving since then    PT Goals (current goals can now be found in the care plan section) Acute Rehab PT Goals Patient Stated Goal: to go home  PT Goal Formulation: All assessment and education complete, DC therapy    Frequency       PT Plan Current plan remains appropriate    Co-evaluation             End of Session   Activity Tolerance: Patient tolerated treatment well Patient left: in chair;with call bell/phone within reach;with  family/visitor present     Time: 5183-4373 PT Time Calculation (min) (ACUTE ONLY): 14 min  Charges:                       G Codes:      Aleph Nickson, Tessie Fass 05/27/2015, 2:49 PM 05/27/2015  Donnella Sham, [place] [phone]  (pager)

## 2015-05-27 NOTE — Discharge Instructions (Signed)
°Carotid Artery Disease °The carotid arteries are the two main arteries on either side of the neck that supply blood to the brain. Carotid artery disease, also called carotid artery stenosis, is the narrowing or blockage of one or both carotid arteries. Carotid artery disease increases your risk for a stroke or a transient ischemic attack (TIA). A TIA is an episode in which a waxy, fatty substance that accumulates within the artery (plaque) blocks blood flow to the brain. A TIA is considered a "warning stroke."  °CAUSES  °· Buildup of plaque inside the carotid arteries (atherosclerosis) (common). °· A weakened outpouching in an artery (aneurysm). °· Inflammation of the carotid artery (arteritis). °· A fibrous growth within the carotid artery (fibromuscular dysplasia). °· Tissue death within the carotid artery due to radiation treatment (post-radiation necrosis). °· Decreased blood flow due to spasms of the carotid artery (vasospasm). °· Separation of the walls of the carotid artery (carotid dissection). °RISK FACTORS °· High cholesterol (dyslipidemia).   °· High blood pressure (hypertension).   °· Smoking.   °· Obesity.   °· Diabetes.   °· Family history of cardiovascular disease.   °· Inactivity or lack of regular exercise.   °· Being male. Men have an increased risk of developing atherosclerosis earlier in life than women.   °SYMPTOMS  °Carotid artery disease does not cause symptoms. °DIAGNOSIS °Diagnosis of carotid artery disease may include:  °· A physical exam. Your health care provider may hear an abnormal sound (bruit) when listening to the carotid arteries.   °· Specific tests that look at the blood flow in the carotid arteries. These tests include:   °¨ Carotid artery ultrasonography.   °¨ Carotid or cerebral angiography.   °¨ Computerized tomographic angiography (CTA).   °¨ Magnetic resonance angiography (MRA).   °TREATMENT  °Treatment of carotid artery disease can include a combination of treatments.  Treatment options include: °· Surgery. You may have:   °¨ A carotid endarterectomy. This is a surgery to remove the blockages in the carotid arteries.   °¨ A carotid angioplasty with stenting. This is a nonsurgical interventional procedure. A wire mesh (stent) is used to widen the blocked carotid arteries.   °· Medicines to control blood pressure, cholesterol, and reduce blood clotting (antiplatelet therapy).   °· Adjusting your diet.   °· Lifestyle changes such as:   °¨ Quitting smoking.   °¨ Exercising as tolerated or as directed by your health care provider.   °¨ Controlling and maintaining a good blood pressure.   °¨ Keeping cholesterol levels under control.   °HOME CARE INSTRUCTIONS  °· Take medicines only as directed by your health care provider. Make sure you understand all your medicine instructions. Do not stop your medicines without talking to your health care provider.   °· Follow your health care provider's diet instructions. It is important to eat a healthy diet that is low in saturated fats and includes plenty of fresh fruits, vegetables, and lean meats. High-fat, high-sodium foods as well as foods that are fried, overly processed, or have poor nutritional value should be avoided. °· Maintain a healthy weight.   °· Stay physically active. It is recommended that you get at least 30 minutes of activity every day.   °· Do not use any tobacco products including cigarettes, chewing tobacco, or electronic cigarettes. If you need help quitting, ask your health care provider. °· Limit alcohol use to:   °¨ No more than 2 drinks per day for men.   °¨ No more than 1 drink per day for nonpregnant women.   °· Do not use illegal drugs.   °· Keep all follow-up visits as directed by your health   care provider.  SEEK IMMEDIATE MEDICAL CARE IF:  You develop TIA or stroke symptoms. These include:   Sudden weakness or numbness on one side of the body, such as in the face, arm, or leg.   Sudden confusion.    Trouble speaking (aphasia) or understanding.   Sudden trouble seeing out of one or both eyes.   Sudden trouble walking.   Dizziness or feeling like you might faint.   Loss of balance or coordination.   Sudden severe headache with no known cause.   Sudden trouble swallowing (dysphagia).  If you have any of these symptoms, call your local emergency services (911 in [place] Do not drive yourself to the clinic or hospital. This is a medical emergency.  Document Released: 01/08/2012 Document Revised: 03/02/2014 Document Reviewed: 04/16/2013 [facility] Patient Information 2015 [place], [place]. This information is not intended to replace advice given to you by your health care provider. Make sure you discuss any questions you have with your health care provider.

## 2015-05-27 NOTE — Progress Notes (Signed)
STROKE TEAM PROGRESS NOTE   HISTORY Raymond Carney is an 54 y.o. 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 Presents with stuttering symptoms of speech difficulty and right sided weakness. Speech deficits described as dysarthria and an apparent expressive aphasia. Acute onset of symptoms around 1800, resolved around 1hr later and then returned around 1935, this time lasting around 15 minutes before mostly resolving again. (dysarthria remains). Reports taking ASA and Plavix at home.  CT head imaging reviewed, shows no acute process. Patient is followed by Dr [name] who has discussed possibility of endovascular revascularization. Patient last had IR angio on 01/08/2015. 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  Overall he feels his condition is improved.   Dr. [name] did right subclavian  Angioplasty yesterday with modest opening up of the vertebral artery the ostial but was not able to place a stent. .  OBJECTIVE Temp:  [97 F (36.1 C)-98.1 F (36.7 C)] 98.1 F (36.7 C) (07/28 1119) Pulse Rate:  [43-104] 50 (07/28 1220) Cardiac Rhythm:  [-] Sinus bradycardia (07/27 2000) Resp:  [6-26] 12 (07/28 1220) BP: (69-112)/(32-87) 91/48 mmHg (07/28 1220) SpO2:  [88 %-100 %] 100 % (07/28 1220)   Recent Labs Lab 05/23/15 2027 05/26/15 1034  GLUCAP 98 92    Recent Labs Lab 05/23/15 1956  05/23/15 2004 05/27/15 0113  NA 141 132* 139  K 4.1 3.8 3.5  CL 105 99* 108  CO2  --  24 25  GLUCOSE 100* 102* 103*  BUN 11 9 6   CREATININE 0.80 0.79 0.69  CALCIUM  --  8.4* 8.4*    Recent Labs Lab 05/23/15 2004  AST 19  ALT 15*  ALKPHOS 57  BILITOT 0.3  PROT 6.5  ALBUMIN 3.8    Recent Labs Lab 05/23/15 1956 05/23/15 2004 05/23/15 2140 05/27/15 0113  WBC  --  8.1 9.7 7.9  NEUTROABS  --  4.7  --  4.4  HGB 13.3 13.3 12.1* 11.7*  HCT 39.0 40.5 37.1* 35.6*  MCV  --  85.4 86.3 85.4  PLT  --  246 232 206   No results for input(s): CKTOTAL, CKMB, CKMBINDEX, TROPONINI in the last 168 hours. No results for input(s): LABPROT, INR in the last 72 hours. No results for input(s): COLORURINE, LABSPEC, [place], GLUCOSEU, HGBUR, BILIRUBINUR, KETONESUR, PROTEINUR, UROBILINOGEN, NITRITE, LEUKOCYTESUR in the last 72 hours.  Invalid input(s): APPERANCEUR     Component Value Date/Time   CHOL 235* 05/24/2015 0222   TRIG 87 05/24/2015 0222   HDL 25* 05/24/2015 0222   CHOLHDL 9.4 05/24/2015 0222   VLDL 17 05/24/2015 0222   LDLCALC 193* 05/24/2015 0222   Lab Results  Component Value Date   HGBA1C 5.9* 05/24/2015   No results found for: LABOPIA, COCAINSCRNUR, LABBENZ, AMPHETMU, THCU, LABBARB  No results for input(s): ETH in the last 168 hours.   Imaging   Ct Head Wo Contrast 05/23/2015  1. No acute intracranial pathology.  2. Old left occipital lobe infarct.      Mr Brain Wo Contrast 05/23/2015    1. Acute ischemic nonhemorrhagic cortical infarction involving the anterior left temporal lobe without significant mass effect.  2. Continued interval evolution of subacute to chronic left occipital, left periventricular, and left deep white matter infarcts in the left MCA territory.  3. Diminished flow void within the visualized distal left internal carotid artery as well as the left MCA, similar to previous study.     Cerebral catheter angio 05/24/15 : worsening RT  subclavian artery stenosis90% ,and RT vert artery origin, 70 to 75% stenosis. Chronic left carotid and subclavian occlusion 05/26/2015 IR Procedure : RT subclavian angioplasty prox to RT Vert artery origin with modestly improved caliber C/W prior to angioplasty.  PHYSICAL EXAM Pleasant middle-aged Caucasian male currently not in distress. . Afebrile. Head is nontraumatic. Neck is supple with harsh right carotid bruit.    Cardiac exam no murmur or gallop. Lungs are clear to auscultation. Distal pulses are absent in both upper extremities..  Neurological Exam :  Awake alert oriented 3. No aphasia and dysarthria can be easily understood. Follows commands well. Extraocular moments are full range without nystagmus.   Fundi were not visualized. Vision acuity seems adequate. Mild right lower facial asymmetry. Tongue midline. Motor system exam no upper or lower extremity drift. Diminished fine finger movements on the right. Orbits left or right upper extremity. Minimum weakness of right grip and hip flexors only. No lower extremity drift. Sensation appears intact bilaterally. Coordination is slow but accurate. Gait was not tested.  ASSESSMENT/PLAN Raymond Carney is a 54 y.o. 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  He did not receive IV t-PA due to mild deficits.  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  Resultant  minimum dysarthria  MRI -  acute ischemic nonhemorrhagic cortical infarction involving the anterior left temporal lobe  MRA - not performed  Carotid Doppler - not performed  2D Echo - pending  LDL - 193  HgbA1c 5.9  Lovenox for VTE prophylaxis Diet Heart Room service appropriate?: Yes; Fluid consistency:: Thin Diet - low sodium heart healthy  aspirin 325 mg  orally every day and clopidogrel 75 mg orally every day prior to admission, now on aspirin 81 mg orally every day and clopidogrel 75 mg orally every day  Patient counseled to be compliant with his antithrombotic medications  Ongoing aggressive stroke risk factor management  Therapy recommendations:  Pending  Disposition: Pending  Hypertension  Home meds: No antihypertensives medications prior to admission.  Hypotensive since admission-BP recording inaccurate due to absent pulses in both UE   Hyperlipidemia  Home meds:  Lipitor 40 mg daily not resumed in hospital -  LDL 193, goal < 70  Increase Lipitor to 80 mg daily when taking PO meds  Continue statin at discharge    Other Stroke Risk Factors  Cigarette smoker - not on file  ETOH use - not on file  Hx stroke/TIA  Family hx stroke - not on file    Other Active Problems  Hypotension       Other Pertinent History  COPD  Hospital day # 4       I have personally examined this patient, reviewed notes, independently viewed imaging studies,  participated in medical decision making and plan of care. I have made any additions or clarifications directly to the above note. Agree with note above.  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  He underwent successful right subclavian angioplasty but a stent could not be placed... Continue aspirin and Plavix and statin. Patient counseled to quit smoking. Mobilize out of bed. Plan to discharge home later today. Follow-up as an outpatient with Dr. Willaim Rayas in 2 months and Dr. [name] in 2 weeks.  Antony Contras, MD Medical Director [facility] Pager: [phone] 05/27/2015 1:40 PM     To contact Stroke Continuity provider, please refer to  [email or web address] After hours, contact General Neurology

## 2015-05-27 NOTE — Progress Notes (Addendum)
ANTICOAGULATION CONSULT NOTE - Follow-up Consult  Pharmacy Consult for heparin Indication: Post interventional radiology procedure  No Known Allergies  Patient Measurements: Height: 5\' 9"  (175.3 cm) Weight: 144 lb 10 oz (65.6 kg) IBW/kg (Calculated) : 70.7 Heparin Dosing Weight: 65kg  Vital Signs: Temp: 97.7 F (36.5 C) (07/28 0000) Temp Source: Oral (07/28 0000) BP: 99/71 mmHg (07/28 0000) Pulse Rate: 50 (07/28 0000)  Labs:  Recent Labs  05/27/15 0113  HGB 11.7*  HCT 35.6*  PLT 206  HEPARINUNFRC <0.10*  CREATININE 0.69    Estimated Creatinine Clearance: 96.8 mL/min (by C-G formula based on Cr of 0.69).   Assessment: 54 year old man s/p right subclavian angioplasty to receive IV heparin until 0700 tomorrow. Initial heparin level undetectable on 500 units/hr. Noted bleeding from site yesterday ~1800 requiring sandbag/pressure. Heparin was held for about an hour and restarted at 1930. No issues with line or further bleeding per RN. CBC stable.  Goal of Therapy:  Heparin level 0.1-0.25 units/ml Monitor platelets by anticoagulation protocol: Yes   Plan:  Increase heparin to 700 units/hr - will end at 0700 No further levels  Sherlon Handing, PharmD, BCPS Clinical pharmacist, pager [phone] 05/27/2015,2:05 AM

## 2015-05-27 NOTE — Progress Notes (Signed)
Referring Physician(s): Dr. Leonie Man  Chief Complaint: Cerebrovascular disease with known high grade stenosis of the right subclavian and vertebral arteries. S/p successful RT subclavian angioplastyprox toRT Vert artery origin  Subjective: Patient doing well, denies any headache, lightheadedness, dizziness, vision or speech changes. He denies any extremity weakness or right groin access site pain or active bleeding.   Allergies: Review of patient's allergies indicates no known allergies.  Medications: Prior to Admission medications   Medication Sig Start Date End Date Taking? Authorizing Provider  albuterol (PROVENTIL HFA;VENTOLIN HFA) 108 (90 BASE) MCG/ACT inhaler Inhale 2 puffs into the lungs every 4 (four) hours as needed for wheezing or shortness of breath.   Yes Historical Provider, MD  aspirin 325 MG tablet Take 325 mg by mouth daily.   Yes Historical Provider, MD  atorvastatin (LIPITOR) 40 MG tablet Take 40 mg by mouth at bedtime. 03/27/15  Yes Historical Provider, MD  clopidogrel (PLAVIX) 75 MG tablet Take 75 mg by mouth daily. 05/04/15  Yes Historical Provider, MD    Vital Signs: BP 86/35 mmHg  Pulse 47  Temp(Src) 98 F (36.7 C) (Oral)  Resp 9  Ht 5\' 9"  (1.753 m)  Wt 144 lb 10 oz (65.6 kg)  BMI 21.35 kg/m2  SpO2 100%  Physical Exam General: A&Ox3, NAD Abd: Soft, NT, ND Ext: RCFA access site dressing C/D/I, soft, NT, no signs of bleeding/hematoma Neuro: Equal strength upper and lower ext, EOMI, smile symmetrical, tongue midline, speech clear   Imaging: Ct Head Wo Contrast  05/23/2015   CLINICAL DATA:  Right-sided weakness and slurred speech  EXAM: CT HEAD WITHOUT CONTRAST  TECHNIQUE: Contiguous axial images were obtained from the base of the skull through the vertex without intravenous contrast.  COMPARISON:  MR brain 02/23/2015  FINDINGS: There is no evidence of mass effect, midline shift, or extra-axial fluid collections. There is no evidence of a space-occupying  lesion or intracranial hemorrhage. There is no evidence of a cortical-based area of acute infarction. There is an old left occipital lobe infarct. There is an old small left corona radiata infarct. There is generalized cerebral atrophy.  The ventricles and sulci are appropriate for the patient's age. The basal cisterns are patent.  Visualized portions of the orbits are unremarkable. The visualized portions of the paranasal sinuses and mastoid air cells are unremarkable. Cerebrovascular atherosclerotic calcifications are noted.  The osseous structures are unremarkable.  IMPRESSION: 1. No acute intracranial pathology. 2. Old left occipital lobe infarct. These results were called by telephone at the time of interpretation on 05/23/2015 at 7:52 pm to Dr. Dr. Janann Colonel, who verbally acknowledged these results.   Electronically Signed   By: Kathreen Devoid   On: 05/23/2015 19:57   Mr Brain Wo Contrast  05/23/2015   CLINICAL DATA:  Initial evaluation for recurrent transient episodes of speech deficits with right-sided weakness.  EXAM: MRI HEAD WITHOUT CONTRAST  TECHNIQUE: Multiplanar, multiecho pulse sequences of the brain and surrounding structures were obtained without intravenous contrast.  COMPARISON:  Previous MRI from 02/23/2015 as well as prior CT performed earlier on the same day.  FINDINGS: Abnormal restricted diffusion involves the cortical gray matter of the anterior left temporal lobe, primarily involving the left superior temporal gyrus (series 4, image 20). Associated signal loss seen on corresponding ADC map, consistent with acute ischemic infarct. No associated hemorrhage or significant mass effect. No other areas of acute infarction identified.  Continued interval evolution about previously identified subacute left occipital lobe infarct. There is associated  encephalomalacia of with laminar necrosis within this region. Additional probable subacute ischemic infarcts within the periventricular and deep white  matter of the left cerebral hemisphere. These findings were seen on prior MRI. More chronic appearing lacunar infarct within the left corona radiata. No acute or chronic intracranial hemorrhage. Diminished vascular flow void within the left internal carotid artery and left middle cerebral artery, similar to previous studies. Somewhat diminutive vertebrobasilar system.  No mass lesion, midline shift, or mass effect. Mild chronic microvascular ischemic changes present within the periventricular No hydrocephalus. No extra-axial fluid collection.  Craniocervical junction within normal limits. Postoperative changes again noted within the sella.  No acute abnormality about the orbits.  Scattered mucosal thickening within the ethmoidal air cells. Paranasal sinuses are otherwise clear. Small amount of opacity present within the posterior right mastoid air cells. Inner ear structures normal.  Bone marrow signal intensity within normal limits. No scalp soft tissue abnormality.  IMPRESSION: 1. Acute ischemic nonhemorrhagic cortical infarction involving the anterior left temporal lobe without significant mass effect. 2. Continued interval evolution of subacute to chronic left occipital, left periventricular, and left deep white matter infarcts in the left MCA territory. 3. Diminished flow void within the visualized distal left internal carotid artery as well as the left MCA, similar to previous study.   Electronically Signed   By: Jeannine Boga M.D.   On: 05/23/2015 23:55   Ir Angio Intra Extracran Sel Com Carotid Innominate Uni R Mod Sed  05/25/2015   CLINICAL DATA:  Intermittent expressive aphasia. History of severe extracranial atherosclerotic cerebrovascular disease.  EXAM: IR ANGIO INTRA EXTRACRAN SEL COM CAROTID INNOMINATE UNI RIGHT MOD SED RIGHT COMMON CAROTID ARTERY, RIGHT SUBCLAVIAN ARTERY AND RIGHT VERTEBRAL ARTERY ANGIOGRAMS.  ANESTHESIA/SEDATION: Conscious sedation.  MEDICATIONS: Versed 1/2 mg IV.   Fentanyl 25 mcg IV.  CONTRAST:  40 mL OMNIPAQUE IOHEXOL 300 MG/ML  SOLN  PROCEDURE: Following a full explanation of the procedure along with the potential associated complications, an informed witnessed consent was obtained.  The right groin was prepped and draped in the usual sterile fashion. Thereafter using modified Seldinger technique, transfemoral access into the right common femoral artery was obtained without difficulty. Over a 0.035 inch guidewire, a 5 French Pinnacle sheath was inserted. Through this, and also over a 0.035 inch guidewire, a 5 French JB1 catheter was advanced to the aortic arch region and selectively positioned in the right common carotid artery and the right subclavian artery. There were no acute complications. The patient tolerated the procedure well.  COMPLICATIONS: None immediate.  FINDINGS: The innominate artery injection demonstrates the origin of the right common carotid artery to be widely patent.  The origin of the right subclavian artery demonstrates a severe pre-occlusive stenosis of 90% plus. Also demonstrated is a tight stenosis of 70-75% of the right vertebral artery.  Antegrade flow is noted in the right vertebral artery to the cranial skull base were there is opacification of the right posterior-inferior cerebellar artery and the right vertebrobasilar junction.  Also noted are extensive collaterals that transverse the midline from the thyrocervical trunk and also the right external carotid artery opacifying the contralateral left vertebral artery, and also retrogradely the left common carotid artery. This vessel then demonstrates antegrade flow to the cranial skull base. Also demonstrated is antegrade flow from retrograde reconstitution of the left vertebral artery to the cranial skull base to the left vertebrobasilar junction.  Retrograde flow is also noted into the left occipital artery and then retrogradely to the left external  carotid artery and the left internal carotid  artery branches.  The right common carotid arteriogram demonstrates a severe pre-occlusive stenosis of the right external carotid artery origin. Abnormally enlarged right superior thyroidal artery is seen with multiple branches at the level of the thyroid gland retrogradely supplying the left external carotid artery and subsequently the left internal carotid artery.  The left internal carotid artery demonstrates wide patency at the bulb associated with calcified plaque. No acute ulcerations are seen.  The left internal carotid artery is, otherwise, seen to opacify to the cranial skull base.  The petrous, cavernous and the supraclinoid segments are widely patent.  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  Right internal carotid artery petrous cavernous and supraclinoid segments are widely patent  There is a right posterior communicating artery which has a 50% stenosis. The posterior communicating artery is seen to supply the posterior cerebral arteries.  The right middle and the right anterior cerebral arteries opacify into the capillary and venous phases.  There is prompt opacification via the anterior communicating artery of the hypoplastic left anterior cerebral A1 segment. Subsequent opacification is noted of the left MCA distribution.  The delayed arterial phase demonstrates retrograde opacification of the periinsular branches from the left anterior cerebral artery pericallosal and callosal marginal branches.  IMPRESSION: Interval worsening of the right subclavian artery stenoses, and of the right vertebral artery stenoses, with extensive collaterals reconstituting the left vertebral artery, and the left internal carotid artery as described above.  The above findings were discussed with the patient. Given the fact the patient is symptomatic with 2-3 spells of 15 minutes duration expressive aphasia with moderate right side  weakness with full recovery and the angiographic findings, augmentation of flow into the left cerebral hemisphere needs to be considered. The patient is agreeable to undergo this. This will be scheduled to be performed in the next few days.   Electronically Signed   By: Luanne Bras M.D.   On: 05/24/2015 14:34    Labs:  CBC:  Recent Labs  01/08/15 1152 05/23/15 1956 05/23/15 2004 05/23/15 2140 05/27/15 0113  WBC 10.1  --  8.1 9.7 7.9  HGB 13.3 13.3 13.3 12.1* 11.7*  HCT 41.1 39.0 40.5 37.1* 35.6*  PLT 326  --  246 232 206    COAGS:  Recent Labs  01/08/15 1152 01/08/15 1154 05/23/15 2004  INR 1.09  --  1.06  APTT  --  34 29    BMP:  Recent Labs  01/08/15 1152 02/23/15 1144 05/23/15 1956 05/23/15 2004 05/27/15 0113  NA 144  --  141 132* 139  K 3.7  --  4.1 3.8 3.5  CL 102  --  105 99* 108  CO2 26  --   --  24 25  GLUCOSE 94  --  100* 102* 103*  BUN <5*  --  11 9 6   CALCIUM 9.6  --   --  8.4* 8.4*  CREATININE 0.93 0.90 0.80 0.79 0.69  GFRNONAA >90  --   --  >60 >60  GFRAA >90  --   --  >60 >60    LIVER FUNCTION TESTS:  Recent Labs  05/23/15 2004  BILITOT 0.3  AST 19  ALT 15*  ALKPHOS 33  PROT 6.5  ALBUMIN 3.8    Assessment and Plan: Cerebrovascular disease with known high grade stenosis of the right subclavian and vertebral arteries. S/p successful RT subclavian  angioplastyprox toRT Vert artery origin with modestly improved caliber C/W prior to angioplasty On ASA 325 mg and Plavix 75 mg daily, right CFA access site stable Neurologically stable Will need F/U in 2 weeks with Dr. [name]    Signed: Tsosie Billing D 05/27/2015, 9:44 AM   I spent a total of 15 Minutes in face to face in clinical consultation/evaluation, greater than 50% of which was counseling/coordinating care for

## 2015-05-27 NOTE — Evaluation (Signed)
Occupational Therapy Re-Evaluation Patient Details Name: Raymond Carney MRN: [id number] DOB: 1961/10/19 Today's Date: 05/27/2015    History of Present Illness Raymond Carney is an 54 y.o. 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 Presents with stuttering symptoms of speech difficulty and right sided weakness. Speech deficits described as dysarthria and an apparent expressive aphasia.  Pt underwent successful Rt subclavian angioplasty 05/26/15 and PT/OT re-ordered post procedure.    Clinical Impression   Pt admitted with above.  He appears to be at baseline, and family present who confirms this.  Pt does report he has had blurry vision for ~ 1 year and has seen ophthalmologist.  He does have difficulty reading because of this, but all other visual testing appeared to be [facility].  Recommend he follow up with ophthalmologist at discharge.  He is mod I with ADLs.  No further OT needed, will sign off.     Follow Up Recommendations  No OT follow up;Supervision - Intermittent    Equipment Recommendations  None recommended by OT    Recommendations for Other Services       Precautions / Restrictions Precautions Precautions: None      Mobility Bed Mobility                  Transfers Overall transfer level: Modified independent                    Balance Overall balance assessment: Needs assistance Sitting-balance support: Feet supported Sitting balance-Leahy Scale: Normal     Standing balance support: During functional activity;No upper extremity supported Standing balance-Leahy Scale: Good                              ADL                                               Vision Vision Assessment?: Yes Eye Alignment: Within Functional Limits Ocular Range of Motion: Within Functional Limits Alignment/Gaze Preference: Within Defined  Limits Tracking/Visual Pursuits: Able to track stimulus in all quads without difficulty Saccades: Decreased speed of saccadic movement Convergence: Within functional limits Visual Fields: No apparent deficits Additional Comments: Pt with difficulty reading.  He reports he has seen ophthalmology who prescribed glasses, but acuity still decreased.  He was unable to reach medium sized print, but was able to read large, bold sized print    Perception Perception Perception Tested?: Yes   Praxis Praxis Praxis tested?: Within functional limits    Pertinent Vitals/Pain Pain Assessment: No/denies pain     Hand Dominance Right   Extremity/Trunk Assessment Upper Extremity Assessment Upper Extremity Assessment: Overall WFL for tasks assessed   Lower Extremity Assessment Lower Extremity Assessment: Defer to PT evaluation   Cervical / Trunk Assessment Cervical / Trunk Assessment: Normal   Communication Communication Communication: No difficulties   Cognition Arousal/Alertness: Awake/alert Behavior During Therapy: WFL for tasks assessed/performed Overall Cognitive Status: Within Functional Limits for tasks assessed (family present and confirms pt at baseline )                     General Comments       Exercises       Shoulder  Instructions      Home Living Family/patient expects to be discharged to:: Private residence Living Arrangements: Parent (with both parents) Available Help at Discharge: Family;Available 24 hours/day Type of Home: House Home Access: Stairs to enter [employer] of Steps: 3 Entrance Stairs-Rails: Right;Left Home Layout: One level     Bathroom Shower/Tub: Walk-in shower;Tub/shower unit   Bathroom Toilet: Handicapped height     Home Equipment: Environmental consultant - 2 wheels;Bedside commode;Shower seat;Wheelchair - power;Wheelchair - manual          Prior Functioning/Environment Level of Independence: Independent        Comments: Pt  reports he has had vision changes over the past year and is seeing opthalmology.  He has limited his driving since then     OT Diagnosis: Generalized weakness   OT Problem List:     OT Treatment/Interventions:      OT Goals(Current goals can be found in the care plan section) Acute Rehab OT Goals Patient Stated Goal: to go home  OT Goal Formulation: All assessment and education complete, DC therapy  OT Frequency:     Barriers to D/C:            Co-evaluation              End of Session Nurse Communication: Mobility status  Activity Tolerance: Patient tolerated treatment well Patient left: Other (comment) (with PT )   Time: 2263-3354 OT Time Calculation (min): 17 min Charges:  OT General Charges $OT Visit: 1 Procedure OT Evaluation $OT Re-eval: 1 Procedure G-Codes:    Dennise Raabe, Ellard Artis M 05/30/2015, 2:31 PM

## 2015-05-27 NOTE — Plan of Care (Signed)
Problem: Discharge/Transitional Outcomes Goal: Hemodynamically stable Outcome: Completed/Met Date Met:  05/27/15 Pt has had low BPs and MD has been aware. Pt remains Alert, oriented, asymptomatic.

## 2015-05-27 NOTE — Discharge Summary (Addendum)
Physician Discharge Summary  Patient ID: Raymond Carney MRN: [id number] DOB/AGE: 1961-03-23 54 y.o.  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 Presented on 05/23/15 with stuttering symptoms of speech difficulty and right sided weakness. Speech deficits described as dysarthria and an apparent expressive aphasia. Acute onset of symptoms around 1800, resolved around 1hr later and then returned around 1935, this time lasting around 15 minutes before mostly resolving again. (dysarthria remains). Reported taking ASA and Plavix at home. CT head imaging reviewed, showed no acute process. Patient is followed by Dr [name] who has discussed possibility of endovascular revascularization. Patient last had IR angio on 01/08/2015. 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 No acute intracranial pathology.  2. Old left occipital lobe infarct.  Mr Brain Wo Contrast 05/23/2015  1. Acute ischemic nonhemorrhagic cortical infarction involving the anterior left temporal lobe without significant mass effect.  2. Continued interval evolution of subacute to chronic left occipital, left periventricular, and left deep white matter infarcts in the left MCA territory.  3. Diminished flow void within the visualized distal left internal carotid artery as well as the left MCA, similar to previous study.  Cerebral catheter angio 05/24/15 : worsening RT subclavian artery stenosis90% ,and RT vert artery origin, 70 to 75% stenosis. Chronic left carotid and subclavian occlusion 05/26/2015 IR Procedure : RT subclavian angioplasty prox to RT Vert artery origin with modestly improved caliber C/W prior to angioplasty.  Treatments:   05/26/2015 :S/P RT subclavian angioplasty prox to RT Vert artery origin with modestly improved caliber C/W prior to angioplasty Discharge Exam: Blood pressure 91/48, pulse 50, temperature 98.1 F (36.7 C), temperature source Oral, resp. rate 12, height 5\' 9"  (1.753 m), weight 144 lb 10 oz (65.6 kg), SpO2 100 %.  Physical Exam :  Afebrile. Head is nontraumatic. Neck is supple without bruit.    Cardiac exam no murmur or gallop. Lungs are clear to auscultation. Distal pulses are well felt. Neurological Exam :  Awake alert oriented 3. No aphasia and dysarthria can be easily understood. Follows commands well. Extraocular moments are full range without nystagmus. Fundi were not visualized. Vision acuity seems adequate. Mild right lower facial asymmetry. Tongue midline. Motor system exam no upper or lower extremity drift. Diminished fine finger movements on the right. Orbits left or right upper extremity. Minimum  weakness of right grip and hip flexors only. No  lower extremity drift. Sensation appears intact bilaterally. Coordination is slow but accurate. Gait was not tested. Disposition: Final discharge disposition not confirmed  Discharge Instructions    Diet - low sodium heart healthy    Complete by:  As directed      Discharge instructions    Complete by:  As directed   follow up in stroke clinic in 2 months     Increase activity slowly    Complete by:  As directed      Smoking cessation counselling       Medication List    TAKE these medications        albuterol 108 (90 BASE) MCG/ACT inhaler  Commonly known as:  PROVENTIL HFA;VENTOLIN HFA  Inhale 2 puffs into the lungs every 4 (four) hours as needed for wheezing or shortness of breath.     aspirin 325 MG tablet  Take 325 mg by mouth daily.     atorvastatin 40 MG tablet  Commonly known as:  LIPITOR  Take 40 mg by mouth at bedtime.     clopidogrel 75 MG tablet  Commonly known as:  PLAVIX  Take 75 mg by mouth daily.           Follow-up Information    Follow up with DEVESHWAR, Fritz Pickerel, MD In 2 weeks.   Specialty:  Interventional Radiology   Why:  Our office will call with appointment [phone]   Contact information:   [address] [place] [address] [phone]       Follow up with Romulus Hanrahan, MD In 2 months.   Specialties:  Neurology, Radiology   Contact information:   [place] [address] [phone]       Follow up with DEVESHWAR, Fritz Pickerel, MD In 2 weeks.   Specialty:  Interventional Radiology   Why:  call earlier if symptoms worsen or new neurologica   Contact information:   [address] [place] [address] [phone]     35 minutes spent on discharge summary  Signed: Davontae Prusinski 05/27/2015, 1:44 PM

## 2015-05-27 NOTE — Progress Notes (Addendum)
Pt given d/c information sheet and reviewed with pt. Questions answered. Pt's sister and father present during d/c teaching. All aware of when to f/u with both Dr. Leonie Man & Dr. [name]. Pt denies and issues at this time. Neuro same. VSS. BP still low, 78/60 in left leg, but pt remains asymptomatic. Pt dressed, groin site level 0, distal pulses still able to be dopplered in all 4 extremities. Pt d/c home with family via NT & wheelchair escort.

## 2015-06-14 ENCOUNTER — Ambulatory Visit (HOSPITAL_COMMUNITY)
Admission: RE | Admit: 2015-06-14 | Discharge: 2015-06-14 | Disposition: A | Discharge status: Home / Self Care | Payer: Medicare Other | Source: Ambulatory Visit | Attending: Radiology | Admitting: Radiology

## 2015-06-14 DIAGNOSIS — I771 Stricture of artery: Secondary | ICD-10-CM

## 2015-06-14 DIAGNOSIS — I742 Embolism and thrombosis of arteries of the upper extremities: Secondary | ICD-10-CM | POA: Diagnosis not present

## 2015-07-06 DIAGNOSIS — Z8673 Personal history of transient ischemic attack (TIA), and cerebral infarction without residual deficits: Secondary | ICD-10-CM | POA: Insufficient documentation

## 2015-07-06 DIAGNOSIS — Z01810 Encounter for preprocedural cardiovascular examination: Secondary | ICD-10-CM | POA: Insufficient documentation

## 2015-07-14 DIAGNOSIS — J449 Chronic obstructive pulmonary disease, unspecified: Secondary | ICD-10-CM | POA: Diagnosis not present

## 2015-07-14 DIAGNOSIS — R5383 Other fatigue: Secondary | ICD-10-CM | POA: Diagnosis not present

## 2015-07-20 DIAGNOSIS — Z01810 Encounter for preprocedural cardiovascular examination: Secondary | ICD-10-CM | POA: Diagnosis not present

## 2015-07-27 ENCOUNTER — Ambulatory Visit (INDEPENDENT_AMBULATORY_CARE_PROVIDER_SITE_OTHER): Payer: Medicare Other | Admitting: Neurology

## 2015-07-27 ENCOUNTER — Encounter: Payer: Self-pay | Admitting: Neurology

## 2015-07-27 VITALS — BP 100/70 | HR 72 | Ht 68.0 in | Wt 138.6 lb

## 2015-07-27 DIAGNOSIS — I708 Atherosclerosis of other arteries: Secondary | ICD-10-CM

## 2015-07-27 DIAGNOSIS — I639 Cerebral infarction, unspecified: Secondary | ICD-10-CM

## 2015-07-27 DIAGNOSIS — I771 Stricture of artery: Secondary | ICD-10-CM

## 2015-07-27 NOTE — Patient Instructions (Signed)
I had a long d/w patient and his father about his recent stroke, risk for recurrent stroke/TIAs, personally independently reviewed imaging studies and stroke evaluation results and answered questions.Continue aspirin 325 mg orally every day and clopidogrel 75 mg orally every day  for secondary stroke prevention given recent subclavian angioplasty and maintain strict control of hypertension with blood pressure goal below 130/90, diabetes with hemoglobin A1c goal below 6.5% and lipids with LDL cholesterol goal below 100 mg/dL. I also advised the patient to eat a healthy diet with plenty of whole grains, cereals, fruits and vegetables, exercise regularly and maintain ideal body weight .the patient wants to do a colonoscopy for rectal bleeding prior to his stroke. I had a long discussion with him and his dad regarding risk-benefit of stopping aspirin and Plavix for 5 days prior to colonoscopy with increased risk for stroke and stent occlusion. I recommend he discuss this with Dr. [name] prior to making a decision. Followup in the future with me in 6 months or call earlier if necessary  Stroke Prevention Some medical conditions and behaviors are associated with an increased chance of having a stroke. You may prevent a stroke by making healthy choices and managing medical conditions. HOW CAN I REDUCE MY RISK OF HAVING A STROKE?   Stay physically active. Get at least 30 minutes of activity on most or all days.  Do not smoke. It may also be helpful to avoid exposure to secondhand smoke.  Limit alcohol use. Moderate alcohol use is considered to be:  No more than 2 drinks per day for men.  No more than 1 drink per day for nonpregnant women.  Eat healthy foods. This involves:  Eating 5 or more servings of fruits and vegetables a day.  Making dietary changes that address high blood pressure (hypertension), high cholesterol, diabetes, or obesity.  Manage your cholesterol levels.  Making food choices  that are high in fiber and low in saturated fat, trans fat, and cholesterol may control cholesterol levels.  Take any prescribed medicines to control cholesterol as directed by your health care provider.  Manage your diabetes.  Controlling your carbohydrate and sugar intake is recommended to manage diabetes.  Take any prescribed medicines to control diabetes as directed by your health care provider.  Control your hypertension.  Making food choices that are low in salt (sodium), saturated fat, trans fat, and cholesterol is recommended to manage hypertension.  Take any prescribed medicines to control hypertension as directed by your health care provider.  Maintain a healthy weight.  Reducing calorie intake and making food choices that are low in sodium, saturated fat, trans fat, and cholesterol are recommended to manage weight.  Stop drug abuse.  Avoid taking birth control pills.  Talk to your health care provider about the risks of taking birth control pills if you are over 43 years old, smoke, get migraines, or have ever had a blood clot.  Get evaluated for sleep disorders (sleep apnea).  Talk to your health care provider about getting a sleep evaluation if you snore a lot or have excessive sleepiness.  Take medicines only as directed by your health care provider.  For some people, aspirin or blood thinners (anticoagulants) are helpful in reducing the risk of forming abnormal blood clots that can lead to stroke. If you have the irregular heart rhythm of atrial fibrillation, you should be on a blood thinner unless there is a good reason you cannot take them.  Understand all your medicine instructions.  Make  sure that other conditions (such as anemia or atherosclerosis) are addressed. SEEK IMMEDIATE MEDICAL CARE IF:   You have sudden weakness or numbness of the face, arm, or leg, especially on one side of the body.  Your face or eyelid droops to one side.  You have sudden  confusion.  You have trouble speaking (aphasia) or understanding.  You have sudden trouble seeing in one or both eyes.  You have sudden trouble walking.  You have dizziness.  You have a loss of balance or coordination.  You have a sudden, severe headache with no known cause.  You have new chest pain or an irregular heartbeat. Any of these symptoms may represent a serious problem that is an emergency. Do not wait to see if the symptoms will go away. Get medical help at once. Call your local emergency services (911 in [place] Do not drive yourself to the hospital. Document Released: 11/23/2004 Document Revised: 03/02/2014 Document Reviewed: 04/18/2013 [facility] Patient Information 2015 [place], [place]. This information is not intended to replace advice given to you by your health care provider. Make sure you discuss any questions you have with your health care provider.

## 2015-07-27 NOTE — Progress Notes (Signed)
[facility] [address] [place]. [place] [address] [phone]       OFFICE FOLLOW-UP NOTE  Raymond. Raymond Carney Date of Birth:  02-07-1961 Medical Record Number:  [id number]   HPI: Raymond Carney is seen for first office f/u visit after [facility] admission for stroke in July 2016. Raymond Carney is an 54 y.o. 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 Presented on 05/23/15 with stuttering symptoms of speech difficulty and right sided weakness. Speech deficits described as dysarthria and an apparent expressive aphasia. Acute onset of symptoms around 1800, resolved around 1hr later and then returned around 1935, this time lasting around 15 minutes before mostly resolving again. (dysarthria remains). Reported taking ASA and Plavix at home. CT scan of the head on admission showed no acute abnormality but an old left occipital lobe infarct. Patient previously had had catheter range exam performed on 01/08/15 by Dr. [name] which has shown occluded left common carotid, left subclavian artery with high-grade 95% stenosis of the right subclavian at its origin and 95% stenosis of the right vertebral at its origin with retrograde and antegrade reconstitution of the left MCA via anterior communicating artery collaterals. TPA was considered but not given because patient was considered too mild to treat with NIH of only 1 for dysarthria. MRI scan of the brain showed acute ischemic nonhemorrhagic left anterior temporal cortical infarct. Cerebral catheter angiogram 05/24/15 by Dr. [name] showed worsening of the right subclavian artery stenosis and right vertebral ostial stenosis with chronic left carotid and subclavian occlusion. Patient underwent elective right subclavian angioplasty proximal to the right vertebral artery origin which less to modest improvement in the caliber of the right vertebral  artery which did not need additional angioplasty. Patient was started on aspirin and Plavix and his speech improved. His minimum weakness in the right grip and hip flexors which improved as well. He states his done well since discharge. He is tolerating aspirin and Plavix without bleeding or bruising. He is also on Lipitor and tolerating it without myalgias or arthralgias. He states his blood pressure is good. Today it is 100/70. He quit smoking in spring of 2016. He has not yet seen Dr. the mesh were for follow-up. Patient wants to have a colonoscopy done because he was having some blood in his stools even prior to his stroke.  ROS:   14 system review of systems is positive for rectal bleeding and no other complaints  PMH:  Past Medical History  Diagnosis Date  . COPD (chronic obstructive pulmonary disease)   . CVD (cerebrovascular disease)   . Subclavian artery stenosis, right   . Stenosis of right vertebral artery   . Stroke     Social History:  Social History   Social History  . Marital Status: Single    Spouse Name: N/A  . Number of Children: N/A  . Years of Education: N/A   Occupational History  . Not on file.   Social History Main Topics  . Smoking status: Former Smoker -- 0.50 packs/day    Quit date: 01/22/2014  . Smokeless tobacco: Never Used  . Alcohol Use: No  . Drug Use: Not on file  . Sexual Activity: Not on file   Other Topics Concern  . Not on file   Social History Narrative    Medications:   Current Outpatient Prescriptions on File Prior to Visit  Medication Sig  Dispense Refill  . albuterol (PROVENTIL HFA;VENTOLIN HFA) 108 (90 BASE) MCG/ACT inhaler Inhale 2 puffs into the lungs every 4 (four) hours as needed for wheezing or shortness of breath.    [name] aspirin 325 MG tablet Take 325 mg by mouth daily.    [name] atorvastatin (LIPITOR) 40 MG tablet Take 40 mg by mouth at bedtime.  2  . clopidogrel (PLAVIX) 75 MG tablet Take 75 mg by mouth daily.  2   No current  facility-administered medications on file prior to visit.    Allergies:  No Known Allergies  Physical Exam General: Frail middle-aged Caucasian male seated, in no evident distress Head: head normocephalic and atraumatic.  Neck: supple with soft right carotid   bruit Cardiovascular: regular rate and rhythm, no murmurs Musculoskeletal: no deformity Skin:  no rash/petichiae Vascular:  Normal pulses all extremities. Right ocular and carotid bruit heard. No pulses felt in left upper extremity. Filed Vitals:   07/27/15 1255  BP: 100/70  Pulse: 72   Neurologic Exam Mental Status: Awake and fully alert. Oriented to place and time. Recent and remote memory intact. Attention span, concentration and fund of knowledge appropriate. Mood and affect appropriate.  Cranial Nerves: Fundoscopic exam reveals sharp disc margins. Pupils equal, briskly reactive to light. Extraocular movements full without nystagmus. Visual fields full to confrontation. Hearing intact. Facial sensation intact. Face, tongue, palate moves normally and symmetrically.  Motor: Normal bulk and tone. Normal strength in all tested extremity muscles. Sensory.: intact to touch ,pinprick .position and vibratory sensation.  Coordination: Rapid alternating movements normal in all extremities. Finger-to-nose and heel-to-shin performed accurately bilaterally. Gait and Station: Arises from chair without difficulty. Stance is normal. Gait demonstrates normal stride length and balance . Unable to heel, toe and tandem walk without difficulty.  Reflexes: 1+ and symmetric. Toes downgoing.   NIHSS 0 Modified Rankin 1  ASSESSMENT: 62 year patient with Left frontal MCA branch infarct secondary to large vessel and Extracranial Atherosclerosis but Likely Failure of Collaterals Circulation in a Patient with Known Chronic Left Common Carotid and Subclavian Occlusion. Patient underwent successful right subclavian angioplasty on 05/26/2015    PLAN: I  had a long d/w patient and his father about his recent stroke, risk for recurrent stroke/TIAs, personally independently reviewed imaging studies and stroke evaluation results and answered questions.Continue aspirin 325 mg orally every day and clopidogrel 75 mg orally every day  for secondary stroke prevention given recent subclavian angioplasty and maintain strict control of hypertension with blood pressure goal below 130/90, diabetes with hemoglobin A1c goal below 6.5% and lipids with LDL cholesterol goal below 100 mg/dL. I also advised the patient to eat a healthy diet with plenty of whole grains, cereals, fruits and vegetables, exercise regularly and maintain ideal body weight .the patient wants to do a colonoscopy for rectal bleeding prior to his stroke. I had a long discussion with him and his dad regarding risk-benefit of stopping aspirin and Plavix for 5 days prior to colonoscopy with increased risk for stroke and stent occlusion. I recommend he discuss this with Dr. [name] prior to making a decision. Greater than 50% of time during this 25 minute visit was spent on counseling, discussion with patient and family and coordination of careFollowup in the future with me in 6 months or call earlier if necessary  Antony Contras, MD  Note: This document was prepared with digital dictation and possible smart phrase technology. Any transcriptional errors that result from this process are unintentional

## 2015-08-16 DIAGNOSIS — I771 Stricture of artery: Secondary | ICD-10-CM | POA: Diagnosis not present

## 2015-08-16 DIAGNOSIS — Z8673 Personal history of transient ischemic attack (TIA), and cerebral infarction without residual deficits: Secondary | ICD-10-CM | POA: Diagnosis not present

## 2015-08-16 DIAGNOSIS — Z96643 Presence of artificial hip joint, bilateral: Secondary | ICD-10-CM | POA: Diagnosis not present

## 2015-08-16 DIAGNOSIS — E785 Hyperlipidemia, unspecified: Secondary | ICD-10-CM | POA: Diagnosis not present

## 2015-08-16 DIAGNOSIS — I6529 Occlusion and stenosis of unspecified carotid artery: Secondary | ICD-10-CM | POA: Diagnosis not present

## 2015-08-16 DIAGNOSIS — K029 Dental caries, unspecified: Secondary | ICD-10-CM | POA: Diagnosis not present

## 2015-08-16 DIAGNOSIS — J449 Chronic obstructive pulmonary disease, unspecified: Secondary | ICD-10-CM | POA: Diagnosis not present

## 2015-08-16 DIAGNOSIS — G459 Transient cerebral ischemic attack, unspecified: Secondary | ICD-10-CM | POA: Diagnosis not present

## 2015-08-16 DIAGNOSIS — Z23 Encounter for immunization: Secondary | ICD-10-CM | POA: Diagnosis not present

## 2015-08-16 DIAGNOSIS — Z87898 Personal history of other specified conditions: Secondary | ICD-10-CM | POA: Diagnosis not present

## 2015-09-20 DIAGNOSIS — E785 Hyperlipidemia, unspecified: Secondary | ICD-10-CM | POA: Diagnosis not present

## 2015-11-04 DIAGNOSIS — E785 Hyperlipidemia, unspecified: Secondary | ICD-10-CM | POA: Diagnosis not present

## 2015-11-18 ENCOUNTER — Other Ambulatory Visit (HOSPITAL_COMMUNITY): Payer: Self-pay | Admitting: Interventional Radiology

## 2015-11-18 DIAGNOSIS — I771 Stricture of artery: Secondary | ICD-10-CM

## 2015-11-22 ENCOUNTER — Ambulatory Visit (HOSPITAL_COMMUNITY)
Admission: RE | Admit: 2015-11-22 | Discharge: 2015-11-22 | Disposition: A | Discharge status: Home / Self Care | Payer: Medicare Other | Source: Ambulatory Visit | Attending: Interventional Radiology | Admitting: Interventional Radiology

## 2015-11-22 DIAGNOSIS — I6523 Occlusion and stenosis of bilateral carotid arteries: Secondary | ICD-10-CM | POA: Insufficient documentation

## 2015-11-22 DIAGNOSIS — I771 Stricture of artery: Secondary | ICD-10-CM

## 2015-11-22 NOTE — Progress Notes (Signed)
*  PRELIMINARY RESULTS* Vascular Ultrasound Carotid Duplex (Doppler) has been completed.  Preliminary findings:   Right ICA 60-79% stenosis. Right ECA >50% stenosis. Right subclavian >50% stenosis. Left subclavian severely dampened suggesting a proximal obstruction. Left vertebral occluded. Left CCA occluded.   Landry Mellow, RDMS, RVT  11/22/2015, 3:22 PM

## 2015-12-21 ENCOUNTER — Telehealth (HOSPITAL_COMMUNITY): Payer: Self-pay

## 2015-12-21 NOTE — Telephone Encounter (Signed)
Spoke with Raymond Carney and his sister Thayer Headings and informed him that per Dr. [name] it was ok to stop plavix 5 days prior to colonoscopy. He has to stay on aspirin 325mg . If he had any questions about being able to drive again, then they would have to come back in for a consult to discuss with Dr. London Sheer he has not been seen since August. They agreed with this plan. AW

## 2016-01-25 ENCOUNTER — Ambulatory Visit (INDEPENDENT_AMBULATORY_CARE_PROVIDER_SITE_OTHER): Payer: Medicare Other | Admitting: Neurology

## 2016-01-25 ENCOUNTER — Encounter: Payer: Self-pay | Admitting: Neurology

## 2016-01-25 VITALS — BP 140/68 | HR 70 | Ht 68.0 in | Wt 141.2 lb

## 2016-01-25 DIAGNOSIS — I6522 Occlusion and stenosis of left carotid artery: Secondary | ICD-10-CM

## 2016-01-25 NOTE — Progress Notes (Signed)
[facility] [address] [place]. [place] [address] [phone]       OFFICE FOLLOW-UP NOTE  Raymond. Raymond Carney Date of Birth:  16-Jun-1961 Medical Record Number:  [id number]   HPI: Raymond Carney is seen for first office f/u visit after [facility] admission for stroke in July 2016. Raymond Carney is an 55 y.o. 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 Presented on 05/23/15 with stuttering symptoms of speech difficulty and right sided weakness. Speech deficits described as dysarthria and an apparent expressive aphasia. Acute onset of symptoms around 1800, resolved around 1hr later and then returned around 1935, this time lasting around 15 minutes before mostly resolving again. (dysarthria remains). Reported taking ASA and Plavix at home. CT scan of the head on admission showed no acute abnormality but an old left occipital lobe infarct. Patient previously had had catheter range exam performed on 01/08/15 by Dr. [name] which has shown occluded left common carotid, left subclavian artery with high-grade 95% stenosis of the right subclavian at its origin and 95% stenosis of the right vertebral at its origin with retrograde and antegrade reconstitution of the left MCA via anterior communicating artery collaterals. TPA was considered but not given because patient was considered too mild to treat with NIH of only 1 for dysarthria. MRI scan of the brain showed acute ischemic nonhemorrhagic left anterior temporal cortical infarct. Cerebral catheter angiogram 05/24/15 by Dr. [name] showed worsening of the right subclavian artery stenosis and right vertebral ostial stenosis with chronic left carotid and subclavian occlusion. Patient underwent elective right subclavian angioplasty proximal to the right vertebral artery origin which less to modest improvement in the caliber of the right vertebral  artery which did not need additional angioplasty. Patient was started on aspirin and Plavix and his speech improved. His minimum weakness in the right grip and hip flexors which improved as well. He states his done well since discharge. He is tolerating aspirin and Plavix without bleeding or bruising. He is also on Lipitor and tolerating it without myalgias or arthralgias. He states his blood pressure is good. Today it is 100/70. He quit smoking in spring of 2016. He has not yet seen Dr. the mesh were for follow-up. Patient wants to have a colonoscopy done because he was having some blood in his stools even prior to his stroke. Update 01/25/16 : He returns for follow-up after last visit 6 months ago. Continue to do well without recurrent stroke or TIA symptoms. He is still living with his dad but his sister is accompanying him today. Patient has not had done colonoscopy as he wanted to wait due to stroke risk. He had follow-up carotid ultrasound done on 11/22/15 which I have reviewed shows chronically occluded left common carotid, left vertebral and subclavian arteries. Right internal carotid artery shows elevated velocities in the 60-79% range and right subclavian greater than 50% stenosis. Patient remains on aspirin and Plavix tolerating it well with only minor bruising and no bleeding episodes. Patient does have a family history of colon cancer and his colonoscopy is overdue and he has questions about stroke risk while holding antiplatelet therapy for the procedure. . ROS:   14 system review of systems is positive for     No  complaints today   PMH:  Past Medical History  Diagnosis Date  . COPD (chronic obstructive pulmonary disease) ([place])   . CVD (cerebrovascular disease)   .  Subclavian artery stenosis, right   . Stenosis of right vertebral artery   . Stroke [facility])     Social History:  Social History   Social History  . Marital Status: Married    Spouse Name: N/A  . Number of Children: N/A  .  Years of Education: N/A   Occupational History  . Not on file.   Social History Main Topics  . Smoking status: Former Smoker -- 0.50 packs/day    Quit date: 01/22/2014  . Smokeless tobacco: Never Used  . Alcohol Use: No  . Drug Use: Not on file  . Sexual Activity: Not on file   Other Topics Concern  . Not on file   Social History Narrative    Medications:   Current Outpatient Prescriptions on File Prior to Visit  Medication Sig Dispense Refill  . albuterol (PROVENTIL HFA;VENTOLIN HFA) 108 (90 BASE) MCG/ACT inhaler Inhale 2 puffs into the lungs every 4 (four) hours as needed for wheezing or shortness of breath.    [name] aspirin 325 MG tablet Take 325 mg by mouth daily.    [name] atorvastatin (LIPITOR) 40 MG tablet Take 40 mg by mouth at bedtime.  2  . clopidogrel (PLAVIX) 75 MG tablet Take 75 mg by mouth daily.  2   No current facility-administered medications on file prior to visit.    Allergies:  No Known Allergies  Physical Exam General: Frail middle-aged Caucasian male seated, in no evident distress Head: head normocephalic and atraumatic.  Neck: supple with soft right carotid   bruit and right vertebral and basilar bruits. Cardiovascular: regular rate and rhythm, no murmurs Musculoskeletal: no deformity Skin:  no rash/petichiae Vascular:  Normal pulses all extremities. Right ocular and carotid bruit heard. No pulses felt in left upper extremity. And febrile in right upper extremity. Filed Vitals:   01/25/16 1535  BP: 140/68  Pulse: 70   Neurologic Exam Mental Status: Awake and fully alert. Oriented to place and time. Recent and remote memory intact. Attention span, concentration and fund of knowledge appropriate. Mood and affect appropriate.  Cranial Nerves: Fundoscopic exam not done  . Pupils equal, briskly reactive to light. Extraocular movements full without nystagmus. Visual fields full to confrontation. Hearing intact. Facial sensation intact. Face, tongue, palate moves  normally and symmetrically.  Motor: Normal bulk and tone. Normal strength in all tested extremity muscles. Sensory.: intact to touch ,pinprick .position and vibratory sensation.  Coordination: Rapid alternating movements normal in all extremities. Finger-to-nose and heel-to-shin performed accurately bilaterally. Gait and Station: Arises from chair without difficulty. Stance is normal. Gait demonstrates normal stride length and balance . Unable to heel, toe and tandem walk without difficulty.  Reflexes: 1+ and symmetric. Toes downgoing.   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 Patient underwent successful right subclavian angioplasty on 05/26/2015    PLAN: I had a long d/w patient and his sister about his remote stroke, risk for recurrent stroke/TIAs, personally independently reviewed imaging studies and stroke evaluation results and answered questions.Continue aspirin 325 mg daily and clopidogrel 75 mg daily  for secondary stroke prevention and maintain strict control of hypertension with blood pressure goal below 130/90, diabetes with hemoglobin A1c goal below 6.5% and lipids with LDL cholesterol goal below 70 mg/dL. I also advised the patient to eat a healthy  diet with plenty of whole grains, cereals, fruits and vegetables, exercise regularly and maintain ideal body weight..The patient is scheduled to undergo colonoscopy. He may hold aspirin and Plavix for 3-5 days prior to the procedure with a small but acceptable preprocedure risk of TIA/stroke and resume it after the procedure when safe. The patient understands and is willing to do so. He may also drive without restrictions. Followup in the future with me in 6 months or call earlier if necessary greater than 50%  time during this 30 minute visit was spent on counseling and coordination of care about his remote stroke as well as periprocedural stroke risk of colonoscopy on stopping antiplatelet agents.   Antony Contras, MD  Note: This document was prepared with digital dictation and possible smart phrase technology. Any transcriptional errors that result from this process are unintentional

## 2016-01-25 NOTE — Patient Instructions (Signed)
I had a long d/w patient and his sister about his remote stroke, risk for recurrent stroke/TIAs, personally independently reviewed imaging studies and stroke evaluation results and answered questions.Continue aspirin 325 mg daily and clopidogrel 75 mg daily  for secondary stroke prevention and maintain strict control of hypertension with blood pressure goal below 130/90, diabetes with hemoglobin A1c goal below 6.5% and lipids with LDL cholesterol goal below 70 mg/dL. I also advised the patient to eat a healthy diet with plenty of whole grains, cereals, fruits and vegetables, exercise regularly and maintain ideal body weight.patient is scheduled to undergo colonoscopy. He may hold aspirin and Plavix for 3-5 days prior to the procedure with a small but acceptable preprocedure risk of TIA/stroke and resume it after the procedure when safe. The patient understands and is willing to do so. He may also drive without restrictions. Followup in the future with me in 6 months or call earlier if necessary

## 2016-02-29 DIAGNOSIS — Z87898 Personal history of other specified conditions: Secondary | ICD-10-CM | POA: Diagnosis not present

## 2016-02-29 DIAGNOSIS — Z1389 Encounter for screening for other disorder: Secondary | ICD-10-CM | POA: Diagnosis not present

## 2016-02-29 DIAGNOSIS — Z125 Encounter for screening for malignant neoplasm of prostate: Secondary | ICD-10-CM | POA: Diagnosis not present

## 2016-02-29 DIAGNOSIS — M25552 Pain in left hip: Secondary | ICD-10-CM | POA: Diagnosis not present

## 2016-02-29 DIAGNOSIS — Z0000 Encounter for general adult medical examination without abnormal findings: Secondary | ICD-10-CM | POA: Diagnosis not present

## 2016-02-29 DIAGNOSIS — Z96643 Presence of artificial hip joint, bilateral: Secondary | ICD-10-CM | POA: Diagnosis not present

## 2016-02-29 DIAGNOSIS — J449 Chronic obstructive pulmonary disease, unspecified: Secondary | ICD-10-CM | POA: Diagnosis not present

## 2016-02-29 DIAGNOSIS — M25551 Pain in right hip: Secondary | ICD-10-CM | POA: Diagnosis not present

## 2016-02-29 DIAGNOSIS — Z8673 Personal history of transient ischemic attack (TIA), and cerebral infarction without residual deficits: Secondary | ICD-10-CM | POA: Diagnosis not present

## 2016-02-29 DIAGNOSIS — E785 Hyperlipidemia, unspecified: Secondary | ICD-10-CM | POA: Diagnosis not present

## 2016-02-29 DIAGNOSIS — K029 Dental caries, unspecified: Secondary | ICD-10-CM | POA: Diagnosis not present

## 2016-02-29 DIAGNOSIS — I771 Stricture of artery: Secondary | ICD-10-CM | POA: Diagnosis not present

## 2016-03-01 DIAGNOSIS — Z800 Family history of malignant neoplasm of digestive organs: Secondary | ICD-10-CM | POA: Diagnosis not present

## 2016-03-01 DIAGNOSIS — Z86010 Personal history of colonic polyps: Secondary | ICD-10-CM | POA: Diagnosis not present

## 2016-03-01 DIAGNOSIS — Z1211 Encounter for screening for malignant neoplasm of colon: Secondary | ICD-10-CM | POA: Diagnosis not present

## 2016-04-05 DIAGNOSIS — Z86010 Personal history of colonic polyps: Secondary | ICD-10-CM | POA: Diagnosis not present

## 2016-05-16 ENCOUNTER — Other Ambulatory Visit (HOSPITAL_COMMUNITY): Payer: Self-pay | Admitting: Interventional Radiology

## 2016-05-16 DIAGNOSIS — I771 Stricture of artery: Secondary | ICD-10-CM

## 2016-05-22 ENCOUNTER — Ambulatory Visit (HOSPITAL_COMMUNITY)
Admission: RE | Admit: 2016-05-22 | Discharge: 2016-05-22 | Disposition: A | Discharge status: Home / Self Care | Payer: Medicare Other | Source: Ambulatory Visit | Attending: Interventional Radiology | Admitting: Interventional Radiology

## 2016-05-22 DIAGNOSIS — I708 Atherosclerosis of other arteries: Secondary | ICD-10-CM | POA: Insufficient documentation

## 2016-05-22 DIAGNOSIS — I6502 Occlusion and stenosis of left vertebral artery: Secondary | ICD-10-CM | POA: Insufficient documentation

## 2016-05-22 DIAGNOSIS — I771 Stricture of artery: Secondary | ICD-10-CM | POA: Insufficient documentation

## 2016-05-22 DIAGNOSIS — I6523 Occlusion and stenosis of bilateral carotid arteries: Secondary | ICD-10-CM | POA: Diagnosis not present

## 2016-05-22 LAB — VAS US CAROTID
LICAPSYS: 85 cm/s
RCCADSYS: -127 cm/s
RCCAPDIAS: 28 cm/s
RCCAPSYS: 102 cm/s
RIGHT ECA DIAS: -91 cm/s
RIGHT VERTEBRAL DIAS: 17 cm/s

## 2016-05-22 NOTE — Progress Notes (Signed)
*  PRELIMINARY RESULTS* Vascular Ultrasound Carotid Duplex (Doppler) has been completed.  Preliminary findings: Right internal carotid artery 60-79% stenosis. Right vertebral dampened, suggestive of a more proximal obstruction. Right external carotid artery >50% stenosis. Right subclavian artery >50% stenosis. Left common carotid artery occlusion. Left internal carotid artery occlusion. Left vertebral artery occlusion. Retrograde flow in left external carotid artery. Dampened flow in left subclavian artery, suggestive of a more proximal obstruction.   Landry Mellow, RDMS, RVT  05/22/2016, 1:24 PM

## 2016-06-08 ENCOUNTER — Telehealth (HOSPITAL_COMMUNITY): Payer: Self-pay

## 2016-06-08 NOTE — Telephone Encounter (Signed)
Pt agreed to f/u in 6 months with an ultrasound. AW

## 2016-08-09 ENCOUNTER — Ambulatory Visit (INDEPENDENT_AMBULATORY_CARE_PROVIDER_SITE_OTHER): Payer: Medicare Other | Admitting: Neurology

## 2016-08-09 ENCOUNTER — Encounter: Payer: Self-pay | Admitting: Neurology

## 2016-08-09 VITALS — BP 100/70 | HR 65 | Ht 68.0 in | Wt 135.0 lb

## 2016-08-09 DIAGNOSIS — I6521 Occlusion and stenosis of right carotid artery: Secondary | ICD-10-CM

## 2016-08-09 NOTE — Progress Notes (Signed)
[facility] [address] [place]. [place] [address] [phone]       OFFICE FOLLOW-UP NOTE  [name] Date of Birth:  13-Jun-1961 Medical Record Number:  [id number]   HPI: Mr Raymond Carney is seen for first office f/u visit after [name] admission for stroke in July 2016. [name] is an 55 y.o. 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 Presented on 05/23/15 with stuttering symptoms of speech difficulty and right sided weakness. Speech deficits described as dysarthria and an apparent expressive aphasia. Acute onset of symptoms around 1800, resolved around 1hr later and then returned around 1935, this time lasting around 15 minutes before mostly resolving again. (dysarthria remains). Reported taking ASA and Plavix at home. CT scan of the head on admission showed no acute abnormality but an old left occipital lobe infarct. Patient previously had had catheter range exam performed on 01/08/15 by Dr. Laurence Carney which has shown occluded left common carotid, left subclavian artery with high-grade 95% stenosis of the right subclavian at its origin and 95% stenosis of the right vertebral at its origin with retrograde and antegrade reconstitution of the left MCA via anterior communicating artery collaterals. TPA was considered but not given because patient was considered too mild to treat with NIH of only 1 for dysarthria. MRI scan of the brain showed acute ischemic nonhemorrhagic left anterior temporal cortical infarct. Cerebral catheter angiogram 05/24/15 by Dr. Estanislado Carney showed worsening of the right subclavian artery stenosis and right vertebral ostial stenosis with chronic left carotid and subclavian occlusion. Patient underwent elective right subclavian angioplasty proximal to the right vertebral artery origin which less to modest improvement in the caliber of the right vertebral  artery which did not need additional angioplasty. Patient was started on aspirin and Plavix and his speech improved. His minimum weakness in the right grip and hip flexors which improved as well. He states his done well since discharge. He is tolerating aspirin and Plavix without bleeding or bruising. He is also on Lipitor and tolerating it without myalgias or arthralgias. He states his blood pressure is good. Today it is 100/70. He quit smoking in spring of 2016. He has not yet seen Dr. the mesh were for follow-up. Patient wants to have a colonoscopy done because he was having some blood in his stools even prior to his stroke. Update 01/25/16 : He returns for follow-up after last visit 6 months ago. Continue to do well without recurrent stroke or TIA symptoms. He is still living with his dad but his sister is accompanying him today. Patient has not had done colonoscopy as he wanted to wait due to stroke risk. He had follow-up carotid ultrasound done on 11/22/15 which I have reviewed shows chronically occluded left common carotid, left vertebral and subclavian arteries. Right internal carotid artery shows elevated velocities in the 60-79% range and right subclavian greater than 50% stenosis. Patient remains on aspirin and Plavix tolerating it well with only minor bruising and no bleeding episodes. Patient does have a family history of colon cancer and his colonoscopy is overdue and he has questions about stroke risk while holding antiplatelet therapy for the procedure. [name] Update 08/09/2016 ; he returns for follow-up after last visit 6 months ago. He continues to do well without recurrent stroke or TIA symptoms. He remains on aspirin and Plavix which is tolerating well without bruising or bleeding. He had a follow-up carotid  ultrasound done in the vascular surgery lab on 05/22/16 which showed persistent chronic left ICA occlusion with 50-69% right ICA and greater than 50% right subclavian stenosis. Patient denies any  symptoms of subclavian steal lower TIA or stroke. States her blood pressure is well controlled. He is tolerating his cholesterol medication without muscle aches or pains. Her is not sure when he had his last lipid profile checked. ROS:   14 system review of systems is positive for     No  complaints today   PMH:  Past Medical History:  Diagnosis Date  . COPD (chronic obstructive pulmonary disease) ([place])   . CVD (cerebrovascular disease)   . Stenosis of right vertebral artery   . Stroke ([place]mond Carney)   . Subclavian artery stenosis, right [facility])     Social History:  Social History   Social History  . Marital status: Married    Spouse name: N/A  . Number of children: N/A  . Years of education: N/A   Occupational History  . Not on file.   Social History Main Topics  . Smoking status: Former Smoker    Packs/day: 0.50    Quit date: 01/22/2014  . Smokeless tobacco: Never Used  . Alcohol use No  . Drug use: Unknown  . Sexual activity: Not on file   Other Topics Concern  . Not on file   Social History Narrative  . No narrative on file    Medications:   Current Outpatient Prescriptions on File Prior to Visit  Medication Sig Dispense Refill  . albuterol (PROVENTIL HFA;VENTOLIN HFA) 108 (90 BASE) MCG/ACT inhaler Inhale 2 puffs into the lungs every 4 (four) hours as needed for wheezing or shortness of breath.    [name] aspirin 325 MG tablet Take 325 mg by mouth daily.    . clopidogrel (PLAVIX) 75 MG tablet Take 75 mg by mouth daily.  2   No current facility-administered medications on file prior to visit.     Allergies:  No Known Allergies  Physical Exam General: Frail middle-aged Caucasian male seated, in no evident distress Head: head normocephalic and atraumatic.  Neck: supple with soft right carotid   bruit and right vertebral and basilar bruits. Cardiovascular: regular rate and rhythm, no murmurs Musculoskeletal: no deformity Skin:  no rash/petichiae Vascular:  Normal pulses all  extremities. Right ocular, subclavian and carotid bruit heard. No pulses felt in left upper extremity. And febrile in right upper extremity. Vitals:   08/09/16 1459  BP: 100/70  Pulse: 65   Neurologic Exam Mental Status: Awake and fully alert. Oriented to place and time. Recent and remote memory intact. Attention span, concentration and fund of knowledge appropriate. Mood and affect appropriate.  Cranial Nerves: Fundoscopic exam not done  . Pupils equal, briskly reactive to light. Extraocular movements full without nystagmus. Visual fields full to confrontation. Hearing intact. Facial sensation intact. Face, tongue, palate moves normally and symmetrically.  Motor: Normal bulk and tone. Normal strength in all tested extremity muscles. Sensory.: intact to touch ,pinprick .position and vibratory sensation.  Coordination: Rapid alternating movements normal in all extremities. Finger-to-nose and heel-to-shin performed accurately bilaterally. Gait and Station: Arises from chair without difficulty. Stance is normal. Gait demonstrates normal stride length and balance . Unable to heel, toe and tandem walk without difficulty.  Reflexes: 1+ and symmetric. Toes downgoing.      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 Patient underwent successful right subclavian angioplasty on 05/26/2015    PLAN: I had a long d/w patient about his remote stroke, carotid occlusion and stenosis,risk for recurrent stroke/TIAs, personally independently reviewed imaging studies and stroke evaluation results and answered questions.Continue aspirin 81 mg daily, aspirin 325 mg daily and clopidogrel 75 mg daily  for secondary stroke prevention and maintain strict control of hypertension with blood pressure goal below 130/90,  diabetes with hemoglobin A1c goal below 6.5% and lipids with LDL cholesterol goal below 70 mg/dL. I also advised the patient to eat a healthy diet with plenty of whole grains, cereals, fruits and vegetables, exercise regularly and maintain ideal body weight .he was advised to avoid dehydration and maintain adequate hydration. Continue close follow-up with vascular surgery to follow his extracranial carotid and subclavian stenosis. Followup in the future with me in one year or call earlier if necessary   Antony Contras, MD  Note: This document was prepared with digital dictation and possible smart phrase technology. Any transcriptional errors that result from this process are unintentional

## 2016-08-09 NOTE — Patient Instructions (Signed)
I had a long d/w patient about his remote stroke, carotid occlusion and stenosis,risk for recurrent stroke/TIAs, personally independently reviewed imaging studies and stroke evaluation results and answered questions.Continue aspirin 81 mg daily, aspirin 325 mg daily and clopidogrel 75 mg daily  for secondary stroke prevention and maintain strict control of hypertension with blood pressure goal below 130/90, diabetes with hemoglobin A1c goal below 6.5% and lipids with LDL cholesterol goal below 70 mg/dL. I also advised the patient to eat a healthy diet with plenty of whole grains, cereals, fruits and vegetables, exercise regularly and maintain ideal body weight .he was advised to avoid dehydration and maintain adequate hydration. Continue close follow-up with vascular surgery to follow his extracranial carotid and subclavian stenosis. Followup in the future with me in one year or call earlier if necessary

## 2016-09-26 DIAGNOSIS — I771 Stricture of artery: Secondary | ICD-10-CM | POA: Diagnosis not present

## 2016-09-26 DIAGNOSIS — J449 Chronic obstructive pulmonary disease, unspecified: Secondary | ICD-10-CM | POA: Diagnosis not present

## 2016-09-26 DIAGNOSIS — E785 Hyperlipidemia, unspecified: Secondary | ICD-10-CM | POA: Diagnosis not present

## 2016-09-26 DIAGNOSIS — Z96643 Presence of artificial hip joint, bilateral: Secondary | ICD-10-CM | POA: Diagnosis not present

## 2016-09-26 DIAGNOSIS — G459 Transient cerebral ischemic attack, unspecified: Secondary | ICD-10-CM | POA: Diagnosis not present

## 2017-03-09 IMAGING — XA IR CARO CERE HEAD/NECK UNILAT RIGHT (MS)
2 of 4 series · 11 of 24 positions shown · IV contrast (IODINE)
Comparison: none

CLINICAL DATA: Blurry vision in left eye for 3 weeks. Occluded left
common carotid artery on ultrasound.

[Series 5: carotid 1 · 1 of 1 slices shown]
[im 1/1]
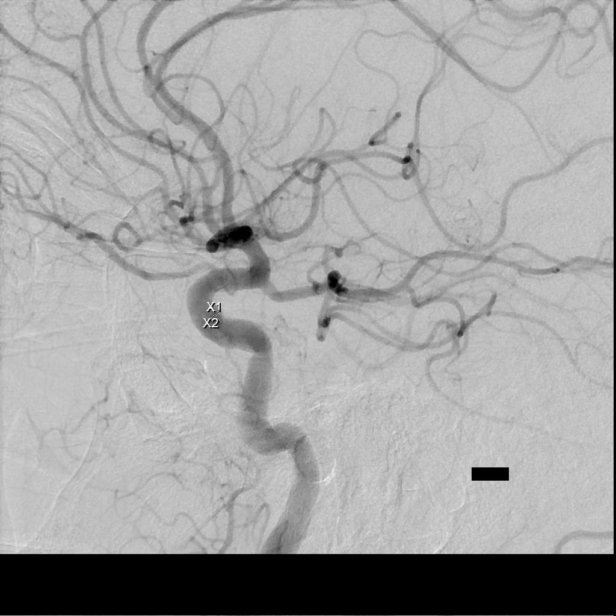

[Series 300: neuro · 10 of 118 slices shown]
[im 1/118]
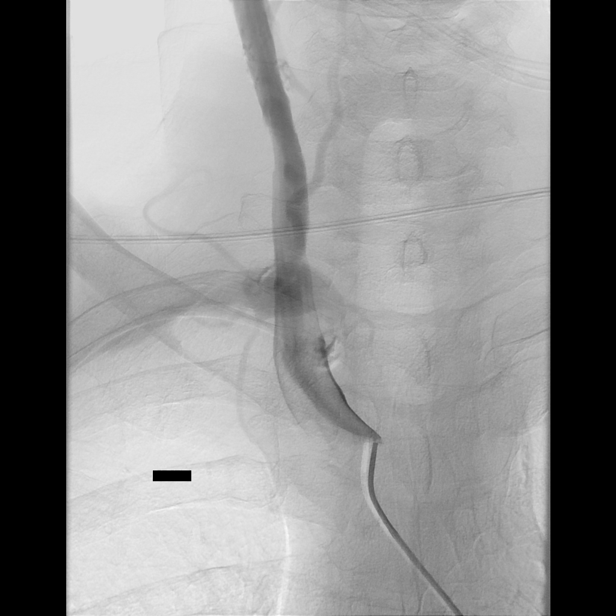
[im 12/118]
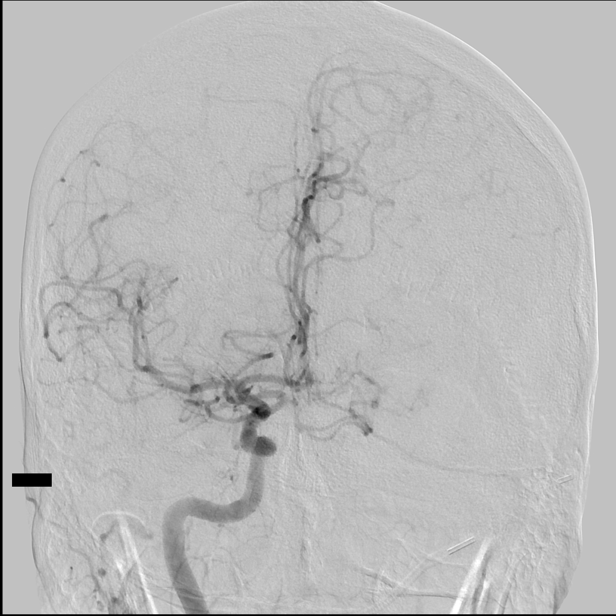
[im 24/118]
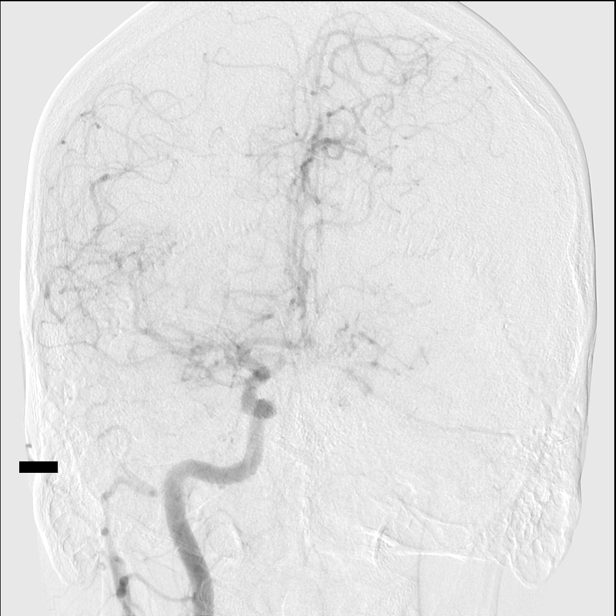
[im 36/118]
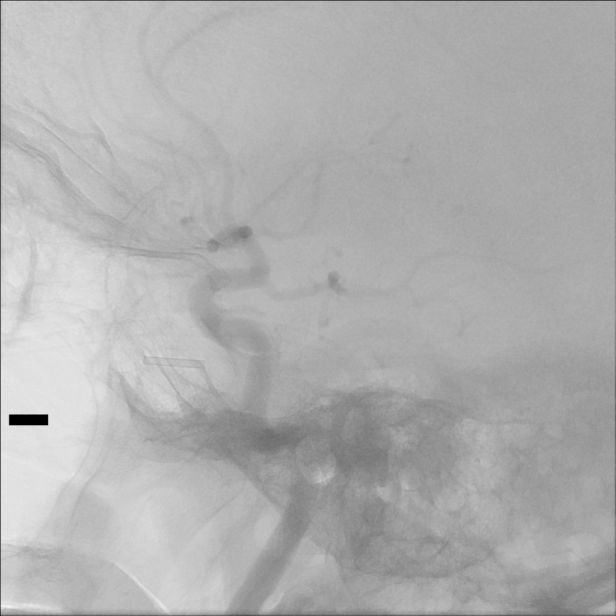
[im 53/118]
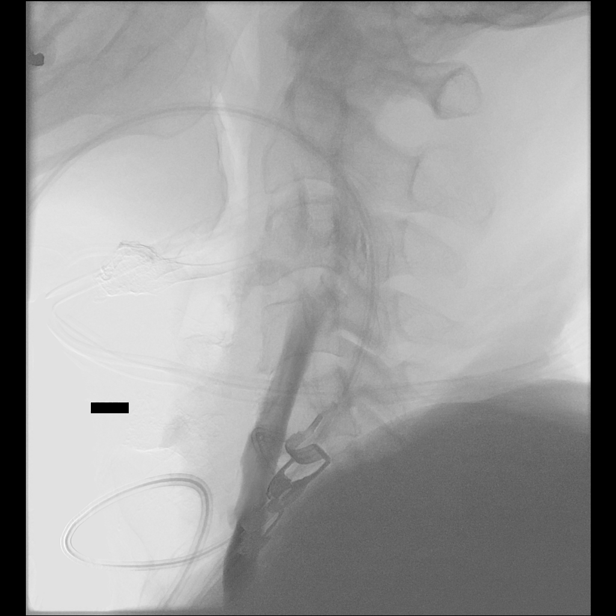
[im 65/118]
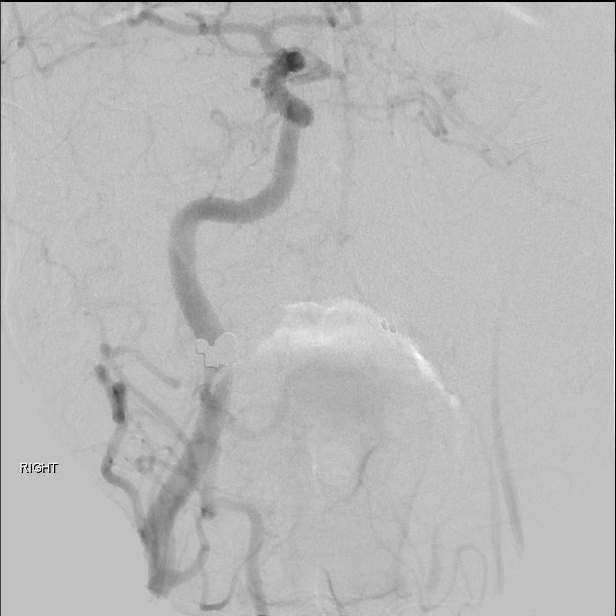
[im 77/118]
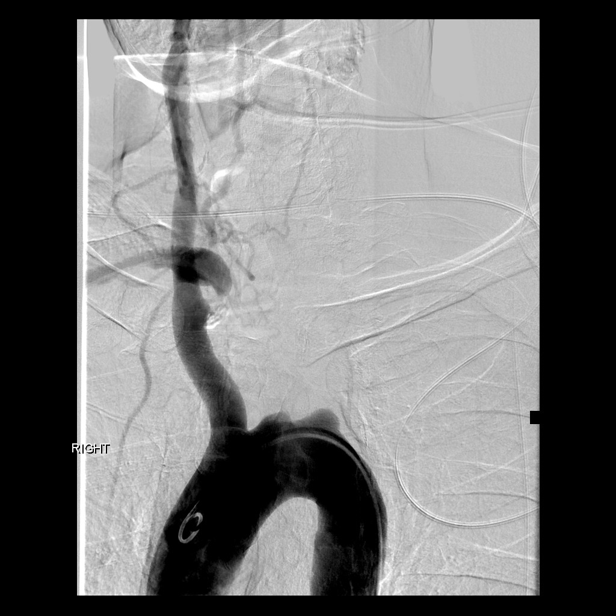
[im 88/118]
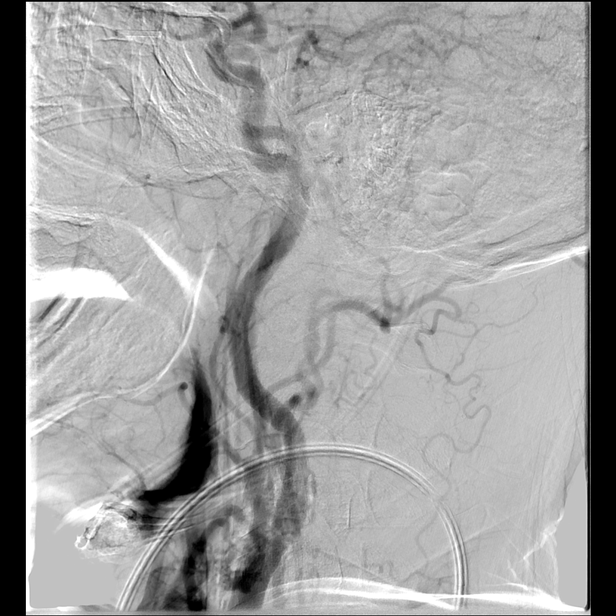
[im 100/118]
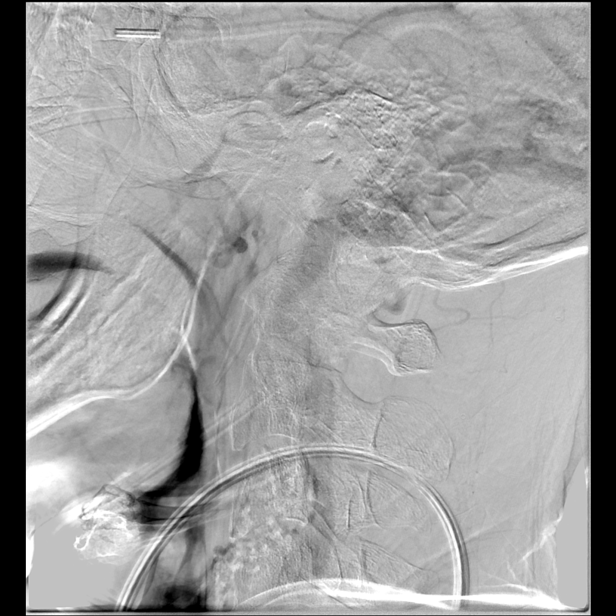
[im 112/118]
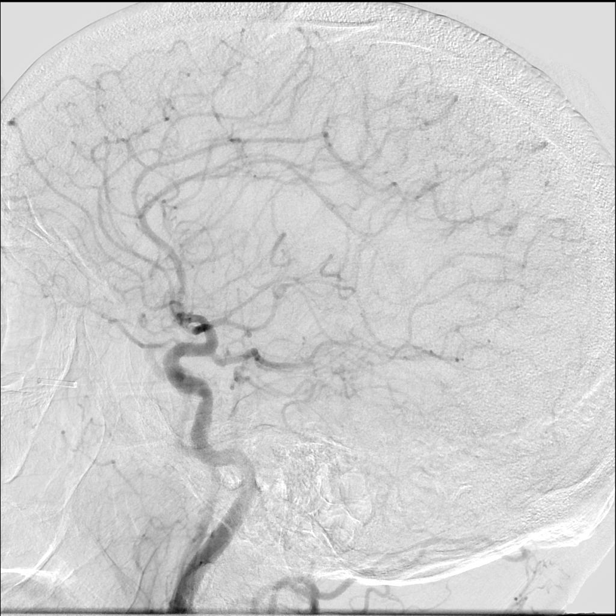

[11 of 24 positions shown; findings below may reference images not displayed]

EXAM:
RIGHT COMMON CAROTID ARTERIOGRAM AND INNOMINATE ARTERIOGRAM AND ARCH
AORTOGRAM. IR ANGIO INTRA EXTRACRAN SEL COM CAROTID INNOMINATE UNI
RIGHT MOD SED

ANESTHESIA/SEDATION:
Conscious sedation.

MEDICATIONS:
Fentanyl 25 mcg IV.

CONTRAST:  100mL OMNIPAQUE IOHEXOL 300 MG/ML  SOLN

PROCEDURE:
Following a full explanation of the procedure along with the
potential associated complications, an informed witnessed consent
was obtained.

The right groin was prepped and draped in the usual sterile fashion.
Thereafter using modified Seldinger technique, transfemoral access
into the right common femoral artery was obtained without
difficulty. Over a 0.035 inch guidewire, a 5 French Pinnacle sheath
was inserted. Through this, and also over a 0.035 inch guidewire, a
5 French JB1 catheter was advanced to the aortic arch region and
selectively positioned in the innominate artery and the right common
carotid artery. This was then exchanged for a 5 French pigtail
catheter which was positioned in the ascending thoracic aorta. An
arteriogram was then perfor[REDACTED]ed over the brachiocephalic
vessels, and also intracranially.

COMPLICATIONS:
None immediate.
FINDINGS: The innominate artery injection demonstrates a severe 95% plus
stenosis of the right subclavian artery at its origin associated
with a significant post stenotic dilatation. There is a 50%
narrowing of the right subclavian artery distal to the origin of the
right vertebral artery.

The right vertebral artery itself demonstrates a 95% stenosis at its
origin.

The vessel is seen to opacify to the cranial skull base where it
opacifies the right posterior inferior cerebellar artery. The
delayed arterial phase also demonstrates antegrade flow into the
right vertebrobasilar junction and the proximal basilar artery.

There also appears to be flow in the distal basilar artery, with
faint opacification of the anterior inferior cerebellar arteries.

The delayed arterial phase also demonstrates the faint opacification
of the left vertebral artery which is probably retrograde flow from
the left occipital artery from the transverse branches arising from
the right external carotid artery branches.

The right common carotid arteriogram demonstrates a tight focal
stenosis at the origin of the right external carotid artery. Its
branches, however, are normally opacified.

A prominent inferior thyroidal artery is noted projecting inferiorly
and medially.

The right internal carotid artery demonstrates a diffuse plaque at
the level of the bulb. There is a mild associated stenosis secondary
to a plaque shelf which is not hemodynamically significant. No
ulcerations are seen.

The right internal carotid artery is seen to opacify normally to the
cranial skull base. The petrous, the cavernous and the supraclinoid
segments are widely patent.

The caval cavernous segment demonstrates a flame-shaped aneurysm
projecting inferiorly measuring approximately 3.6 mm x 2 mm.

The right middle and the right anterior cerebral artery are seen to
opacify normally into the capillary and venous phases. Cross
opacification via the anterior communicating artery of the left
anterior cerebral artery via A2 segment is seen.

The delayed arterial phase demonstrates retrograde opacification of
the left MCA and the left ACA distributions from the left meningeal
and the callosal marginal branches. Additionally seen is a delayed
appearance of the left internal carotid artery from collaterals
arising from the external carotid artery on the right with a
transverse anastomosis of the left external carotid artery branches
with retrograde opacification of the left internal carotid artery
and subsequently antegrade flow distally to the skull base. There is
opacification of the left supraclinoid segment and also of the left
posterior communicating artery.

The right internal carotid artery injection also demonstrates a
prominent right posterior communicating artery with opacification of
the right posterior cerebral and the superior cerebellar artery
distributions.

The delayed arterial phase which demonstrates the left internal
carotid artery also demonstrates opacification of left posterior
communicating artery.

An arteriogram demonstrates wide patency of the innominate artery.
Again demonstrated is the tight focal stenosis at the origin of the
right subclavian artery as mentioned above.

There is a complete occlusion of the left common carotid artery
associated with a stump. Also noted is complete occlusion of the
left subclavian artery associated with a stump.

The delayed arterial phase demonstrates opacification of the left
subclavian artery via retrograde flow from the ipsilateral left
vertebral artery. Also seen are faint transverse tributaries arising
from the thyrocervical trunk especially inferior thyroidal artery
which crosses the midline and anastomosed with the vertebral artery
at the level of C4. Intracranially the arteriogram demonstrates
again delayed opacification of the left internal carotid artery to
the cranial skull base, and also of the left middle cerebral artery
distribution branches retrogradely with faint opacification of the
left middle cerebral artery probably retrogradely and antegradely.
This is as discussed above.
IMPRESSION: Occluded left common carotid artery and left subclavian artery.

High-grade 95% plus stenosis of the right subclavian artery at its
origin, and also of the origin of the right vertebral artery of 95%
plus.

Left subclavian steal.

Retrograde and antegrade reconstitution of the left middle cerebral
artery distribution via the anterior communicating artery and also
from reconstitution of the left internal carotid artery from the
left external carotid artery branches which themselves opacify via
collaterals from the right external carotid artery branches.

The angiographic findings were reviewed with the patient and the
patient's family. In view of the above findings skull the patient
was advised to start aspirin 325 mg per day. The option of
endovascular revascularization of the right subclavian artery was
also briefly discussed with the patient and the patient's family.
This will be to augment flow into the posterior circulation. The
patient and his family will discuss further and reach a conclusion
as to whether they would want to proceed with endovascular
revascularization of the pre occlusive stenosis of the right
subclavian artery origin.

## 2017-03-22 DIAGNOSIS — E7800 Pure hypercholesterolemia, unspecified: Secondary | ICD-10-CM | POA: Diagnosis not present

## 2017-03-22 DIAGNOSIS — I771 Stricture of artery: Secondary | ICD-10-CM | POA: Diagnosis not present

## 2017-03-22 DIAGNOSIS — J449 Chronic obstructive pulmonary disease, unspecified: Secondary | ICD-10-CM | POA: Diagnosis not present

## 2017-03-22 DIAGNOSIS — G459 Transient cerebral ischemic attack, unspecified: Secondary | ICD-10-CM | POA: Diagnosis not present

## 2017-03-22 DIAGNOSIS — Z96643 Presence of artificial hip joint, bilateral: Secondary | ICD-10-CM | POA: Diagnosis not present

## 2017-08-20 ENCOUNTER — Ambulatory Visit: Payer: Medicare Other | Admitting: Neurology

## 2017-08-21 ENCOUNTER — Telehealth: Payer: Self-pay

## 2017-08-21 ENCOUNTER — Encounter: Payer: Self-pay | Admitting: Neurology

## 2017-08-21 NOTE — Telephone Encounter (Signed)
Patient no show for appt on 08/20/2017.

## 2018-02-21 DIAGNOSIS — Z96643 Presence of artificial hip joint, bilateral: Secondary | ICD-10-CM | POA: Diagnosis not present

## 2018-02-21 DIAGNOSIS — J449 Chronic obstructive pulmonary disease, unspecified: Secondary | ICD-10-CM | POA: Diagnosis not present

## 2018-02-21 DIAGNOSIS — G459 Transient cerebral ischemic attack, unspecified: Secondary | ICD-10-CM | POA: Diagnosis not present

## 2018-02-21 DIAGNOSIS — I771 Stricture of artery: Secondary | ICD-10-CM | POA: Diagnosis not present

## 2018-02-21 DIAGNOSIS — R0989 Other specified symptoms and signs involving the circulatory and respiratory systems: Secondary | ICD-10-CM | POA: Diagnosis not present

## 2018-02-21 DIAGNOSIS — E7800 Pure hypercholesterolemia, unspecified: Secondary | ICD-10-CM | POA: Diagnosis not present

## 2018-02-26 ENCOUNTER — Other Ambulatory Visit: Payer: Self-pay

## 2018-02-26 DIAGNOSIS — R0989 Other specified symptoms and signs involving the circulatory and respiratory systems: Secondary | ICD-10-CM

## 2018-02-27 ENCOUNTER — Telehealth: Payer: Self-pay

## 2018-02-27 NOTE — Telephone Encounter (Signed)
SENT REFERRAL TO SCHEDULING 

## 2018-03-12 ENCOUNTER — Encounter: Payer: Self-pay | Admitting: Physician Assistant

## 2018-03-29 ENCOUNTER — Encounter: Payer: Medicare Other | Admitting: Vascular Surgery

## 2018-03-29 ENCOUNTER — Encounter (HOSPITAL_COMMUNITY): Payer: Medicare Other

## 2018-04-02 ENCOUNTER — Encounter (INDEPENDENT_AMBULATORY_CARE_PROVIDER_SITE_OTHER): Payer: Self-pay

## 2018-04-02 ENCOUNTER — Ambulatory Visit (INDEPENDENT_AMBULATORY_CARE_PROVIDER_SITE_OTHER): Payer: Medicare Other | Admitting: Physician Assistant

## 2018-04-02 ENCOUNTER — Encounter: Payer: Self-pay | Admitting: Physician Assistant

## 2018-04-02 VITALS — BP 108/74 | HR 63 | Ht 68.0 in | Wt 141.4 lb

## 2018-04-02 DIAGNOSIS — Z8673 Personal history of transient ischemic attack (TIA), and cerebral infarction without residual deficits: Secondary | ICD-10-CM

## 2018-04-02 DIAGNOSIS — I779 Disorder of arteries and arterioles, unspecified: Secondary | ICD-10-CM | POA: Diagnosis not present

## 2018-04-02 DIAGNOSIS — E785 Hyperlipidemia, unspecified: Secondary | ICD-10-CM | POA: Diagnosis not present

## 2018-04-02 DIAGNOSIS — I739 Peripheral vascular disease, unspecified: Secondary | ICD-10-CM

## 2018-04-02 MED ORDER — ASPIRIN EC 81 MG PO TBEC: 81.0000 mg | DELAYED_RELEASE_TABLET | Freq: Every day | ORAL | 3 refills | Status: DC

## 2018-04-02 MED ORDER — ATORVASTATIN CALCIUM 80 MG PO TABS: 80.0000 mg | ORAL_TABLET | Freq: Every day | ORAL | 3 refills | Status: DC

## 2018-04-02 NOTE — Progress Notes (Signed)
Cardiology Office Note:    Date:  04/02/2018   ID:  Raymond Carney, DOB Jul 24, 1961, MRN [id number]  PCP:  Antony Contras, MD  Cardiologist:  New - will est with Candee Furbish, MD and Richardson Dopp, PA-C   Referring MD: Antony Contras, MD   Chief Complaint  Patient presents with  . Hyperlipidemia    History of Present Illness:    Raymond Carney is a 57 y.o. male with  peripheral vascular disease, prior L frontal MCA stroke in July 2016 who is being seen today for the evaluation of hyperlipidemia at the request of Antony Contras, MD.   Raymond Carney was diagnosed with an occluded L common carotid artery, L subclavian artery and 95% stenosis in the R subclavian and R vertebral artery at the time of his stroke.  He underwent R subclavian angioplasty in 7/16.   He was evaluated by Dr. Geraldo Pitter for Cardiology in 2016 in [place] for surgical clearance prior to a colonoscopy.  He has been on Atorvastatin and Zetia for lipid management.  Recent labs with his PCP demonstrate an LDL of 168.  He is here alone today.  He denies any history of chest pain, shortness of breath, syncope. He is fairly active.  He denies significant limitations.  He denies paroxysmal nocturnal dyspnea, edema.  PAD Screen 04/02/2018  Previous PAD dx? No  Previous surgical procedure? No  Pain with walking? No  Feet/toe relief with dangling? No  Painful, non-healing ulcers? No  Extremities discolored? No    Prior CV studies:   The following studies were reviewed today:  Carotid US 05/22/2016 Summary: Right internal carotid artery 60-79% stenosis. Right vertebral dampened, suggestive of a more proximal obstruction. Right external carotid artery >50% stenosis. Right subclavian artery >50% stenosis.  Left common carotid artery occlusion. Left internal carotid artery occlusion. Left vertebral artery occlusion. Retrograde flow in left external carotid artery. Dampened flow in left subclavian artery, suggestive of a more  proximal obstruction.  Echo 05/25/2015 EF 60-65, normal wall motion   Past Medical History:  Diagnosis Date  . Carotid stenosis    [place] 6440:  R 34-74; LICA occluded; prox L subcl occluded; R subcl > 50%  . COPD (chronic obstructive pulmonary disease) ([place])   . History of stroke    Echo 7/16:  EF 60-65  . Hyperlipidemia   . Pituitary tumor    s/p excision ~ 2000  . Stenosis of right subclavian artery ([facility])    S/P STENT FOLLOWED BY DR. Leonie Man AND DR. [name]     Past Surgical History:  Procedure Laterality Date  . HIP ARTHROPLASTY Bilateral    ON DISABILITY SECONDARY   . OTHER SURGICAL HISTORY  1996   HISTORY OF PITUITARY TUMOR REMOVAL CIRCA  . RADIOLOGY WITH ANESTHESIA N/A 05/26/2015   Procedure: RADIOLOGY WITH ANESTHESIA/ANGIOPLASTY;  Surgeon: Luanne Bras, MD;  Location: [facility];  Service: Radiology;  Laterality: N/A;  . TRANSPHENOIDAL / TRANSNASAL HYPOPHYSECTOMY / RESECTION PITUITARY TUMOR      Current Medications: Current Meds  Medication Sig  . albuterol (PROVENTIL HFA;VENTOLIN HFA) 108 (90 BASE) MCG/ACT inhaler Inhale 2 puffs into the lungs every 4 (four) hours as needed for wheezing or shortness of breath.  [name] aspirin EC 81 MG tablet Take 1 tablet (81 mg total) by mouth daily.  . clopidogrel (PLAVIX) 75 MG tablet Take 75 mg by mouth daily.  [name] ezetimibe (ZETIA) 10 MG tablet   . oxyCODONE-acetaminophen (PERCOCET/ROXICET) 5-325 MG tablet 1 tablet every 6 (six)  hours as needed.   . [DISCONTINUED] atorvastatin (LIPITOR) 40 MG tablet Take 40 mg by mouth daily.     Allergies:   Patient has no known allergies.   Social History   Socioeconomic History  . Marital status: Divorced    Spouse name: Not on file  . Number of children: 1  . Years of education: Not on file  . Highest education level: Not on file  Occupational History  . Occupation: Disabled  Social Needs  . Financial resource strain: Not on file  . Food insecurity:    Worry: Not on file    Inability:  Not on file  . Transportation needs:    Medical: Not on file    Non-medical: Not on file  Tobacco Use  . Smoking status: Former Smoker    Packs/day: 0.50    Last attempt to quit: 01/22/2014    Years since quitting: 4.1  . Smokeless tobacco: Never Used  Substance and Sexual Activity  . Alcohol use: No  . Drug use: Never  . Sexual activity: Not on file  Lifestyle  . Physical activity:    Days per week: Not on file    Minutes per session: Not on file  . Stress: Not on file  Relationships  . Social connections:    Talks on phone: Not on file    Gets together: Not on file    Attends religious service: Not on file    Active member of club or organization: Not on file    Attends meetings of clubs or organizations: Not on file    Relationship status: Not on file  Other Topics Concern  . Not on file  Social History Narrative  . Not on file     Family Hx: The patient's family history includes Atrial fibrillation in his mother; CAD in his father and mother; CVA in his father; Colon cancer (age of onset: 6) in his sister; Congestive Heart Failure in his mother; Hypertension in his father and mother; Prostate cancer in his father; Stroke in his father. There is no history of Colon polyps or Liver cancer.  ROS:   Please see the history of present illness.    ROS All other systems reviewed and are negative.   EKGs/Labs/Other Test Reviewed:    EKG: EKG is  ordered today.  The ekg ordered today demonstrates normal sinus rhythm HR 63, normal axis, LVH, QTc 419 ms, similar to prior tracing from 04/2015   Recent Labs: No results found for requested labs within last 8760 hours.   Recent Lipid Panel Lab Results  Component Value Date/Time   CHOL 235 (H) 05/24/2015 02:22 AM   TRIG 87 05/24/2015 02:22 AM   HDL 25 (L) 05/24/2015 02:22 AM   CHOLHDL 9.4 05/24/2015 02:22 AM   LDLCALC 193 (H) 05/24/2015 02:22 AM    Physical Exam:    VS:  BP 108/74   Pulse 63   Ht 5\' 8"  (1.727 m)   Wt  141 lb 6.4 oz (64.1 kg)   SpO2 98%   BMI 21.50 kg/m     Wt Readings from Last 3 Encounters:  04/02/18 141 lb 6.4 oz (64.1 kg)  08/09/16 135 lb (61.2 kg)  01/25/16 141 lb 3.2 oz (64 kg)     Physical Exam  Constitutional: He is oriented to person, place, and time. He appears well-developed and well-nourished. No distress.  HENT:  Head: Normocephalic and atraumatic.  Neck: Neck supple. No JVD present.  Cardiovascular: Normal rate,  regular rhythm, S1 normal and S2 normal.  No murmur heard. Pulmonary/Chest: Breath sounds normal. He has no rales.  Abdominal: Soft. There is no hepatomegaly.  Musculoskeletal: He exhibits no edema.  Neurological: He is alert and oriented to person, place, and time.  Skin: Skin is warm and dry.    ASSESSMENT & PLAN:    Hyperlipidemia, unspecified hyperlipidemia type He has an LDL of 168 despite treatment with atorvastatin 40 mg and ezetimibe 10 mg.  He notes compliance with both medications.  His primary care physician would like him to be seen by the lipid clinic for consideration of PCSK9 inhibitor therapy.  I have recommended increasing his atorvastatin to 80 mg daily.  I will arrange follow-up lipids and LFTs in 6 weeks.  I will refer him to our clinical pharmacist in the lipid clinic for further evaluation and management.  -Increase Lipitor to 80 mg Once daily   -Continue Zetia 10 mg QD.  -Check Lipids and LFTs in 6 weeks  -Refer to [facility] for consideration of PCSK9 inhibitor   History of stroke He has a prior history of right subclavian artery angioplasty in 2016 after his stroke.  He was previously followed by neurology.  He is on aspirin 325 mg daily in addition to Plavix 75 mg daily.  As it has been 3 years since his stroke, he likely does not need to remain on high-dose aspirin.  -Decrease aspirin 81 mg daily  -Continue Plavix  -Continue statin  Bilateral carotid artery disease, unspecified type ([place]) He has an occluded left internal  carotid artery and 60-79% stenosis by prior carotid Doppler.  Continue aspirin, Plavix, statin.  He has an appointment pending with vascular surgery.   Dispo:  Return in about 1 year (around 04/03/2019) for Routine Follow Up, w/ Richardson Dopp, PA-C.   Medication Adjustments/Labs and Tests Ordered: Current medicines are reviewed at length with the patient today.  Concerns regarding medicines are outlined above.  Orders/Tests:  Orders Placed This Encounter  Procedures  . Lipid Profile  . Hepatic function panel  . AMB Referral to [facility]  . EKG 12-Lead   Medication changes: Meds ordered this encounter  Medications  . aspirin EC 81 MG tablet    Sig: Take 1 tablet (81 mg total) by mouth daily.    Dispense:  90 tablet    Refill:  3    Order Specific Question:   Supervising Provider    Answer:   Marlou Porch, Darek C [[id number]]  . atorvastatin (LIPITOR) 80 MG tablet    Sig: Take 1 tablet (80 mg total) by mouth daily.    Dispense:  90 tablet    Refill:  3    DOSE INCREASE   Signed, Richardson Dopp, PA-C  04/02/2018 3:44 PM    [facility] [address], [place], [place]  [address] Phone: [phone]; Fax: [phone]

## 2018-04-02 NOTE — Patient Instructions (Signed)
Medication Instructions:  1. DECREASE ASPIRIN TO 38 MG DAILY  2. INCREASE LIPITOR TO 80 MG DAILY; NEW RX HAS BEEN SENT   Labwork: FASTING LIPID AND LIVER PANEL TO BE DONE IN 6 WEEKS ; NOTHING TO EAT AFTER MIDNIGHT THE NIGHT BEFORE LAB WORK, MORNING MEDICATIONS WITH WATER IS FINE THE MORNING OF LAB WORK  Testing/Procedures: NONE ORDERED TODAY  Follow-Up: Your physician wants you to follow-up in: [place], [facility]  You will receive a reminder letter in the mail two months in advance. If you don't receive a letter, please call our office to schedule the follow-up appointment.  YOU ARE BEING REFERRED TO THE LIPID CLINIC IN THE NEXT 6 WEEKS; THIS APPT SHOULD BE A COUPLE OF DAYS AFTER YOU HAVE HAD YOUR LAB WORK DONE.   Any Other Special Instructions Will Be Listed Below (If Applicable).     If you need a refill on your cardiac medications before your next appointment, please call your pharmacy.

## 2018-05-07 ENCOUNTER — Encounter: Payer: Self-pay | Admitting: Vascular Surgery

## 2018-05-07 ENCOUNTER — Other Ambulatory Visit: Payer: Self-pay

## 2018-05-07 ENCOUNTER — Ambulatory Visit (HOSPITAL_COMMUNITY)
Admission: RE | Admit: 2018-05-07 | Discharge: 2018-05-07 | Disposition: A | Discharge status: Home / Self Care | Payer: Medicare Other | Source: Ambulatory Visit | Attending: Vascular Surgery | Admitting: Vascular Surgery

## 2018-05-07 ENCOUNTER — Ambulatory Visit (INDEPENDENT_AMBULATORY_CARE_PROVIDER_SITE_OTHER): Payer: Medicare Other | Admitting: Vascular Surgery

## 2018-05-07 VITALS — BP 122/70 | HR 54 | Temp 97.3°F | Resp 16 | Ht 68.0 in | Wt 138.0 lb

## 2018-05-07 DIAGNOSIS — I6523 Occlusion and stenosis of bilateral carotid arteries: Secondary | ICD-10-CM | POA: Diagnosis not present

## 2018-05-07 DIAGNOSIS — E785 Hyperlipidemia, unspecified: Secondary | ICD-10-CM | POA: Insufficient documentation

## 2018-05-07 DIAGNOSIS — Z87891 Personal history of nicotine dependence: Secondary | ICD-10-CM | POA: Diagnosis not present

## 2018-05-07 DIAGNOSIS — I6521 Occlusion and stenosis of right carotid artery: Secondary | ICD-10-CM | POA: Insufficient documentation

## 2018-05-07 DIAGNOSIS — R0989 Other specified symptoms and signs involving the circulatory and respiratory systems: Secondary | ICD-10-CM | POA: Insufficient documentation

## 2018-05-07 DIAGNOSIS — Z8673 Personal history of transient ischemic attack (TIA), and cerebral infarction without residual deficits: Secondary | ICD-10-CM | POA: Insufficient documentation

## 2018-05-07 NOTE — Progress Notes (Signed)
[facility] [place]  Patient name: Raymond Carney MRN: [id number] DOB: 03-20-1961 Sex: male  REASON FOR CONSULT: Evaluation of extracranial cerebrovascular occlusive disease  HPI: Raymond Carney is a 57 y.o. male, who is of extracranial cerebrovascular occlusive disease.  He has a very complex past history.  He presented with stroke in 2016 and underwent extensive evaluation.  This revealed complete occlusion of his left common carotid artery and left subclavian artery.  He had a high-grade stenosis of his proximal right subclavian artery and underwent successful angioplasty and interventional radiology.  His right vertebral artery was patent.  He did have a moderate right internal carotid artery stenosis at that time.  New neurologic deficits.  Also has a prior history of resection of pituitary tumor around 2000.  He does report lower extremity claudication symptoms but denies rest pain and fortunately has had no lower extremity tissue loss.  Past Medical History:  Diagnosis Date  . Carotid stenosis    [place] 6789:  R 38-10; LICA occluded; prox L subcl occluded; R subcl > 50%  . COPD (chronic obstructive pulmonary disease) ([place])   . History of stroke    Echo 7/16:  EF 60-65  . Hyperlipidemia   . Pituitary tumor    s/p excision ~ 2000  . Stenosis of right subclavian artery (H[facility])    S/P STENT FOLLOWED BY DR. Leonie Man AND DR. [name]     Family History  Problem Relation Age of Onset  . Hypertension Mother   . CAD Mother   . Congestive Heart Failure Mother   . Atrial fibrillation Mother   . Stroke Father   . Hypertension Father   . CVA Father   . CAD Father        s/p CABG  . Prostate cancer Father   . Colon cancer Sister 1  . Colon polyps Neg Hx   . Liver cancer Neg Hx     SOCIAL HISTORY: Social History   Socioeconomic History  . Marital status: Divorced    Spouse name: Not on file  . Number of children: 1  . Years  of education: Not on file  . Highest education level: Not on file  Occupational History  . Occupation: Disabled  Social Needs  . Financial resource strain: Not on file  . Food insecurity:    Worry: Not on file    Inability: Not on file  . Transportation needs:    Medical: Not on file    Non-medical: Not on file  Tobacco Use  . Smoking status: Former Smoker    Packs/day: 0.50    Last attempt to quit: 01/22/2014    Years since quitting: 4.2  . Smokeless tobacco: Never Used  Substance and Sexual Activity  . Alcohol use: No  . Drug use: Never  . Sexual activity: Not on file  Lifestyle  . Physical activity:    Days per week: Not on file    Minutes per session: Not on file  . Stress: Not on file  Relationships  . Social connections:    Talks on phone: Not on file    Gets together: Not on file    Attends religious service: Not on file    Active member of club or organization: Not on file    Attends meetings of clubs or organizations: Not on file    Relationship status: Not on file  . Intimate partner violence:    Fear of current or ex partner:  Not on file    Emotionally abused: Not on file    Physically abused: Not on file    Forced sexual activity: Not on file  Other Topics Concern  . Not on file  Social History Narrative  . Not on file    No Known Allergies  Current Outpatient Medications  Medication Sig Dispense Refill  . albuterol (PROVENTIL HFA;VENTOLIN HFA) 108 (90 BASE) MCG/ACT inhaler Inhale 2 puffs into the lungs every 4 (four) hours as needed for wheezing or shortness of breath.    [name] aspirin EC 81 MG tablet Take 1 tablet (81 mg total) by mouth daily. 90 tablet 3  . atorvastatin (LIPITOR) 80 MG tablet Take 1 tablet (80 mg total) by mouth daily. 90 tablet 3  . clopidogrel (PLAVIX) 75 MG tablet Take 75 mg by mouth daily.  2  . ezetimibe (ZETIA) 10 MG tablet     . oxyCODONE-acetaminophen (PERCOCET/ROXICET) 5-325 MG tablet 1 tablet every 6 (six) hours as needed.       No current facility-administered medications for this visit.     REVIEW OF SYSTEMS:  [X]  denotes positive finding, [ ]  denotes negative finding Cardiac  Comments:  Chest pain or chest pressure:    Shortness of breath upon exertion:    Short of breath when lying flat:    Irregular heart rhythm:        Vascular    Pain in calf, thigh, or hip brought on by ambulation:    Pain in feet at night that wakes you up from your sleep:     Blood clot in your veins:    Leg swelling:         Pulmonary    Oxygen at home:    Productive cough:     Wheezing:         Neurologic    Sudden weakness in arms or legs:     Sudden numbness in arms or legs:     Sudden onset of difficulty speaking or slurred speech:    Temporary loss of vision in one eye:     Problems with dizziness:         Gastrointestinal    Blood in stool:     Vomited blood:         Genitourinary    Burning when urinating:     Blood in urine:        Psychiatric    Major depression:         Hematologic    Bleeding problems:    Problems with blood clotting too easily:        Skin    Rashes or ulcers:        Constitutional    Fever or chills:      PHYSICAL EXAM: Vitals:   05/07/18 0859 05/07/18 0905  BP: 138/80 122/70  Pulse: (!) 54   Resp: 16   Temp: (!) 97.3 F (36.3 C)   TempSrc: Oral   SpO2: 100%   Weight: 138 lb (62.6 kg)   Height: 5\' 8"  (1.727 m)     GENERAL: The patient is a well-nourished male, in no acute distress. The vital signs are documented above. CARDIOVASCULAR: I do not hear carotid bruits.  He does have a 1-2+ right radial pulse and absent left radial pulse.  He has a 1-2+ right femoral pulse and absent left femoral pulse.  Do not palpate popliteal or distal pulses. PULMONARY: There is good air exchange  ABDOMEN: Soft and  non-tender  MUSCULOSKELETAL: There are no major deformities or cyanosis. NEUROLOGIC: No focal weakness or paresthesias are detected. SKIN: There are no ulcers or  rashes noted. PSYCHIATRIC: The patient has a normal affect.  DATA:  I reviewed his prior cerebral and arch arteriogram from 2016.  He did undergo a right carotid duplex in our office today.  This reveals a 40 to 59% right carotid stenosis.  He does have evidence of stenosis in his right subclavian artery.  He does have antegrade flow in his right vertebral artery  MEDICAL ISSUES: Severe diffuse atherosclerotic disease.  Asymptomatic from the standpoint of his carotids.  I explained the significance of his carotid disease and the need for immediate presentation to the emergency room should he develop any neurologic deficits.  We will plan to see him again in 1 year with repeat carotid duplex.  He does have significant lower extremity arterial insufficiency by physical exam and history.  He is not limited by his claudication and I would not recommend any further work-up or treatment.  I would recommend work-up only for rest pain and tissue loss.  He knows to notify us should he develop any wound issues on his feet.  Otherwise we will be seen again in 1 year   Rosetta Posner, MD [facility] Tel [phone] Pager [phone]

## 2018-05-14 ENCOUNTER — Other Ambulatory Visit: Payer: Medicare Other

## 2018-05-21 ENCOUNTER — Ambulatory Visit: Payer: Medicare Other

## 2018-11-11 ENCOUNTER — Ambulatory Visit: Payer: Medicare Other | Admitting: Neurology

## 2019-04-04 ENCOUNTER — Telehealth: Payer: Self-pay | Admitting: *Deleted

## 2019-04-04 NOTE — Telephone Encounter (Signed)
Confirm consent - "In the setting of the current Covid19 crisis, you are scheduled for a (phone or video) visit with your provider on (date) at (time).  Just as we do with many in-office visits, in order for you to participate in this visit, we must obtain consent.  If you'd like, I can send this to your mychart (if signed up) or email for you to review.  Otherwise, I can obtain your verbal consent now.  All virtual visits are billed to your insurance company just like a normal visit would be.  By agreeing to a virtual visit, we'd like you to understand that the technology does not allow for your provider to perform an examination, and thus may limit your provider's ability to fully assess your condition. If your provider identifies any concerns that need to be evaluated in person, we will make arrangements to do so.  Finally, though the technology is pretty good, we cannot assure that it will always work on either your or our end, and in the setting of a video visit, we may have to convert it to a phone-only visit.  In either situation, we cannot ensure that we have a secure connection.  Are you willing to proceed?" STAFF: Did the patient verbally acknowledge consent to telehealth visit? Document YES/NO here:  YES   TELEPHONE CALL NOTE  Raymond Carney has been deemed a candidate for a follow-up tele-health visit to limit community exposure during the Covid-19 pandemic. I spoke with the patient via phone to ensure availability of phone/video source, confirm preferred email & phone number, and discuss instructions and expectations.  I reminded Raymond Carney to be prepared with any vital sign and/or heart rhythm information that could potentially be obtained via home monitoring, at the time of his visit. I reminded Raymond Carney to expect a phone call prior to his visit.  Claude Manges, [place] 04/04/2019 10:35 AM     FULL LENGTH CONSENT FOR TELE-HEALTH VISIT   I hereby voluntarily request, consent  and authorize [facility] and its employed or contracted physicians, physician assistants, nurse practitioners or other licensed health care professionals (the Practitioner), to provide me with telemedicine health care services (the "Services") as deemed necessary by the treating Practitioner. I acknowledge and consent to receive the Services by the Practitioner via telemedicine. I understand that the telemedicine visit will involve communicating with the Practitioner through live audiovisual communication technology and the disclosure of certain medical information by electronic transmission. I acknowledge that I have been given the opportunity to request an in-person assessment or other available alternative prior to the telemedicine visit and am voluntarily participating in the telemedicine visit.  I understand that I have the right to withhold or withdraw my consent to the use of telemedicine in the course of my care at any time, without affecting my right to future care or treatment, and that the Practitioner or I may terminate the telemedicine visit at any time. I understand that I have the right to inspect all information obtained and/or recorded in the course of the telemedicine visit and may receive copies of available information for a reasonable fee.  I understand that some of the potential risks of receiving the Services via telemedicine include:  [name] Delay or interruption in medical evaluation due to technological equipment failure or disruption; . Information transmitted may not be sufficient (e.g. poor resolution of images) to allow for appropriate medical decision making by the Practitioner; and/or  . In rare  instances, security protocols could fail, causing a breach of personal health information.  Furthermore, I acknowledge that it is my responsibility to provide information about my medical history, conditions and care that is complete and accurate to the best of my ability. I acknowledge  that Practitioner's advice, recommendations, and/or decision may be based on factors not within their control, such as incomplete or inaccurate data provided by me or distortions of diagnostic images or specimens that may result from electronic transmissions. I understand that the practice of medicine is not an exact science and that Practitioner makes no warranties or guarantees regarding treatment outcomes. I acknowledge that I will receive a copy of this consent concurrently upon execution via email to the email address I last provided but may also request a printed copy by calling the office of [facility].    I understand that my insurance will be billed for this visit.   I have read or had this consent read to me. . I understand the contents of this consent, which adequately explains the benefits and risks of the Services being provided via telemedicine.  . I have been provided ample opportunity to ask questions regarding this consent and the Services and have had my questions answered to my satisfaction. . I give my informed consent for the services to be provided through the use of telemedicine in my medical care  By participating in this telemedicine visit I agree to the above.

## 2019-04-08 ENCOUNTER — Encounter: Payer: Medicare Other | Admitting: Physician Assistant

## 2019-04-08 ENCOUNTER — Other Ambulatory Visit: Payer: Self-pay

## 2019-04-08 VITALS — Ht 68.0 in

## 2019-04-08 NOTE — Progress Notes (Deleted)
{Choose 1 Note Type (Telehealth Visit or Telephone Visit):[phone]}   Date:  04/08/2019   ID:  Raymond Carney, DOB [birth year], MRN [id number]  {Patient Location:[phone]::"Home"} {Provider Location:[phone]::"Home"}  PCP:  Antony Contras, MD  Cardiologist:  Candee Furbish, MD / Richardson Dopp, PA-C  Electrophysiologist:  None  Vascular Surgeon:  Curt Jews, MD  Evaluation Performed:  {Choose Visit Type:[phone]::"Follow-Up Visit"}  Chief Complaint:  ***  History of Present Illness:    Raymond Carney is a 58 y.o. 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  He established with me and Dr. Marlou Porch in June 2019.  ***  The patient {does/does not:200015} have symptoms concerning for COVID-19 infection (fever, chills, cough, or new shortness of breath).    Past Medical History:  Diagnosis Date  . Carotid stenosis    [place] 0160:  R 10-93; LICA occluded; prox L subcl occluded; R subcl > 50%  . COPD (chronic obstructive pulmonary disease) ([place])   . History of stroke    Echo 7/16:  EF 60-65  . Hyperlipidemia   . Pituitary tumor    s/p excision ~ 2000  . Stenosis of right subclavian artery (HCC)    S/P STENT FOLLOWED BY DR. Leonie Man AND DR. [name]    Past Surgical History:  Procedure Laterality Date  . HIP ARTHROPLASTY Bilateral    ON DISABILITY SECONDARY   . OTHER SURGICAL HISTORY  1996   HISTORY OF PITUITARY TUMOR REMOVAL CIRCA  . RADIOLOGY WITH ANESTHESIA N/A 05/26/2015   Procedure: RADIOLOGY WITH ANESTHESIA/ANGIOPLASTY;  Surgeon: Luanne Bras, MD;  Location: [place];  Service: Radiology;  Laterality: N/A;  . TRANSPHENOIDAL / TRANSNASAL HYPOPHYSECTOMY / RESECTION PITUITARY TUMOR       No outpatient medications have been marked as taking for the 04/08/19 encounter (Appointment) with Richardson Dopp T, PA-C.     Allergies:   Patient has no known  allergies.   Social History   Tobacco Use  . Smoking status: Former Smoker    Packs/day: 0.50    Last attempt to quit: 01/22/2014    Years since quitting: 5.2  . Smokeless tobacco: Never Used  Substance Use Topics  . Alcohol use: No  . Drug use: Never     Family Hx: The patient's family history includes Atrial fibrillation in his mother; CAD in his father and mother; CVA in his father; Colon cancer (age of onset: 55) in his sister; Congestive Heart Failure in his mother; Hypertension in his father and mother; Prostate cancer in his father; Stroke in his father. There is no history of Colon polyps or Liver cancer.  ROS:   Please see the history of present illness.    *** All other systems reviewed and are negative.   Prior CV studies:   The following studies were reviewed today:  *** Carotid US 05/07/18 Final Interpretation: Right Carotid: Velocities in the right ICA are consistent with a 40-59%                stenosis. The ECA appears >50% stenosed. Acoustic shadowing may                underestimate higher velocities. Vertebrals:  Right vertebral artery demonstrates antegrade flow. Subclavians: Right subclavian artery was stenotic.  Carotid US 05/22/2016 Summary: Right internal carotid artery 60-79% stenosis. Right vertebral dampened, suggestive of a more proximal obstruction. Right external carotid artery >50% stenosis. Right  subclavian artery >50% stenosis.  Left common carotid artery occlusion. Left internal carotid artery occlusion. Left vertebral artery occlusion. Retrograde flow in left external carotid artery. Dampened flow in left subclavian artery, suggestive of a more proximal obstruction.   Echo 05/25/2015 EF 60-65, normal wall motion  Labs/Other Tests and Data Reviewed:    EKG:  {EKG/Telemetry Strips [phone]}  Recent Labs: No results found for requested labs within last 8760 hours.   Recent Lipid Panel Lab Results  Component Value  Date/Time   CHOL 235 (H) 05/24/2015 02:22 AM   TRIG 87 05/24/2015 02:22 AM   HDL 25 (L) 05/24/2015 02:22 AM   CHOLHDL 9.4 05/24/2015 02:22 AM   LDLCALC 193 (H) 05/24/2015 02:22 AM    Wt Readings from Last 3 Encounters:  05/07/18 138 lb (62.6 kg)  04/02/18 141 lb 6.4 oz (64.1 kg)  08/09/16 135 lb (61.2 kg)     Objective:    Vital Signs:  There were no vitals taken for this visit.   {[facility]Care Virtual Exam (Optional):[phone]::"VITAL SIGNS:  reviewed"}  ASSESSMENT & PLAN:    1. *** Hyperlipidemia, unspecified hyperlipidemia type  Bilateral carotid artery disease, unspecified type (HCC)  PAD (peripheral artery disease) (HCC)  Educated About Covid-19 Virus Infection  Hyperlipidemia, unspecified hyperlipidemia type He has an LDL of 168 despite treatment with atorvastatin 40 mg and ezetimibe 10 mg.  He notes compliance with both medications.  His primary care physician would like him to be seen by the lipid clinic for consideration of PCSK9 inhibitor therapy.  I have recommended increasing his atorvastatin to 80 mg daily.  I will arrange follow-up lipids and LFTs in 6 weeks.  I will refer him to our clinical pharmacist in the lipid clinic for further evaluation and management.             -Increase Lipitor to 80 mg Once daily              -Continue Zetia 10 mg QD.             -Check Lipids and LFTs in 6 weeks             -Refer to [facility] for consideration of PCSK9 inhibitor    History of stroke He has a prior history of right subclavian artery angioplasty in 2016 after his stroke.  He was previously followed by neurology.  He is on aspirin 325 mg daily in addition to Plavix 75 mg daily.  As it has been 3 years since his stroke, he likely does not need to remain on high-dose aspirin.             -Decrease aspirin 81 mg daily             -Continue Plavix             -Continue statin   Bilateral carotid artery disease, unspecified type ([place]) He has an occluded left internal  carotid artery and 60-79% stenosis by prior carotid Doppler.  Continue aspirin, Plavix, statin.  He has an appointment pending with vascular surgery.  COVID-19 Education: The signs and symptoms of COVID-19 were discussed with the patient and how to seek care for testing (follow up with PCP or arrange E-visit).  ***The importance of social distancing was discussed today.  Time:   Today, I have spent *** minutes with the patient with telehealth technology discussing the above problems.     Medication Adjustments/Labs and Tests Ordered: Current medicines are reviewed at length with  the patient today.  Concerns regarding medicines are outlined above.   Tests Ordered: No orders of the defined types were placed in this encounter.   Medication Changes: No orders of the defined types were placed in this encounter.   Disposition:  Follow up {follow up:15908}  Signed, Richardson Dopp, PA-C  04/08/2019 1:06 PM    [facility] This encounter was created in error - please disregard.

## 2019-04-09 ENCOUNTER — Telehealth: Payer: Self-pay | Admitting: Physician Assistant

## 2019-04-09 NOTE — Telephone Encounter (Signed)
New Message     Pts sister is calling because she says she didn't know the pt had an appt. She says the pt has had a stroke and has a bad memory. She also says she doesn't think a virtual visit would work the the pt       Please call back

## 2019-04-09 NOTE — Progress Notes (Signed)
Erroneous encounter

## 2019-06-09 ENCOUNTER — Other Ambulatory Visit: Payer: Self-pay

## 2019-06-09 ENCOUNTER — Encounter: Payer: Self-pay | Admitting: Neurology

## 2019-06-09 ENCOUNTER — Ambulatory Visit (INDEPENDENT_AMBULATORY_CARE_PROVIDER_SITE_OTHER): Payer: Medicare Other | Admitting: Neurology

## 2019-06-09 VITALS — BP 142/67 | HR 70 | Temp 97.5°F | Wt 128.0 lb

## 2019-06-09 DIAGNOSIS — I771 Stricture of artery: Secondary | ICD-10-CM | POA: Diagnosis not present

## 2019-06-09 NOTE — Progress Notes (Signed)
[facility] [address] [place]. [place] [address] [phone]       OFFICE FOLLOW-UP NOTE  [name] Carney Date of Birth:  02-21-1961 Medical Record Number:  [id number]   HPI: Mr Raymond Carney Carney is seen for first office f/u visit after Raymond Carney Carney admission for stroke in July 2016. Raymond Carney Carney is an 58 y.o. 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 Presented on 05/23/15 with stuttering symptoms of speech difficulty and right sided weakness. Speech deficits described as dysarthria and an apparent expressive aphasia. Acute onset of symptoms around 1800, resolved around 1hr later and then returned around 1935, this time lasting around 15 minutes before mostly resolving again. (dysarthria remains). Reported taking ASA and Plavix at home. CT scan of the head on admission showed no acute abnormality but an old left occipital lobe infarct. Patient previously had had catheter range exam performed on 01/08/15 by Dr. Laurence Carney which has shown occluded left common carotid, left subclavian artery with high-grade 95% stenosis of the right subclavian at its origin and 95% stenosis of the right vertebral at its origin with retrograde and antegrade reconstitution of the left MCA via anterior communicating artery collaterals. TPA was considered but not given because patient was considered too mild to treat with NIH of only 1 for dysarthria. MRI scan of the brain showed acute ischemic nonhemorrhagic left anterior temporal cortical infarct. Cerebral catheter angiogram 05/24/15 by Dr. Estanislado Carney showed worsening of the right subclavian artery stenosis and right vertebral ostial stenosis with chronic left carotid and subclavian occlusion. Patient underwent elective right subclavian angioplasty proximal to the right vertebral artery origin which less to modest improvement in the caliber of the right vertebral  artery which did not need additional angioplasty. Patient was started on aspirin and Plavix and his speech improved. His minimum weakness in the right grip and hip flexors which improved as well. He states his done well since discharge. He is tolerating aspirin and Plavix without bleeding or bruising. He is also on Lipitor and tolerating it without myalgias or arthralgias. He states his blood pressure is good. Today it is 100/70. He quit smoking in spring of 2016. He has not yet seen Dr. the mesh were for follow-up. Patient wants to have a colonoscopy done because he was having some blood in his stools even prior to his stroke. Update 01/25/16 : He returns for follow-up after last visit 6 months ago. Continue to do well without recurrent stroke or TIA symptoms. He is still living with his dad but his sister is accompanying him today. Patient has not had done colonoscopy as he wanted to wait due to stroke risk. He had follow-up carotid ultrasound done on 11/22/15 which I have reviewed shows chronically occluded left common carotid, left vertebral and subclavian arteries. Right internal carotid artery shows elevated velocities in the 60-79% range and right subclavian greater than 50% stenosis. Patient remains on aspirin and Plavix tolerating it well with only minor bruising and no bleeding episodes. Patient does have a family history of colon cancer and his colonoscopy is overdue and he has questions about stroke risk while holding antiplatelet therapy for the procedure. [name] Update 08/09/2016 ; he returns for follow-up after last visit 6 months ago. He continues to do well without recurrent stroke or TIA symptoms. He remains on aspirin and Plavix which is tolerating well without bruising or bleeding. He had a follow-up carotid  ultrasound done in the vascular surgery lab on 05/22/16 which showed persistent chronic left ICA occlusion with 50-69% right ICA and greater than 50% right subclavian stenosis. Patient denies any  symptoms of subclavian steal lower TIA or stroke. States her blood pressure is well controlled. He is tolerating his cholesterol medication without muscle aches or pains. Her is not sure when he had his last lipid profile checked. Update 06/09/2019 : He returns for follow-up today after his last visit nearly 3 years ago.  He is accompanied by his sister.  Patient was lost to follow-up after last visit in October 2017.  He was living with his parents and after his mother died his sister who is accompanying him today for this visit is not helping him with medical follow-up.  Patient has not had any recurrent stroke or TIA symptoms since his stroke in August 2016.  However he had once quit smoking but has restarted smoking and now smokes 2packs/week.  He is on Plavix and tolerating it well with only minor bruising.  He was supposed to be on aspirin also but is not taking it.  He had lipid profile checked in 11/04/2018 which showed total cholesterol 301 and LDL 241 mg percent.  He has since been started on Zetia and Crestor 20 mg which he seems to be tolerating well.  States his blood pressure is usually well controlled though today it is borderline at 142/67 in office.  He had follow-up lipid profile checked a few months ago after starting his medications and apparently it was satisfactory but I do not have those results.  He has no new neurological complaints.  He had follow-up carotid ultrasound done in Dr. Luther Parody office on 05/07/2018 which had shown 40 to 59% right ICA and some right subclavian stenosis as well.  He was advised to have follow-up studies but he has not had them for more than a year ROS:   14 system review of systems is positive for slight memory difficulties, bruising and all other systems negative   no  complaints today   PMH:  Past Medical History:  Diagnosis Date   Carotid stenosis    [place] 7824:  R 23-53; LICA occluded; prox L subcl occluded; R subcl > 50%   COPD (chronic obstructive  pulmonary disease) ([place])    History of stroke    Echo 7/16:  EF 60-65   Hyperlipidemia    Pituitary tumor    s/p excision ~ 2000   Stenosis of right subclavian artery (HCC)    S/P STENT FOLLOWED BY DR. Leonie Man AND DR. Estanislado Carney    TIA (transient ischemic attack)     Social History:  Social History   Socioeconomic History   Marital status: Divorced    Spouse name: Not on file   Number of children: 1   Years of education: Not on file   Highest education level: Not on file  Occupational History   Occupation: Disabled  Scientist, product/process development strain: Not on file   Food insecurity    Worry: Not on file    Inability: Not on file   Transportation needs    Medical: Not on file    Non-medical: Not on file  Tobacco Use   Smoking status: Former Smoker    Packs/day: 0.50    Quit date: 01/22/2014    Years since quitting: 5.3   Smokeless tobacco: Never Used  Substance and Sexual Activity   Alcohol use: No   Drug use:  Never   Sexual activity: Not on file  Lifestyle   Physical activity    Days per week: Not on file    Minutes per session: Not on file   Stress: Not on file  Relationships   Social connections    Talks on phone: Not on file    Gets together: Not on file    Attends religious service: Not on file    Active member of club or organization: Not on file    Attends meetings of clubs or organizations: Not on file    Relationship status: Not on file   Intimate partner violence    Fear of current or ex partner: Not on file    Emotionally abused: Not on file    Physically abused: Not on file    Forced sexual activity: Not on file  Other Topics Concern   Not on file  Social History Narrative   Not on file    Medications:   Current Outpatient Medications on File Prior to Visit  Medication Sig Dispense Refill   rosuvastatin (CRESTOR) 20 MG tablet Take 20 mg by mouth daily.     aspirin EC 81 MG tablet Take 1 tablet (81 mg total) by  mouth daily. 90 tablet 3   clopidogrel (PLAVIX) 75 MG tablet Take 75 mg by mouth daily.  2   ezetimibe (ZETIA) 10 MG tablet      oxyCODONE-acetaminophen (PERCOCET/ROXICET) 5-325 MG tablet 1 tablet every 6 (six) hours as needed.      No current facility-administered medications on file prior to visit.     Allergies:  No Known Allergies  Physical Exam General: Frail malnourished looking middle-aged Caucasian male seated, in no evident distress Head: head normocephalic and atraumatic.  Neck: supple with soft right carotid , subclavian and right vertebral and basilar bruits. Cardiovascular: regular rate and rhythm, no murmurs Musculoskeletal: no deformity Skin:  no rash/petichiae Vascular:  Normal pulses all extremities. Right ocular, subclavian and carotid bruit heard. No pulses felt in left upper extremity. And febrile in right upper extremity. Vitals:   06/09/19 1114  BP: (!) 142/67  Pulse: 70  Temp: (!) 97.5 F (36.4 C)   Neurologic Exam Mental Status: Awake and fully alert. Oriented to place and time. Recent and remote memory intact. Attention span, concentration and fund of knowledge appropriate. Mood and affect appropriate.  Cranial Nerves: Fundoscopic exam not done  . Pupils equal, briskly reactive to light. Extraocular movements full without nystagmus. Visual fields full to confrontation. Hearing intact. Facial sensation intact. Face, tongue, palate moves normally and symmetrically.  Motor: Normal bulk and tone. Normal strength in all tested extremity muscles. Sensory.: intact to touch ,pinprick .position and vibratory sensation.  Coordination: Rapid alternating movements normal in all extremities. Finger-to-nose and heel-to-shin performed accurately bilaterally. Gait and Station: Arises from chair without difficulty. Stance is normal. Gait demonstrates normal stride length and balance . Unable to heel, toe and tandem walk without difficulty.  Reflexes: 1+ and symmetric. Toes  downgoing.      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 Patient underwent successful right subclavian angioplasty on 05/26/2015.  He has done well without recurrent neurovascular symptoms but continues to smoke.    PLAN: I had a long discussion with the patient and his sister regarding his remote stroke and subclavian and carotid stenosis  and answered questions.  I recommend he take aspirin 81 mg daily along with Plavix for stroke prevention and maintain aggressive risk factor modification.  I strongly encouraged him to quit smoking completely and to seek help from his primary physician for the same.  Maintain strict control of hypertension with blood pressure goal below 130/90, lipids with LDL cholesterol goal below 70 mg percent.  I encouraged him to eat a healthy diet with lots of fruits, vegetables, cereals and whole grains and to be active.  Check follow-up surveillance carotid ultrasound and transcranial Doppler studies.  He will return for follow-up in 6 months with my nurse practitioner Janett Billow or call earlier if necessary.  Greater than 50% time during this 25-minute visit was spent in counseling and coordination of care about his subclavian stenosis and discussion about stroke prevention and treatment and answering questions.  Antony Contras, MD  Note: This document was prepared with digital dictation and possible smart phrase technology. Any transcriptional errors that result from this process are unintentional

## 2019-06-09 NOTE — Patient Instructions (Signed)
I had a long discussion with the patient and his sister regarding his remote stroke and subclavian and carotid stenosis and answered questions.  I recommend he take aspirin 81 mg daily along with Plavix for stroke prevention and maintain aggressive risk factor modification.  I strongly encouraged him to quit smoking completely and to seek help from his primary physician for the same.  Maintain strict control of hypertension with blood pressure goal below 130/90, lipids with LDL cholesterol goal below 70 mg percent.  I encouraged him to eat a healthy diet with lots of fruits, vegetables, cereals and whole grains and to be active.  Check follow-up surveillance carotid ultrasound and transcranial Doppler studies.  He will return for follow-up in 6 months with my nurse practitioner Janett Billow or call earlier if necessary.

## 2019-06-16 ENCOUNTER — Ambulatory Visit (HOSPITAL_BASED_OUTPATIENT_CLINIC_OR_DEPARTMENT_OTHER)
Admission: RE | Admit: 2019-06-16 | Discharge: 2019-06-16 | Disposition: A | Discharge status: Home / Self Care | Payer: Medicare Other | Source: Ambulatory Visit | Attending: Neurology | Admitting: Neurology

## 2019-06-16 ENCOUNTER — Other Ambulatory Visit: Payer: Self-pay

## 2019-06-16 ENCOUNTER — Ambulatory Visit (HOSPITAL_COMMUNITY)
Admission: RE | Admit: 2019-06-16 | Discharge: 2019-06-16 | Disposition: A | Discharge status: Home / Self Care | Payer: Medicare Other | Source: Ambulatory Visit | Attending: Neurology | Admitting: Neurology

## 2019-06-16 DIAGNOSIS — I771 Stricture of artery: Secondary | ICD-10-CM | POA: Insufficient documentation

## 2019-06-17 ENCOUNTER — Other Ambulatory Visit (HOSPITAL_COMMUNITY): Payer: Self-pay | Admitting: Neurology

## 2019-06-17 ENCOUNTER — Telehealth: Payer: Self-pay

## 2019-06-17 DIAGNOSIS — I63232 Cerebral infarction due to unspecified occlusion or stenosis of left carotid arteries: Secondary | ICD-10-CM

## 2019-06-17 NOTE — Telephone Encounter (Signed)
I called pt about vas carotid ultrasound results. I stated the carotid ultrasound study shows persistent moderate narrowing at the right carotid bifurcation and chronically occluded left carotid Artery.No significant change compared with carotid ultrasound from year ago. PT verbalized understanding.  ------

## 2019-06-17 NOTE — Telephone Encounter (Signed)
-----   Message from Garvin Fila, MD sent at 06/16/2019  5:01 PM EDT ----- Raymond Carney inform the patient that carotid ultrasound study shows persistent moderate narrowing at the right carotid bifurcation and chronically occluded left carotid artery.  No significant change compared with carotid ultrasound from 1 year ago

## 2019-06-25 ENCOUNTER — Ambulatory Visit
Admission: RE | Admit: 2019-06-25 | Discharge: 2019-06-25 | Disposition: A | Discharge status: Home / Self Care | Payer: Medicare Other | Source: Ambulatory Visit | Attending: Neurology | Admitting: Neurology

## 2019-06-25 ENCOUNTER — Other Ambulatory Visit: Payer: Self-pay

## 2019-06-25 DIAGNOSIS — I63232 Cerebral infarction due to unspecified occlusion or stenosis of left carotid arteries: Secondary | ICD-10-CM

## 2019-06-25 MED ORDER — IOPAMIDOL (ISOVUE-370) INJECTION 76%
75.0000 mL | Freq: Once | INTRAVENOUS | Status: AC | PRN
  Administered 2019-06-25: 75 mL via INTRAVENOUS

## 2019-06-26 ENCOUNTER — Other Ambulatory Visit (HOSPITAL_COMMUNITY): Payer: Self-pay | Admitting: Interventional Radiology

## 2019-06-26 ENCOUNTER — Telehealth: Payer: Self-pay | Admitting: Neurology

## 2019-06-26 DIAGNOSIS — I771 Stricture of artery: Secondary | ICD-10-CM

## 2019-06-26 NOTE — Telephone Encounter (Signed)
I called patients sister Santiago Glad about pts results. I stated only the pt was call about hs carotid results. I explain no one talk to his other sister because she is not on the dpr. I stated we only talk to people who are listed on the dpr about personal health information. She stated her father who answer the phone might of got confuse on who I spoke to. We decided to change the number to her number for all appts or results. I gave her the vas carotid results and she verbalized understanding.

## 2019-06-26 NOTE — Telephone Encounter (Signed)
Pt's sister Vallery Ridge on DPR would like RN to call her to go over results with her because she states that her other sister was spoken to not her. Please advise.

## 2019-06-30 NOTE — Telephone Encounter (Signed)
I called and left a message on patient's Sister Karen's answering machine to call me back to discuss CT angios results and next steps

## 2019-06-30 NOTE — Telephone Encounter (Signed)
I spoke to the patient's sister Santiago Glad and answered her questions.  Patient is scheduled for diagnostic cerebral catheter angiogram by Dr. [name] on 07/03/2019.  Patient was advised to continue his current medications aspirin and Plavix for the procedure.  This is likely going to be a diagnostic procedure and if patient has a endovascularly treatable lesion found patient may have to return for the treatment.  She voiced understanding.  I also discussed the case with Dr.Deveshwar   who agreed with the plan

## 2019-06-30 NOTE — Telephone Encounter (Signed)
Pt sister Santiago Glad is returning the call to Dr Leonie Man, she is asking if and when pt should stop medications before his procedure on 09-03

## 2019-07-02 ENCOUNTER — Other Ambulatory Visit: Payer: Self-pay | Admitting: Radiology

## 2019-07-02 ENCOUNTER — Other Ambulatory Visit: Payer: Self-pay | Admitting: Student

## 2019-07-03 ENCOUNTER — Other Ambulatory Visit (HOSPITAL_COMMUNITY): Payer: Self-pay | Admitting: Interventional Radiology

## 2019-07-03 ENCOUNTER — Other Ambulatory Visit: Payer: Self-pay

## 2019-07-03 ENCOUNTER — Ambulatory Visit (HOSPITAL_COMMUNITY): Payer: Medicare Other

## 2019-07-03 ENCOUNTER — Ambulatory Visit (HOSPITAL_COMMUNITY)
Admission: RE | Admit: 2019-07-03 | Discharge: 2019-07-03 | Disposition: A | Discharge status: Home / Self Care | Payer: Medicare Other | Source: Ambulatory Visit | Attending: Interventional Radiology | Admitting: Interventional Radiology

## 2019-07-03 DIAGNOSIS — Z96643 Presence of artificial hip joint, bilateral: Secondary | ICD-10-CM | POA: Insufficient documentation

## 2019-07-03 DIAGNOSIS — I771 Stricture of artery: Secondary | ICD-10-CM | POA: Diagnosis not present

## 2019-07-03 DIAGNOSIS — Z87891 Personal history of nicotine dependence: Secondary | ICD-10-CM | POA: Insufficient documentation

## 2019-07-03 DIAGNOSIS — I6521 Occlusion and stenosis of right carotid artery: Secondary | ICD-10-CM | POA: Insufficient documentation

## 2019-07-03 DIAGNOSIS — E785 Hyperlipidemia, unspecified: Secondary | ICD-10-CM | POA: Diagnosis not present

## 2019-07-03 DIAGNOSIS — Z7902 Long term (current) use of antithrombotics/antiplatelets: Secondary | ICD-10-CM | POA: Diagnosis not present

## 2019-07-03 DIAGNOSIS — J449 Chronic obstructive pulmonary disease, unspecified: Secondary | ICD-10-CM | POA: Insufficient documentation

## 2019-07-03 DIAGNOSIS — Z8249 Family history of ischemic heart disease and other diseases of the circulatory system: Secondary | ICD-10-CM | POA: Insufficient documentation

## 2019-07-03 DIAGNOSIS — Z7982 Long term (current) use of aspirin: Secondary | ICD-10-CM | POA: Diagnosis not present

## 2019-07-03 DIAGNOSIS — Z823 Family history of stroke: Secondary | ICD-10-CM | POA: Diagnosis not present

## 2019-07-03 DIAGNOSIS — Z8673 Personal history of transient ischemic attack (TIA), and cerebral infarction without residual deficits: Secondary | ICD-10-CM | POA: Insufficient documentation

## 2019-07-03 DIAGNOSIS — Z79899 Other long term (current) drug therapy: Secondary | ICD-10-CM | POA: Diagnosis not present

## 2019-07-03 HISTORY — PX: IR ANGIO INTRA EXTRACRAN SEL COM CAROTID INNOMINATE UNI R MOD SED: IMG5359

## 2019-07-03 HISTORY — PX: IR ANGIOGRAM EXTREMITY RIGHT: IMG652

## 2019-07-03 LAB — BASIC METABOLIC PANEL
Anion gap: 10 (ref 5–15)
BUN: 6 mg/dL (ref 6–20)
CO2: 26 mmol/L (ref 22–32)
Calcium: 9.1 mg/dL (ref 8.9–10.3)
Chloride: 96 mmol/L — ABNORMAL LOW (ref 98–111)
Creatinine, Ser: 1.12 mg/dL (ref 0.61–1.24)
GFR calc Af Amer: >60 mL/min (ref >60)
GFR calc non Af Amer: >60 mL/min (ref >60)
Glucose, Bld: 79 mg/dL (ref 70–99)
Potassium: 4 mmol/L (ref 3.5–5.1)
Sodium: 132 mmol/L — ABNORMAL LOW (ref 135–145)

## 2019-07-03 LAB — CBC WITH DIFFERENTIAL/PLATELET
Abs Immature Granulocytes: 0.04 10*3/uL (ref 0.00–0.07)
Basophils Absolute: 0.1 10*3/uL (ref 0.0–0.1)
Basophils Relative: 0 %
Eosinophils Absolute: 0.2 10*3/uL (ref 0.0–0.5)
Eosinophils Relative: 1 %
HCT: 39.2 % (ref 39.0–52.0)
Hemoglobin: 12.9 g/dL — ABNORMAL LOW (ref 13.0–17.0)
Immature Granulocytes: 0 %
Lymphocytes Relative: 15 %
Lymphs Abs: 1.9 10*3/uL (ref 0.7–4.0)
MCH: 29.1 pg (ref 26.0–34.0)
MCHC: 32.9 g/dL (ref 30.0–36.0)
MCV: 88.3 fL (ref 80.0–100.0)
Monocytes Absolute: 1.4 10*3/uL — ABNORMAL HIGH (ref 0.1–1.0)
Monocytes Relative: 11 %
Neutro Abs: 8.9 10*3/uL — ABNORMAL HIGH (ref 1.7–7.7)
Neutrophils Relative %: 73 %
Platelets: 230 10*3/uL (ref 150–400)
RBC: 4.44 MIL/uL (ref 4.22–5.81)
RDW: 13.6 % (ref 11.5–15.5)
WBC: 12.4 10*3/uL — ABNORMAL HIGH (ref 4.0–10.5)
nRBC: 0 % (ref 0.0–0.2)

## 2019-07-03 LAB — PROTIME-INR
INR: 1 (ref 0.8–1.2)
Prothrombin Time: 13.5 seconds (ref 11.4–15.2)

## 2019-07-03 MED ORDER — MIDAZOLAM HCL 2 MG/2ML IJ SOLN
INTRAMUSCULAR | Status: AC
  Filled 2019-07-03: qty 2

## 2019-07-03 MED ORDER — LIDOCAINE HCL (PF) 1 % IJ SOLN
INTRAMUSCULAR | Status: AC | PRN
  Administered 2019-07-03: 5 mL

## 2019-07-03 MED ORDER — LIDOCAINE HCL 1 % IJ SOLN
INTRAMUSCULAR | Status: AC
  Filled 2019-07-03: qty 20

## 2019-07-03 MED ORDER — MIDAZOLAM HCL 2 MG/2ML IJ SOLN
INTRAMUSCULAR | Status: AC | PRN
  Administered 2019-07-03: 1 mg via INTRAVENOUS

## 2019-07-03 MED ORDER — SODIUM CHLORIDE 0.9 % IV SOLN
INTRAVENOUS | Status: DC
  Administered 2019-07-03: 09:00:00 via INTRAVENOUS

## 2019-07-03 MED ORDER — IOHEXOL 300 MG/ML  SOLN
150.0000 mL | Freq: Once | INTRAMUSCULAR | Status: AC | PRN
  Administered 2019-07-03: 65 mL via INTRA_ARTERIAL

## 2019-07-03 MED ORDER — FENTANYL CITRATE (PF) 100 MCG/2ML IJ SOLN
INTRAMUSCULAR | Status: AC
  Filled 2019-07-03: qty 2

## 2019-07-03 MED ORDER — HEPARIN SODIUM (PORCINE) 1000 UNIT/ML IJ SOLN
INTRAMUSCULAR | Status: AC
  Filled 2019-07-03: qty 1

## 2019-07-03 MED ORDER — SODIUM CHLORIDE 0.9 % IV SOLN: INTRAVENOUS | Status: AC

## 2019-07-03 MED ORDER — FENTANYL CITRATE (PF) 100 MCG/2ML IJ SOLN
INTRAMUSCULAR | Status: AC | PRN
  Administered 2019-07-03: 25 ug via INTRAVENOUS

## 2019-07-03 NOTE — Sedation Documentation (Signed)
Sheath pulled at 1055. IR tech holding pressure to right groin.

## 2019-07-03 NOTE — Procedures (Signed)
S/P RT CCA abd RT subclavian artreriograms RT CFA approach. Findings. 1.80 % stenosis of RTT SCA prox. 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

## 2019-07-03 NOTE — H&P (Signed)
Chief Complaint: Patient was seen in consultation today for cerebral angiogram  Referring Physician(s): Deveshwar,Sanjeev  Supervising Physician: Luanne Bras  Patient Status: [facility] - Out-pt  History of Present Illness: Raymond Carney is a 58 y.o. male with a past medical history significant for pituitary tumor s/p excision, COPD, HLD, CVA, carotid stenosis and right subclavian artery stenosis s/p stenting who presents today a diagnostic cerebral angiogram. Patient previously seen in [place] on 01/08/2015 where he underwent a diagnostic cerebral angiogram for dizziness, left sided blurry vision and diplopia x 5 weeks - angiogram noted to have an occluded left subclavian and left common carotid arteries proximally, approximately 95%+ stenosis of the right subclavian artery proximally, 90% stenosis of the right VA origin and left subclavian steal. He was recommended to begin ASA 325 mg QD and return for cerebral intervention however patient and family requested to discuss possibly procedure further prior to scheduling. Unfortunately he presented to the ED on 05/23/15 as CODE STROKE - a repeat diagnostic angiogram was performed on 05/24/15 which showed interval worsening of the right subclavian artery stenoses and right vertebral artery stenoses with extensive collaterals reconstituting the left vertebral and left internal carotid artery. Intervention in NIR was again discussed with patient and family to which they agreed and patient subsequently underwent balloon angioplasty of the right focal stenosis in the proximal right subclavian artery with residual stenoses of 50% on 05/28/15. He was discharged on 05/27/15 on Plavix 75 mg  QD + ASA 325 mg QD.  He was then seen in follow up with Dr. [name] on 06/14/15 where he reported improvement in his symptoms. It was recommended that he continue Plavix + ASA, stop smoking and increase his daily activity. He was scheduled to a neurologist and undergo further  imaging studies. He was followed by Dr. Leonie Man with most recent visit 06/09/19 - per chart he was lost to follow up after October 2017. He was advised at this visit to again quit smoking, continue Plavix + ASA and undergo follow up surveillance carotid US and transcranial doppler studies with plans to return in 6 months. He underwent carotid and transcranial [place] on 06/16/19 which showed right ICA, ECA, subclavian stenosis and left occluded CCA and ICA. Request was then made to [facility] for a diagnostic cerebral angiogram for which he presents today.  Patient presents with his sister today, he denies any complaints currently. He states he has continued to take his medications as prescribed and is trying to quit smoking again. He states understanding of the procedure and wishes to proceed.   Past Medical History:  Diagnosis Date   Carotid stenosis    [place] 0000000:  R [id number]; LICA occluded; prox L subcl occluded; R subcl > 50%   COPD (chronic obstructive pulmonary disease) (HCC)    History of stroke    Echo 7/16:  EF 60-65   Hyperlipidemia    Pituitary tumor    s/p excision ~ 2000   Stenosis of right subclavian artery ([place])    S/P STENT FOLLOWED BY DR. Leonie Man AND DR. [name]    TIA (transient ischemic attack)     Past Surgical History:  Procedure Laterality Date   bilateral hip replacements     HIP ARTHROPLASTY Bilateral    ON DISABILITY SECONDARY    OTHER SURGICAL HISTORY  1996   HISTORY OF PITUITARY TUMOR REMOVAL CIRCA   RADIOLOGY WITH ANESTHESIA N/A 05/26/2015   Procedure: RADIOLOGY WITH ANESTHESIA/ANGIOPLASTY;  Surgeon: Luanne Bras, MD;  Location: [place];  Service: Radiology;  Laterality: N/A;   TRANSPHENOIDAL / TRANSNASAL HYPOPHYSECTOMY / RESECTION PITUITARY TUMOR      Allergies: Patient has no known allergies.  Medications: Prior to Admission medications   Medication Sig Start Date End Date Taking? Authorizing Provider  aspirin EC 81 MG tablet Take 1 tablet (81 mg total) by  mouth daily. 04/02/18  Yes Weaver, Scott T, PA-C  clopidogrel (PLAVIX) 75 MG tablet Take 75 mg by mouth daily. 05/04/15  Yes [provider]  ezetimibe (ZETIA) 10 MG tablet Take 10 mg by mouth daily.  08/03/16  Yes [provider]  rosuvastatin (CRESTOR) 20 MG tablet Take 20 mg by mouth daily.   Yes [provider]     Family History  Problem Relation Age of Onset   Hypertension Mother    CAD Mother    Congestive Heart Failure Mother    Atrial fibrillation Mother    Stroke Father    Hypertension Father    CVA Father    CAD Father        s/p CABG   Prostate cancer Father    Colon cancer Sister 98   Colon polyps Neg Hx    Liver cancer Neg Hx     Social History   Socioeconomic History   Marital status: Divorced    Spouse name: Not on file   Number of children: 1   Years of education: Not on file   Highest education level: Not on file  Occupational History   Occupation: Disabled  Scientist, product/process development strain: Not on file   Food insecurity    Worry: Not on file    Inability: Not on file   Transportation needs    Medical: Not on file    Non-medical: Not on file  Tobacco Use   Smoking status: Former Smoker    Packs/day: 0.50    Quit date: 01/22/2014    Years since quitting: 5.4   Smokeless tobacco: Never Used  Substance and Sexual Activity   Alcohol use: No   Drug use: Never   Sexual activity: Not on file  Lifestyle   Physical activity    Days per week: Not on file    Minutes per session: Not on file   Stress: Not on file  Relationships   Social connections    Talks on phone: Not on file    Gets together: Not on file    Attends religious service: Not on file    Active member of club or organization: Not on file    Attends meetings of clubs or organizations: Not on file    Relationship status: Not on file  Other Topics Concern   Not on file  Social History Narrative   Not on file     Review  of Systems: A 12 point ROS discussed and pertinent positives are indicated in the HPI above.  All other systems are negative.  Review of Systems  Constitutional: Negative for appetite change, chills, fatigue and fever.  HENT: Negative for nosebleeds.        (-) nosebleeds  Respiratory: Negative for cough and shortness of breath.   Cardiovascular: Negative for chest pain.  Gastrointestinal: Negative for abdominal pain, blood in stool, diarrhea, nausea and vomiting.  Genitourinary: Negative for hematuria.  Skin: Negative for rash and wound.  Neurological: Negative for dizziness, seizures, syncope, facial asymmetry, speech difficulty, weakness, light-headedness, numbness and headaches.  Hematological: Does not bruise/bleed easily.    Vital Signs: BP  108/60    Pulse (!) 49    Temp 97.9 F (36.6 C) (Oral)    Ht 5\' 8"  (1.727 m)    Wt 135 lb (61.2 kg)    SpO2 100%    BMI 20.53 kg/m   Physical Exam Vitals signs reviewed.  Constitutional:      General: He is not in acute distress. HENT:     Mouth/Throat:     Mouth: Mucous membranes are moist.     Pharynx: Oropharynx is clear. No oropharyngeal exudate or posterior oropharyngeal erythema.  Cardiovascular:     Rate and Rhythm: Regular rhythm. Bradycardia present.     Comments: Palpable distal pulses - left 2+, right 1+ Pulmonary:     Effort: Pulmonary effort is normal.     Breath sounds: Normal breath sounds.  Abdominal:     General: Bowel sounds are normal. There is no distension.     Palpations: Abdomen is soft.     Tenderness: There is no abdominal tenderness.  Musculoskeletal:     Right lower leg: No edema.     Left lower leg: No edema.  Skin:    General: Skin is warm and dry.  Neurological:     Mental Status: He is alert.  Psychiatric:        Mood and Affect: Mood normal.   Alert, awake, and oriented x4 Speech and comprehension in tact PERRL bilaterally EOMs without nystagmus or subjective diplopia. Visual fields not  assessed No facial asymmetry noted. Tongue midline - not assessed Motor power full bilaterally Not assessed - pronator drift. Fine motor and coordination in tact Gait normal Romberg not assessed Heel to toe not assessed Distal pulses palpable bilaterally    MD Evaluation Airway: WNL Heart: WNL Abdomen: WNL Chest/ Lungs: WNL ASA  Classification: 3 Mallampati/Airway Score: Two   Imaging: Ct Angio Head W Or Wo Contrast  Result Date: 06/25/2019 CLINICAL DATA:  58 year old male with history of left subclavian artery and left common carotid artery occlusions. Collaterals reconstituting the left vertebral and left internal carotid arteries. Superimposed severe right subclavian and right vertebral artery stenoses. Status post 2016 balloon angioplasty of the right side stenoses. EXAM: CT ANGIOGRAPHY HEAD AND NECK TECHNIQUE: Multidetector CT imaging of the head and neck was performed using the standard protocol during bolus administration of intravenous contrast. Multiplanar CT image reconstructions and MIPs were obtained to evaluate the vascular anatomy. Carotid stenosis measurements (when applicable) are obtained utilizing NASCET criteria, using the distal internal carotid diameter as the denominator. CONTRAST:  25mL ISOVUE-370 IOPAMIDOL (ISOVUE-370) INJECTION 76% COMPARISON:  Four-vessel angiograms in 2016. Brain MRI 05/23/2015. Brain MRI and intracranial MRA 02/23/2015. FINDINGS: CT HEAD Brain: Stable cerebral volume since 2016. Chronic encephalomalacia in the left hemisphere affecting the temporal and occipital lobes. Chronic lacunar infarct of the left corona radiata better demonstrated on prior MRI. A small chronic infarct in the left cerebellum is also stable (series 6, image 9). No midline shift, ventriculomegaly, mass effect, evidence of mass lesion, intracranial hemorrhage or evidence of cortically based acute infarction. Calvarium and skull base: No acute osseous abnormality identified.  Paranasal sinuses: Visualized paranasal sinuses and mastoids are clear. Orbits: Visualized orbits and scalp soft tissues are within normal limits. CTA NECK Skeleton: Poor dentition. Cervical spine degeneration. No acute osseous abnormality identified. Upper chest: Centrilobular emphysema. No superior mediastinal lymphadenopathy. Other neck: No acute findings. A granular appearance of the parotid gland suggests some prior salivary gland inflammation. Aortic arch: Moderate soft and calcified arch atherosclerosis. Three  vessel arch configuration. Right carotid system: No brachiocephalic artery stenosis despite soft and calcified plaque. No right CCA origin stenosis. Moderate circumferential soft plaque in the proximal right CCA at the level of the thyroid without significant stenosis (series 5, image 146). Continued plaque to the right carotid bifurcation. Superimposed calcified plaque at the right ICA origin and bulb resulting in 72 % stenosis with respect to the distal vessel (series 5, image 110). There is 50% stenosis at the distal bulb downstream (image 105). The right ICA remains patent to the skull base. Left carotid system: Occluded left common carotid artery at its origin. At the left carotid bifurcation there is reconstituted enhancement of the left external carotid artery, but the left ICA origin is occluded. No reconstituted enhancement of the left ICA to the skull base. Vertebral arteries: High-grade radiographic string sign stenosis of the right subclavian artery origin (series 5, image 155 and series 11, images 76 and 77. This is numerically estimated at 90 % with respect to the distal vessel. The right subclavian remains patent. There is superimposed moderate to severe stenosis of the right subclavian artery origin seen on series 11, image 88 and series 13, image 58. The right vertebral is patent and mildly tortuous to the skull base without additional stenosis. Occluded left subclavian artery at its  origin. There is a diminutive enhancing left Vertebral artery proximal V2 segment which appears reconstituted from muscular branches. The mid and distal V2 segment has a slightly larger, more normal caliber, with multiple muscular branch collaterals noted. The left vertebral maintains this caliber and is patent to the skull base without additional stenosis. CTA HEAD Posterior circulation: Dominant appearing distal left vertebral artery, the right appears to functionally terminates in PICA. The left PICA origin is patent. The vertebrobasilar junction and basilar arteries are patent although diminutive. AICA and SCA origins are patent. There are fetal type PCA origins, and that on the right may reconstitute some of the distal basilar. Prominent caliber right posterior communicating artery. More normal caliber left PCOM. The left PCA branches are within normal limits. There is moderate irregularity of the distal right PCA branches. Anterior circulation: The left ICA siphon is occluded until reconstituted at the level of the left ophthalmic artery origin (series 5, image 52). The left posterior communicating artery is also patent near this level. The left ICA terminus is patent but diminutive. The left ACA A1 segment is diminutive or absent. The left MCA M1, left MCA bifurcation, and left MCA branches are patent but diminutive. The right ICA siphon is patent and mildly hyperplastic with moderate calcified plaque but no significant stenosis. Normal right ophthalmic and posterior communicating artery origins. Normal right ICA terminus, right MCA and ACA origins. The right A1 is dominant. The anterior communicating artery is present. Bilateral ACA branches are within normal limits. Right MCA M1 and bifurcation are patent without stenosis. Right MCA branches are within normal limits and slightly larger than those on the left. Venous sinuses: Patent. Anatomic variants: Fetal type PCA origins. Mildly dominant distal left  vertebral artery. Diminutive or absent left ACA A1 segment. Review of the MIP images confirms the above findings IMPRESSION: 1. Chronically occluded Left CCA, cervical Left ICA, proximal Left subclavian, and proximal Left vertebral arteries. - Left ICA is reconstituted distally from the left ophthalmic and posterior communicating arteries. - Left vertebral artery is reconstituted from muscular collaterals. - Left MCA is patent but diminutive. - Left ACA is supplied from the right side. 2. Bulky atherosclerosis of  the contralateral cervical Right Carotid Artery with significant stenosis of the proximal Right ICA estimated at 72% (series 5, image 110). 3. Positive for High-grade (Radiographic-String-Sign) stenosis of the Right Subclavian Artery origin. 4. Moderate to Severe stenosis of the Right Vertebral Artery origin. The right vertebral appears to functionally terminates in PICA. 5. CT appearance of the brain is stable from the 2016 MRI; chronic left hemisphere ischemia. 6. Aortic Atherosclerosis (ICD10-I70.0) and Emphysema (ICD10-J43.9). Electronically Signed   By: Genevie Ann M.D.   On: 06/25/2019 13:07   Ct Angio Neck W/cm &/or Wo/cm  Result Date: 06/25/2019 CLINICAL DATA:  58 year old male with history of left subclavian artery and left common carotid artery occlusions. Collaterals reconstituting the left vertebral and left internal carotid arteries. Superimposed severe right subclavian and right vertebral artery stenoses. Status post 2016 balloon angioplasty of the right side stenoses. EXAM: CT ANGIOGRAPHY HEAD AND NECK TECHNIQUE: Multidetector CT imaging of the head and neck was performed using the standard protocol during bolus administration of intravenous contrast. Multiplanar CT image reconstructions and MIPs were obtained to evaluate the vascular anatomy. Carotid stenosis measurements (when applicable) are obtained utilizing NASCET criteria, using the distal internal carotid diameter as the denominator.  CONTRAST:  66mL ISOVUE-370 IOPAMIDOL (ISOVUE-370) INJECTION 76% COMPARISON:  Four-vessel angiograms in 2016. Brain MRI 05/23/2015. Brain MRI and intracranial MRA 02/23/2015. FINDINGS: CT HEAD Brain: Stable cerebral volume since 2016. Chronic encephalomalacia in the left hemisphere affecting the temporal and occipital lobes. Chronic lacunar infarct of the left corona radiata better demonstrated on prior MRI. A small chronic infarct in the left cerebellum is also stable (series 6, image 9). No midline shift, ventriculomegaly, mass effect, evidence of mass lesion, intracranial hemorrhage or evidence of cortically based acute infarction. Calvarium and skull base: No acute osseous abnormality identified. Paranasal sinuses: Visualized paranasal sinuses and mastoids are clear. Orbits: Visualized orbits and scalp soft tissues are within normal limits. CTA NECK Skeleton: Poor dentition. Cervical spine degeneration. No acute osseous abnormality identified. Upper chest: Centrilobular emphysema. No superior mediastinal lymphadenopathy. Other neck: No acute findings. A granular appearance of the parotid gland suggests some prior salivary gland inflammation. Aortic arch: Moderate soft and calcified arch atherosclerosis. Three vessel arch configuration. Right carotid system: No brachiocephalic artery stenosis despite soft and calcified plaque. No right CCA origin stenosis. Moderate circumferential soft plaque in the proximal right CCA at the level of the thyroid without significant stenosis (series 5, image 146). Continued plaque to the right carotid bifurcation. Superimposed calcified plaque at the right ICA origin and bulb resulting in 72 % stenosis with respect to the distal vessel (series 5, image 110). There is 50% stenosis at the distal bulb downstream (image 105). The right ICA remains patent to the skull base. Left carotid system: Occluded left common carotid artery at its origin. At the left carotid bifurcation there is  reconstituted enhancement of the left external carotid artery, but the left ICA origin is occluded. No reconstituted enhancement of the left ICA to the skull base. Vertebral arteries: High-grade radiographic string sign stenosis of the right subclavian artery origin (series 5, image 155 and series 11, images 76 and 77. This is numerically estimated at 90 % with respect to the distal vessel. The right subclavian remains patent. There is superimposed moderate to severe stenosis of the right subclavian artery origin seen on series 11, image 88 and series 13, image 58. The right vertebral is patent and mildly tortuous to the skull base without additional stenosis. Occluded left subclavian artery  at its origin. There is a diminutive enhancing left Vertebral artery proximal V2 segment which appears reconstituted from muscular branches. The mid and distal V2 segment has a slightly larger, more normal caliber, with multiple muscular branch collaterals noted. The left vertebral maintains this caliber and is patent to the skull base without additional stenosis. CTA HEAD Posterior circulation: Dominant appearing distal left vertebral artery, the right appears to functionally terminates in PICA. The left PICA origin is patent. The vertebrobasilar junction and basilar arteries are patent although diminutive. AICA and SCA origins are patent. There are fetal type PCA origins, and that on the right may reconstitute some of the distal basilar. Prominent caliber right posterior communicating artery. More normal caliber left PCOM. The left PCA branches are within normal limits. There is moderate irregularity of the distal right PCA branches. Anterior circulation: The left ICA siphon is occluded until reconstituted at the level of the left ophthalmic artery origin (series 5, image 52). The left posterior communicating artery is also patent near this level. The left ICA terminus is patent but diminutive. The left ACA A1 segment is  diminutive or absent. The left MCA M1, left MCA bifurcation, and left MCA branches are patent but diminutive. The right ICA siphon is patent and mildly hyperplastic with moderate calcified plaque but no significant stenosis. Normal right ophthalmic and posterior communicating artery origins. Normal right ICA terminus, right MCA and ACA origins. The right A1 is dominant. The anterior communicating artery is present. Bilateral ACA branches are within normal limits. Right MCA M1 and bifurcation are patent without stenosis. Right MCA branches are within normal limits and slightly larger than those on the left. Venous sinuses: Patent. Anatomic variants: Fetal type PCA origins. Mildly dominant distal left vertebral artery. Diminutive or absent left ACA A1 segment. Review of the MIP images confirms the above findings IMPRESSION: 1. Chronically occluded Left CCA, cervical Left ICA, proximal Left subclavian, and proximal Left vertebral arteries. - Left ICA is reconstituted distally from the left ophthalmic and posterior communicating arteries. - Left vertebral artery is reconstituted from muscular collaterals. - Left MCA is patent but diminutive. - Left ACA is supplied from the right side. 2. Bulky atherosclerosis of the contralateral cervical Right Carotid Artery with significant stenosis of the proximal Right ICA estimated at 72% (series 5, image 110). 3. Positive for High-grade (Radiographic-String-Sign) stenosis of the Right Subclavian Artery origin. 4. Moderate to Severe stenosis of the Right Vertebral Artery origin. The right vertebral appears to functionally terminates in PICA. 5. CT appearance of the brain is stable from the 2016 MRI; chronic left hemisphere ischemia. 6. Aortic Atherosclerosis (ICD10-I70.0) and Emphysema (ICD10-J43.9). Electronically Signed   By: Genevie Ann M.D.   On: 06/25/2019 13:07   Vas US Carotid  Result Date: 06/17/2019 Carotid Arterial Duplex Study Indications:       Carotid artery disease.  Risk Factors:      None. Comparison Study:  05/07/18 Right 40-59% ICA stenosis (right only exam)                    05/22/16 Right 60-79% ICA stenosis. Right subc >50%. Left                    CCA/ICA occlusion. Performing Technologist: June Leap RDMS, RVT  Examination Guidelines: A complete evaluation includes B-mode imaging, spectral Doppler, color Doppler, and power Doppler as needed of all accessible portions of each vessel. Bilateral testing is considered an integral part of a complete examination.  Limited examinations for reoccurring indications may be performed as noted.  Right Carotid Findings: +----------+--------+--------+--------+---------------------+------------------+             PSV cm/s EDV cm/s Stenosis Describe              Comments            +----------+--------+--------+--------+---------------------+------------------+  CCA Prox   126      29                heterogenous                              +----------+--------+--------+--------+---------------------+------------------+  CCA Mid    116      34                                                          +----------+--------+--------+--------+---------------------+------------------+  CCA Distal 117      27                heterogenous                              +----------+--------+--------+--------+---------------------+------------------+  ICA Prox   240      47       60-79%   heterogenous,         acoustic shadowing                                         calcific and          may obscure higher                                         irregular             velocities          +----------+--------+--------+--------+---------------------+------------------+  ICA Mid    180      42                                                          +----------+--------+--------+--------+---------------------+------------------+  ICA Distal 174      28                                                           +----------+--------+--------+--------+---------------------+------------------+  ECA        733      60       >50%     heterogenous and  irregular                                 +----------+--------+--------+--------+---------------------+------------------+ +----------+--------+-------+--------+-------------------+             PSV cm/s EDV cms Describe Arm Pressure (mmHG)  +----------+--------+-------+--------+-------------------+  Subclavian 432              Stenotic                      +----------+--------+-------+--------+-------------------+ +---------+--------+--+--------+--+---------+  Vertebral PSV cm/s 44 EDV cm/s 12 Antegrade  +---------+--------+--+--------+--+---------+  Left Carotid Findings: +----------+--------+--------+--------+--------+---------------+             PSV cm/s EDV cm/s Stenosis Describe Comments         +----------+--------+--------+--------+--------+---------------+  CCA Prox                     Occluded                           +----------+--------+--------+--------+--------+---------------+  CCA Mid                      Occluded                           +----------+--------+--------+--------+--------+---------------+  CCA Distal                   Occluded                           +----------+--------+--------+--------+--------+---------------+  ICA Prox                     Occluded                           +----------+--------+--------+--------+--------+---------------+  ICA Mid                      Occluded                           +----------+--------+--------+--------+--------+---------------+  ECA        94                                  retrograde flow  +----------+--------+--------+--------+--------+---------------+ +----------+--------+--------+-------------------+-------------------+  Subclavian PSV cm/s EDV cm/s Describe            Arm Pressure (mmHG)   +----------+--------+--------+-------------------+-------------------+             73                monophasic waveform                      +----------+--------+--------+-------------------+-------------------+ +---------+--------+--------+--------+  Vertebral PSV cm/s EDV cm/s occluded  +---------+--------+--------+--------+  Summary: Right Carotid: ICA 60-79% stenosis, based on systolic velocity, and [id number]                stenosis based on diastolic velocity.                >50% ECA stenosis.                >50% subclavian stenosis. Left  Carotid: Occluded CCA and ICA.  *See table(s) above for measurements and observations.  Electronically signed by Antony Contras MD on 06/17/2019 at 4:30:56 PM.    Final    Vas [place] Transcranial Doppler  Result Date: 06/17/2019  Transcranial Doppler Indications: Carotid artery disease. Limitations: limited transtemporal windows. Comparison Study: no prior Performing Technologist: June Leap RDMS, RVT  Examination Guidelines: A complete evaluation includes B-mode imaging, spectral Doppler, color Doppler, and power Doppler as needed of all accessible portions of each vessel. Bilateral testing is considered an integral part of a complete examination. Limited examinations for reoccurring indications may be performed as noted.  +----------+-------------+----------+-----------+--------------+  RIGHT TCD  Right VM (cm) Depth (cm) Pulsatility    Comment      +----------+-------------+----------+-----------+--------------+  MCA            95.00                   1.28                     +----------+-------------+----------+-----------+--------------+  ACA           -87.00                   0.98                     +----------+-------------+----------+-----------+--------------+  Term ICA       93.00                   1.29                     +----------+-------------+----------+-----------+--------------+  PCA            13.00                     3                       +----------+-------------+----------+-----------+--------------+  Opthalmic      25.00                   1.67                     +----------+-------------+----------+-----------+--------------+  ICA siphon                                      not visualized  +----------+-------------+----------+-----------+--------------+  Vertebral     -49.00                   0.93                     +----------+-------------+----------+-----------+--------------+  +----------+------------+----------+-----------+----------------+  LEFT TCD   Left VM (cm) Depth (cm) Pulsatility     Comment       +----------+------------+----------+-----------+----------------+  MCA           28.00                   1.12                       +----------+------------+----------+-----------+----------------+  ACA           -19.00                  1.15                       +----------+------------+----------+-----------+----------------+  Term ICA                                        not visualized   +----------+------------+----------+-----------+----------------+  PCA           17.00                   1.71                       +----------+------------+----------+-----------+----------------+  Opthalmic     19.00                   1.07                       +----------+------------+----------+-----------+----------------+  ICA siphon                                      not visualized   +----------+------------+----------+-----------+----------------+  Vertebral     -11.00                  2.46     to- fro waveform  +----------+------------+----------+-----------+----------------+  +------------+-------+----------------+               VM cm/s     Comment       +------------+-------+----------------+  Prox Basilar -20.00  to- fro waveform  +------------+-------+----------------+  Dist Basilar -24.00  to- fro waveform  +------------+-------+----------------+ Summary:  Elevated mean flow velocities in right middle cerebral, anterior cerebral,  terminal right carotid with low mean flow velocities in anterior circulation on left suggests likely left ICA occlusion with compensatory flow from anterior circulation from right  side. Abnormal left vertebral and basilar artery waveforms suggest likely proximal occlusion.Correlate with CT angiogram if clinically indicated *See table(s) above for measurements and observations.  Diagnosing physician: Antony Contras MD Electronically signed by Antony Contras MD on 06/17/2019 at 4:49:13 PM.    Final     Labs:  CBC: Recent Labs    07/03/19 0902  WBC 12.4*  HGB 12.9*  HCT 39.2  PLT 230    COAGS: Recent Labs    07/03/19 0902  INR 1.0    BMP: Recent Labs    07/03/19 0902  NA 132*  K 4.0  CL 96*  CO2 26  GLUCOSE 79  BUN 6  CALCIUM 9.1  CREATININE 1.12  GFRNONAA >60  GFRAA >60    LIVER FUNCTION TESTS: No results for input(s): BILITOT, AST, ALT, ALKPHOS, PROT, ALBUMIN in the last 8760 hours.  TUMOR MARKERS: No results for input(s): AFPTM, CEA, CA199, CHROMGRNA in the last 8760 hours.  Assessment and Plan:  58 y/o M with history of balloon angioplasty of the right focal stenosis in the proximal right subclavian artery with residual stenoses of 50% 05/28/15 with Dr. [name]. He has been followed by Dr. Leonie Man and underwent bilateral carotid and transcranial doppler US on 06/16/19 which showed right ICA, ECA and subclavian stenosis as well as occluded left CCA and ICA. He was referred back to [facility] for repeat diagnostic angiogram for which he presents today.   Patient has been NPO since midnight, he continues on ASA + Plavix. Afebrile, WBC 12.4, hgb 12.9, plt 230, creatinine 1.12, INR 1.0.  Risks and benefits of cerebral  angiogram were discussed with  the patient and his sister including, but not limited to bleeding, infection, vascular injury, stroke, or contrast induced renal failure.  This interventional procedure involves the use of X-rays and because of the nature of the planned  procedure, it is possible that we will have prolonged use of X-ray fluoroscopy. Potential radiation risks to you include (but are not limited to) the following: - A slightly elevated risk for cancer  several years later in life. This risk is typically less than 0.5% percent. This risk is low in comparison to the normal incidence of human cancer, which is 33% for women and 50% for men according to the [place]. - Radiation induced injury can include skin redness, resembling a rash, tissue breakdown / ulcers and hair loss (which can be temporary or permanent).   The likelihood of either of these occurring depends on the difficulty of the procedure and whether you are sensitive to radiation due to previous procedures, disease, or genetic conditions.   IF your procedure requires a prolonged use of radiation, you will be notified and given written instructions for further action.  It is your responsibility to monitor the irradiated area for the 2 weeks following the procedure and to notify your physician if you are concerned that you have suffered a radiation induced injury.    All of the patient's questions were answered, patient is agreeable to proceed.  Consent signed and in chart.  Thank you for this interesting consult.  I greatly enjoyed meeting JAHAD KHATOON and look forward to participating in their care.  A copy of this report was sent to the requesting provider on this date.  Electronically Signed: Joaquim Nam, PA-C 07/03/2019, 9:59 AM   I spent a total of 40 Minutes  in face to face in clinical consultation, greater than 50% of which was counseling/coordinating care for diagnostic cerebral angiogram.

## 2019-07-03 NOTE — Discharge Instructions (Signed)
Femoral Site Care °This sheet gives you information about how to care for yourself after your procedure. Your health care provider may also give you more specific instructions. If you have problems or questions, contact your health care provider. °What can I expect after the procedure? °After the procedure, it is common to have: °· Bruising that usually fades within 1-2 weeks. °· Tenderness at the site. °Follow these instructions at home: °Wound care °· Follow instructions from your health care provider about how to take care of your insertion site. Make sure you: °? Wash your hands with soap and water before you change your bandage (dressing). If soap and water are not available, use hand sanitizer. °? Change your dressing as told by your health care provider. °? Leave stitches (sutures), skin glue, or adhesive strips in place. These skin closures may need to stay in place for 2 weeks or longer. If adhesive strip edges start to loosen and curl up, you may trim the loose edges. Do not remove adhesive strips completely unless your health care provider tells you to do that. °· Do not take baths, swim, or use a hot tub until your health care provider approves. °· You may shower 24-48 hours after the procedure or as told by your health care provider. °? Gently wash the site with plain soap and water. °? Pat the area dry with a clean towel. °? Do not rub the site. This may cause bleeding. °· Do not apply powder or lotion to the site. Keep the site clean and dry. °· Check your femoral site every day for signs of infection. Check for: °? Redness, swelling, or pain. °? Fluid or blood. °? Warmth. °? Pus or a bad smell. °Activity °· For the first 2-3 days after your procedure, or as long as directed: °? Avoid climbing stairs as much as possible. °? Do not squat. °· Do not lift anything that is heavier than 10 lb (4.5 kg), or the limit that you are told, until your health care provider says that it is safe. °· Rest as  directed. °? Avoid sitting for a long time without moving. Get up to take short walks every 1-2 hours. °· Do not drive for 24 hours if you were given a medicine to help you relax (sedative). °General instructions °· Take over-the-counter and prescription medicines only as told by your health care provider. °· Keep all follow-up visits as told by your health care provider. This is important. °Contact a health care provider if you have: °· A fever or chills. °· You have redness, swelling, or pain around your insertion site. °Get help right away if: °· The catheter insertion area swells very fast. °· You pass out. °· You suddenly start to sweat or your skin gets clammy. °· The catheter insertion area is bleeding, and the bleeding does not stop when you hold steady pressure on the area. °· The area near or just beyond the catheter insertion site becomes pale, cool, tingly, or numb. °These symptoms may represent a serious problem that is an emergency. Do not wait to see if the symptoms will go away. Get medical help right away. Call your local emergency services (911 in the [place] Do not drive yourself to the hospital. °Summary °· After the procedure, it is common to have bruising that usually fades within 1-2 weeks. °· Check your femoral site every day for signs of infection. °· Do not lift anything that is heavier than 10 lb (4.5 kg), or the   limit that you are told, until your health care provider says that it is safe. °This information is not intended to replace advice given to you by your health care provider. Make sure you discuss any questions you have with your health care provider. °Document Released: 06/19/2014 Document Revised: 10/29/2017 Document Reviewed: 10/29/2017 °Elsevier Patient Education © 2020 [employer] ° ° ° °Moderate Conscious Sedation, Adult, Care After °These instructions provide you with information about caring for yourself after your procedure. Your health care provider may also give you  more specific instructions. Your treatment has been planned according to current medical practices, but problems sometimes occur. Call your health care provider if you have any problems or questions after your procedure. °What can I expect after the procedure? °After your procedure, it is common: °· To feel sleepy for several hours. °· To feel clumsy and have poor balance for several hours. °· To have poor judgment for several hours. °· To vomit if you eat too soon. °Follow these instructions at home: °For at least 24 hours after the procedure: ° °· Do not: °? Participate in activities where you could fall or become injured. °? Drive. °? Use heavy machinery. °? Drink alcohol. °? Take sleeping pills or medicines that cause drowsiness. °? Make important decisions or sign legal documents. °? Take care of children on your own. °· Rest. °Eating and drinking °· Follow the diet recommended by your health care provider. °· If you vomit: °? Drink water, juice, or soup when you can drink without vomiting. °? Make sure you have little or no nausea before eating solid foods. °General instructions °· Have a responsible adult stay with you until you are awake and alert. °· Take over-the-counter and prescription medicines only as told by your health care provider. °· If you smoke, do not smoke without supervision. °· Keep all follow-up visits as told by your health care provider. This is important. °Contact a health care provider if: °· You keep feeling nauseous or you keep vomiting. °· You feel light-headed. °· You develop a rash. °· You have a fever. °Get help right away if: °· You have trouble breathing. °This information is not intended to replace advice given to you by your health care provider. Make sure you discuss any questions you have with your health care provider. °Document Released: 08/06/2013 Document Revised: 09/28/2017 Document Reviewed: 02/05/2016 °Elsevier Patient Education © 2020 [employer] Inc[employer] ° °

## 2019-07-03 NOTE — Sedation Documentation (Signed)
Right groin checked in short stay. Level 0. Dressing clean, dry, intact.

## 2019-07-07 ENCOUNTER — Encounter (HOSPITAL_COMMUNITY): Payer: Self-pay | Admitting: Interventional Radiology

## 2019-07-11 ENCOUNTER — Ambulatory Visit (HOSPITAL_COMMUNITY): Payer: Medicare Other

## 2019-09-27 ENCOUNTER — Other Ambulatory Visit: Payer: Self-pay

## 2019-09-27 ENCOUNTER — Emergency Department (HOSPITAL_COMMUNITY): Payer: Medicare Other

## 2019-09-27 ENCOUNTER — Encounter (HOSPITAL_COMMUNITY): Payer: Self-pay | Admitting: Emergency Medicine

## 2019-09-27 ENCOUNTER — Encounter (HOSPITAL_COMMUNITY)
Admission: EM | Disposition: A | Discharge status: Home / Self Care | Payer: Self-pay | Source: Home / Self Care | Attending: Internal Medicine

## 2019-09-27 ENCOUNTER — Inpatient Hospital Stay (HOSPITAL_COMMUNITY)
Admission: EM | Admit: 2019-09-27 | Discharge: 2019-10-01 | DRG: 377 | Disposition: A | Discharge status: Home / Self Care | Payer: Medicare Other | Attending: Family Medicine | Admitting: Family Medicine

## 2019-09-27 DIAGNOSIS — E876 Hypokalemia: Secondary | ICD-10-CM | POA: Diagnosis not present

## 2019-09-27 DIAGNOSIS — I6522 Occlusion and stenosis of left carotid artery: Secondary | ICD-10-CM | POA: Diagnosis present

## 2019-09-27 DIAGNOSIS — K449 Diaphragmatic hernia without obstruction or gangrene: Secondary | ICD-10-CM | POA: Diagnosis present

## 2019-09-27 DIAGNOSIS — I998 Other disorder of circulatory system: Secondary | ICD-10-CM

## 2019-09-27 DIAGNOSIS — R579 Shock, unspecified: Secondary | ICD-10-CM

## 2019-09-27 DIAGNOSIS — Z7902 Long term (current) use of antithrombotics/antiplatelets: Secondary | ICD-10-CM

## 2019-09-27 DIAGNOSIS — I2582 Chronic total occlusion of coronary artery: Secondary | ICD-10-CM | POA: Diagnosis present

## 2019-09-27 DIAGNOSIS — Z823 Family history of stroke: Secondary | ICD-10-CM

## 2019-09-27 DIAGNOSIS — I6529 Occlusion and stenosis of unspecified carotid artery: Secondary | ICD-10-CM | POA: Diagnosis present

## 2019-09-27 DIAGNOSIS — D62 Acute posthemorrhagic anemia: Secondary | ICD-10-CM | POA: Diagnosis not present

## 2019-09-27 DIAGNOSIS — I4891 Unspecified atrial fibrillation: Secondary | ICD-10-CM | POA: Diagnosis present

## 2019-09-27 DIAGNOSIS — Z7982 Long term (current) use of aspirin: Secondary | ICD-10-CM

## 2019-09-27 DIAGNOSIS — R338 Other retention of urine: Secondary | ICD-10-CM | POA: Diagnosis present

## 2019-09-27 DIAGNOSIS — R569 Unspecified convulsions: Secondary | ICD-10-CM

## 2019-09-27 DIAGNOSIS — Z96643 Presence of artificial hip joint, bilateral: Secondary | ICD-10-CM | POA: Diagnosis present

## 2019-09-27 DIAGNOSIS — R9431 Abnormal electrocardiogram [ECG] [EKG]: Secondary | ICD-10-CM | POA: Diagnosis not present

## 2019-09-27 DIAGNOSIS — D497 Neoplasm of unspecified behavior of endocrine glands and other parts of nervous system: Secondary | ICD-10-CM | POA: Diagnosis present

## 2019-09-27 DIAGNOSIS — I708 Atherosclerosis of other arteries: Secondary | ICD-10-CM | POA: Diagnosis present

## 2019-09-27 DIAGNOSIS — I739 Peripheral vascular disease, unspecified: Secondary | ICD-10-CM | POA: Diagnosis present

## 2019-09-27 DIAGNOSIS — K921 Melena: Secondary | ICD-10-CM | POA: Diagnosis present

## 2019-09-27 DIAGNOSIS — I2510 Atherosclerotic heart disease of native coronary artery without angina pectoris: Secondary | ICD-10-CM | POA: Diagnosis present

## 2019-09-27 DIAGNOSIS — R778 Other specified abnormalities of plasma proteins: Secondary | ICD-10-CM | POA: Diagnosis not present

## 2019-09-27 DIAGNOSIS — K250 Acute gastric ulcer with hemorrhage: Secondary | ICD-10-CM | POA: Diagnosis present

## 2019-09-27 DIAGNOSIS — I21A1 Myocardial infarction type 2: Secondary | ICD-10-CM | POA: Diagnosis present

## 2019-09-27 DIAGNOSIS — I745 Embolism and thrombosis of iliac artery: Secondary | ICD-10-CM | POA: Diagnosis present

## 2019-09-27 DIAGNOSIS — K922 Gastrointestinal hemorrhage, unspecified: Secondary | ICD-10-CM

## 2019-09-27 DIAGNOSIS — R578 Other shock: Secondary | ICD-10-CM | POA: Diagnosis present

## 2019-09-27 DIAGNOSIS — E785 Hyperlipidemia, unspecified: Secondary | ICD-10-CM | POA: Diagnosis present

## 2019-09-27 DIAGNOSIS — R262 Difficulty in walking, not elsewhere classified: Secondary | ICD-10-CM | POA: Diagnosis present

## 2019-09-27 DIAGNOSIS — Z8249 Family history of ischemic heart disease and other diseases of the circulatory system: Secondary | ICD-10-CM

## 2019-09-27 DIAGNOSIS — I252 Old myocardial infarction: Secondary | ICD-10-CM

## 2019-09-27 DIAGNOSIS — Z8673 Personal history of transient ischemic attack (TIA), and cerebral infarction without residual deficits: Secondary | ICD-10-CM

## 2019-09-27 DIAGNOSIS — I701 Atherosclerosis of renal artery: Secondary | ICD-10-CM | POA: Diagnosis present

## 2019-09-27 DIAGNOSIS — I248 Other forms of acute ischemic heart disease: Secondary | ICD-10-CM | POA: Diagnosis not present

## 2019-09-27 DIAGNOSIS — Z87891 Personal history of nicotine dependence: Secondary | ICD-10-CM

## 2019-09-27 DIAGNOSIS — J449 Chronic obstructive pulmonary disease, unspecified: Secondary | ICD-10-CM | POA: Diagnosis present

## 2019-09-27 DIAGNOSIS — I771 Stricture of artery: Secondary | ICD-10-CM | POA: Diagnosis present

## 2019-09-27 DIAGNOSIS — R7989 Other specified abnormal findings of blood chemistry: Secondary | ICD-10-CM

## 2019-09-27 DIAGNOSIS — Z20828 Contact with and (suspected) exposure to other viral communicable diseases: Secondary | ICD-10-CM | POA: Diagnosis present

## 2019-09-27 DIAGNOSIS — Z79899 Other long term (current) drug therapy: Secondary | ICD-10-CM

## 2019-09-27 DIAGNOSIS — I214 Non-ST elevation (NSTEMI) myocardial infarction: Secondary | ICD-10-CM

## 2019-09-27 LAB — CBC WITH DIFFERENTIAL/PLATELET
Abs Immature Granulocytes: 0.15 10*3/uL — ABNORMAL HIGH (ref 0.00–0.07)
Basophils Absolute: 0.1 10*3/uL (ref 0.0–0.1)
Basophils Relative: 0 %
Eosinophils Absolute: 0 10*3/uL (ref 0.0–0.5)
Eosinophils Relative: 0 %
HCT: 21.2 % — ABNORMAL LOW (ref 39.0–52.0)
Hemoglobin: 6.7 g/dL — CL (ref 13.0–17.0)
Immature Granulocytes: 1 %
Lymphocytes Relative: 15 %
Lymphs Abs: 2.9 10*3/uL (ref 0.7–4.0)
MCH: 29.3 pg (ref 26.0–34.0)
MCHC: 31.6 g/dL (ref 30.0–36.0)
MCV: 92.6 fL (ref 80.0–100.0)
Monocytes Absolute: 0.9 10*3/uL (ref 0.1–1.0)
Monocytes Relative: 5 %
Neutro Abs: 15.4 10*3/uL — ABNORMAL HIGH (ref 1.7–7.7)
Neutrophils Relative %: 79 %
Platelets: 259 10*3/uL (ref 150–400)
RBC: 2.29 MIL/uL — ABNORMAL LOW (ref 4.22–5.81)
RDW: 13.4 % (ref 11.5–15.5)
WBC: 19.3 10*3/uL — ABNORMAL HIGH (ref 4.0–10.5)
nRBC: 0 % (ref 0.0–0.2)

## 2019-09-27 LAB — COMPREHENSIVE METABOLIC PANEL
ALT: 10 U/L (ref 0–44)
AST: 19 U/L (ref 15–41)
Albumin: 3.4 g/dL — ABNORMAL LOW (ref 3.5–5.0)
Alkaline Phosphatase: 31 U/L — ABNORMAL LOW (ref 38–126)
Anion gap: 19 — ABNORMAL HIGH (ref 5–15)
BUN: 45 mg/dL — ABNORMAL HIGH (ref 6–20)
CO2: 15 mmol/L — ABNORMAL LOW (ref 22–32)
Calcium: 8.7 mg/dL — ABNORMAL LOW (ref 8.9–10.3)
Chloride: 103 mmol/L (ref 98–111)
Creatinine, Ser: 1.15 mg/dL (ref 0.61–1.24)
GFR calc Af Amer: >60 mL/min (ref >60)
GFR calc non Af Amer: >60 mL/min (ref >60)
Glucose, Bld: 128 mg/dL — ABNORMAL HIGH (ref 70–99)
Potassium: 3.9 mmol/L (ref 3.5–5.1)
Sodium: 137 mmol/L (ref 135–145)
Total Bilirubin: 0.5 mg/dL (ref 0.3–1.2)
Total Protein: 5.5 g/dL — ABNORMAL LOW (ref 6.5–8.1)

## 2019-09-27 LAB — CBC
HCT: 35.4 % — ABNORMAL LOW (ref 39.0–52.0)
Hemoglobin: 12.6 g/dL — ABNORMAL LOW (ref 13.0–17.0)
MCH: 30.5 pg (ref 26.0–34.0)
MCHC: 35.6 g/dL (ref 30.0–36.0)
MCV: 85.7 fL (ref 80.0–100.0)
Platelets: 167 10*3/uL (ref 150–400)
RBC: 4.13 MIL/uL — ABNORMAL LOW (ref 4.22–5.81)
RDW: 13.6 % (ref 11.5–15.5)
WBC: 24.9 10*3/uL — ABNORMAL HIGH (ref 4.0–10.5)
nRBC: 0 % (ref 0.0–0.2)

## 2019-09-27 LAB — I-STAT CHEM 8, ED
BUN: 47 mg/dL — ABNORMAL HIGH (ref 6–20)
Calcium, Ion: 1.11 mmol/L — ABNORMAL LOW (ref 1.15–1.40)
Chloride: 103 mmol/L (ref 98–111)
Creatinine, Ser: 1 mg/dL (ref 0.61–1.24)
Glucose, Bld: 123 mg/dL — ABNORMAL HIGH (ref 70–99)
HCT: 18 % — ABNORMAL LOW (ref 39.0–52.0)
Hemoglobin: 6.1 g/dL — CL (ref 13.0–17.0)
Potassium: 4.1 mmol/L (ref 3.5–5.1)
Sodium: 135 mmol/L (ref 135–145)
TCO2: 18 mmol/L — ABNORMAL LOW (ref 22–32)

## 2019-09-27 LAB — POC OCCULT BLOOD, ED: Fecal Occult Bld: POSITIVE — AB

## 2019-09-27 LAB — HEMOGLOBIN AND HEMATOCRIT, BLOOD
HCT: 40.6 % (ref 39.0–52.0)
Hemoglobin: 13.7 g/dL (ref 13.0–17.0)

## 2019-09-27 LAB — TROPONIN I (HIGH SENSITIVITY): Troponin I (High Sensitivity): 3542 ng/L (ref <18)

## 2019-09-27 LAB — PREPARE RBC (CROSSMATCH)

## 2019-09-27 LAB — HIV ANTIBODY (ROUTINE TESTING W REFLEX): HIV Screen 4th Generation wRfx: NONREACTIVE

## 2019-09-27 LAB — LACTIC ACID, PLASMA
Lactic Acid, Venous: 9.7 mmol/L (ref 0.5–1.9)
Lactic Acid, Venous: 4.9 mmol/L (ref 0.5–1.9)
Lactic Acid, Venous: 1 mmol/L (ref 0.5–1.9)

## 2019-09-27 LAB — MRSA PCR SCREENING: MRSA by PCR: NEGATIVE

## 2019-09-27 LAB — SARS CORONAVIRUS 2 BY RT PCR (HOSPITAL ORDER, PERFORMED IN ~~LOC~~ HOSPITAL LAB): SARS Coronavirus 2: NEGATIVE

## 2019-09-27 LAB — PROTIME-INR
INR: 1.3 — ABNORMAL HIGH (ref 0.8–1.2)
Prothrombin Time: 15.6 seconds — ABNORMAL HIGH (ref 11.4–15.2)

## 2019-09-27 LAB — CBG MONITORING, ED: Glucose-Capillary: 74 mg/dL (ref 70–99)

## 2019-09-27 LAB — GLUCOSE, CAPILLARY: Glucose-Capillary: 110 mg/dL — ABNORMAL HIGH (ref 70–99)

## 2019-09-27 LAB — ABO/RH: ABO/RH(D): B POS

## 2019-09-27 LAB — POC SARS CORONAVIRUS 2 AG -  ED: SARS Coronavirus 2 Ag: NEGATIVE

## 2019-09-27 SURGERY — CREATION, BYPASS, ARTERIAL, FEMORAL TO FEMORAL, USING GRAFT
Anesthesia: General | Laterality: Left

## 2019-09-27 MED ORDER — ORAL CARE MOUTH RINSE
15.0000 mL | Freq: Two times a day (BID) | OROMUCOSAL | Status: DC
  Administered 2019-09-27 – 2019-10-01 (×5): 15 mL via OROMUCOSAL

## 2019-09-27 MED ORDER — CHLORHEXIDINE GLUCONATE CLOTH 2 % EX PADS
6.0000 | MEDICATED_PAD | Freq: Every day | CUTANEOUS | Status: DC
  Administered 2019-09-27 – 2019-09-30 (×4): 6 via TOPICAL

## 2019-09-27 MED ORDER — PHENYLEPHRINE 40 MCG/ML (10ML) SYRINGE FOR IV PUSH (FOR BLOOD PRESSURE SUPPORT)
100.0000 ug | PREFILLED_SYRINGE | Freq: Once | INTRAVENOUS | Status: AC
  Administered 2019-09-27: 100 ug via INTRAVENOUS

## 2019-09-27 MED ORDER — FENTANYL CITRATE (PF) 100 MCG/2ML IJ SOLN
50.0000 ug | Freq: Once | INTRAMUSCULAR | Status: AC
  Administered 2019-09-27: 16:00:00 50 ug via INTRAVENOUS
  Filled 2019-09-27: qty 2

## 2019-09-27 MED ORDER — PANTOPRAZOLE SODIUM 40 MG IV SOLR
40.0000 mg | Freq: Two times a day (BID) | INTRAVENOUS | Status: DC
  Administered 2019-09-27 – 2019-10-01 (×9): 40 mg via INTRAVENOUS
  Filled 2019-09-27 (×8): qty 40

## 2019-09-27 MED ORDER — SODIUM CHLORIDE 0.9 % IV SOLN: 250.0000 mL | INTRAVENOUS | Status: DC

## 2019-09-27 MED ORDER — DEXTROSE-NACL 5-0.9 % IV SOLN
INTRAVENOUS | Status: DC
  Administered 2019-09-27: 21:00:00 via INTRAVENOUS
  Administered 2019-09-28 (×2): 50 mL/h via INTRAVENOUS
  Administered 2019-09-29 – 2019-09-30 (×2): via INTRAVENOUS

## 2019-09-27 MED ORDER — ACETAMINOPHEN 325 MG PO TABS: 650.0000 mg | ORAL_TABLET | ORAL | Status: DC | PRN

## 2019-09-27 MED ORDER — NOREPINEPHRINE 4 MG/250ML-% IV SOLN
0.0000 ug/min | INTRAVENOUS | Status: DC
  Administered 2019-09-27: 5 ug/min via INTRAVENOUS
  Administered 2019-09-27: 5.333 ug/min via INTRAVENOUS
  Filled 2019-09-27: qty 250

## 2019-09-27 MED ORDER — HEPARIN (PORCINE) 25000 UT/250ML-% IV SOLN: 1150.0000 [IU]/h | INTRAVENOUS | Status: DC

## 2019-09-27 MED ORDER — ASPIRIN EC 81 MG PO TBEC: 81.0000 mg | DELAYED_RELEASE_TABLET | Freq: Every day | ORAL | Status: DC

## 2019-09-27 MED ORDER — FENTANYL CITRATE (PF) 100 MCG/2ML IJ SOLN
50.0000 ug | Freq: Once | INTRAMUSCULAR | Status: AC
  Administered 2019-09-27: 50 ug via INTRAVENOUS

## 2019-09-27 MED ORDER — SODIUM CHLORIDE 0.9 % IV SOLN
20.0000 ug | Freq: Once | INTRAVENOUS | Status: AC
  Administered 2019-09-27: 20 ug via INTRAVENOUS
  Filled 2019-09-27: qty 5

## 2019-09-27 MED ORDER — HEPARIN BOLUS VIA INFUSION
4500.0000 [IU] | Freq: Once | INTRAVENOUS | Status: DC
  Filled 2019-09-27: qty 4500

## 2019-09-27 MED ORDER — SODIUM CHLORIDE 0.9% IV SOLUTION: Freq: Once | INTRAVENOUS | Status: DC

## 2019-09-27 MED ORDER — PHENYLEPHRINE 40 MCG/ML (10ML) SYRINGE FOR IV PUSH (FOR BLOOD PRESSURE SUPPORT)
100.0000 ug | PREFILLED_SYRINGE | Freq: Once | INTRAVENOUS | Status: AC
  Administered 2019-09-27: 16:00:00 100 ug via INTRAVENOUS

## 2019-09-27 MED ORDER — LORAZEPAM 2 MG/ML IJ SOLN
INTRAMUSCULAR | Status: AC
  Administered 2019-09-27: 16:00:00 0.5 mg
  Filled 2019-09-27: qty 2

## 2019-09-27 MED ORDER — FENTANYL CITRATE (PF) 100 MCG/2ML IJ SOLN: 50.0000 ug | INTRAMUSCULAR | Status: DC | PRN

## 2019-09-27 MED ORDER — SODIUM CHLORIDE 0.9 % IV SOLN
250.0000 mL | INTRAVENOUS | Status: DC
  Administered 2019-09-30: 250 mL via INTRAVENOUS

## 2019-09-27 MED ORDER — ONDANSETRON HCL 4 MG/2ML IJ SOLN: 4.0000 mg | Freq: Four times a day (QID) | INTRAMUSCULAR | Status: DC | PRN

## 2019-09-27 MED ORDER — LORAZEPAM 2 MG/ML IJ SOLN
0.5000 mg | Freq: Once | INTRAMUSCULAR | Status: AC
  Administered 2019-09-27: 0.5 mg via INTRAVENOUS

## 2019-09-27 MED ORDER — SODIUM CHLORIDE 0.9 % IV SOLN
1.0000 g | Freq: Once | INTRAVENOUS | Status: AC
  Administered 2019-09-27: 1 g via INTRAVENOUS
  Filled 2019-09-27: qty 10

## 2019-09-27 MED ORDER — FENTANYL CITRATE (PF) 100 MCG/2ML IJ SOLN
INTRAMUSCULAR | Status: AC
  Administered 2019-09-27: 18:00:00
  Filled 2019-09-27: qty 2

## 2019-09-27 MED ORDER — MAGNESIUM SULFATE 2 GM/50ML IV SOLN
2.0000 g | Freq: Once | INTRAVENOUS | Status: AC
  Administered 2019-09-27: 2 g via INTRAVENOUS
  Filled 2019-09-27: qty 50

## 2019-09-27 MED ORDER — NOREPINEPHRINE 4 MG/250ML-% IV SOLN
0.0000 ug/min | INTRAVENOUS | Status: DC
  Administered 2019-09-27 – 2019-09-28 (×2): 10 ug/min via INTRAVENOUS
  Filled 2019-09-27 (×2): qty 250

## 2019-09-27 MED ORDER — INSULIN ASPART 100 UNIT/ML ~~LOC~~ SOLN
0.0000 [IU] | SUBCUTANEOUS | Status: DC
  Administered 2019-09-28 – 2019-09-30 (×2): 1 [IU] via SUBCUTANEOUS

## 2019-09-27 MED ORDER — IOHEXOL 350 MG/ML SOLN
100.0000 mL | Freq: Once | INTRAVENOUS | Status: AC | PRN
  Administered 2019-09-27: 100 mL via INTRAVENOUS

## 2019-09-27 NOTE — Progress Notes (Signed)
eLink Physician-Brief Progress Note Patient Name: Raymond Carney DOB: 09-22-1961 MRN: [id number]   Date of Service  09/27/2019  HPI/Events of Note  on Levo, came to the unit at 77mcq, RN has titrated down to 16 because this is a PIV, B/P 93/68(79) by aline, I have noted ST depression on monitor and with the low Hgb was woundering if we should check Trop this evening with the F/U CBC, I told RN to grab a F/U LA with evening labs, Pt is NPO, do you want to order maintenance fluids   eICU Interventions  - troponin, CBC and D5 saline at 50 ml/hr ordered. Last hg > 13. Labs reviewed. Vasculopath.      Intervention Category Intermediate Interventions: Other:;Bleeding - evaluation and treatment with blood products;Hypotension - evaluation and management  Elmer Sow 09/27/2019, 8:55 PM

## 2019-09-27 NOTE — H&P (Signed)
NAME:  Raymond Carney, MRN:  [id number], DOB:  1960-11-21, LOS: 0 ADMISSION DATE:  09/27/2019, CONSULTATION DATE:  09/27/19 REFERRING MD:  ER, CHIEF COMPLAINT:  Leg pain   Brief History   58 year old man with history of peripheral vascular disease presenting with worsening left leg pain.  Left leg noted to be pale and cold to touch, sent for CTA and evaluated by vascular who feel this is a more chronic femoral occlusion that does not need immediate attention.  On way back from CT, patient had seizure like episode and became severely hypotensive.  H/H noted to be 6 g/dL from 13 two months ago.  Patient is on ibuprofen, dual antiplatelet therapy at home.  Has been having black stool unclear when started, no hematochezia, hematemesis.  No history of GIB.  No history of seizures.  PCCM asked to admit for possible seizure and acute hemorrhagic shock.  History of present illness   58 year old man with history of peripheral vascular disease presenting with worsening left leg pain.  Left leg noted to be pale and cold to touch, sent for CTA and evaluated by vascular who feel this is a more chronic femoral occlusion that does not need immediate attention.  On way back from CT, patient had seizure like episode and became severely hypotensive.  H/H noted to be 6 g/dL from 13 two months ago.  Patient is on ibuprofen, dual antiplatelet therapy at home.  Has been having black stool unclear when started, no hematochezia, hematemesis.  No history of GIB.  No history of seizures.  PCCM asked to admit for possible seizure and acute hemorrhagic shock.  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 rate (!) 24, height 5\' 8"  (1.727 m), weight 65 kg, SpO2 (!) 83 %.       No intake or output data in the 24 hours ending 09/27/19 1631 Filed Weights   09/27/19 1500  Weight: 65 kg    Examination: GEN: ill appearing man lying in bed HEENT: MMM, no thrush, pale conjunctiva CV: Irregular, tachycardic, upper ext lukewarm PULM: clear, no accessory muscle use GI: Soft, +BS, nontender EXT: both legs are pale and cool to touch, defer remainder of exam to vascular NEURO: became more and more oriented during exam, able to answer questions accurately PSYCH: RASS 0  SKIN: Pale Bedside echo- no pericardial effusion, RV decompressed, LVEF looks only slightly down 40-45%   Resolved Hospital Problem list   NA  Assessment & Plan:   # Seizure like activity in setting of acute on chronic anemia; my suspicion here is more for a syncopal event # Urinary retention now with foley # Shock, hemorrhagic vs. Vasovagal, hx of NSAID use and dual antiplatelets, black stool concerning for PUD.  4 units pRBC given in ER for severe shock and likely syncopal event. # Intermittent afib/RVR now better after rescuscitation # Severe peripheral arterial disease complicating noninvasive determination of blood pressures # Prior CVA with no residual  deficits  - Arterial line placed, wean levophed to maintain MAP > 65 - Hold plavix, can try aspirin tomorrow, the severity of his peripheral artery disease make me leery of holding altogether - Spot EEG, keppra given x 1 - f/u COVID test - Place foley to reduce vagal tone, 1L came out with this - PPI, trend H/H, GI will see in AM - Neurovascular checks of LE, fentanyl PRN pain, vascular aware of patient - Check echo, will not trend trops in absence of angina and that we will be  unable to do anything about a troponin leak  Best practice:  Diet: NPO Pain/Anxiety/Delirium protocol (if indicated): N/A VAP protocol (if indicated): N/A DVT prophylaxis: SCD to R leg GI prophylaxis: PPI3 Glucose control: SSI Mobility: BR Code Status: Full Family Communication: Updated at time of eval Disposition:  ICU as PUI pending negative COVID per institutional protocol  Labs   CBC: Recent Labs  Lab 09/27/19 1521 09/27/19 1526  WBC 19.3*  --   NEUTROABS 15.4*  --   HGB 6.7* 6.1*  HCT 21.2* 18.0*  MCV 92.6  --   PLT 259  --     Basic Metabolic Panel: Recent Labs  Lab 09/27/19 1521 09/27/19 1526  NA 137 135  K 3.9 4.1  CL 103 103  CO2 15*  --   GLUCOSE 128* 123*  BUN 45* 47*  CREATININE 1.15 1.00  CALCIUM 8.7*  --    GFR: Estimated Creatinine Clearance: 72.2 mL/min (by C-G formula based on SCr of 1 mg/dL). Recent Labs  Lab 09/27/19 1521  WBC 19.3*  LATICACIDVEN 9.7*    Liver Function Tests: Recent Labs  Lab 09/27/19 1521  AST 19  ALT 10  ALKPHOS 31*  BILITOT 0.5  PROT 5.5*  ALBUMIN 3.4*   No results for input(s): LIPASE, AMYLASE in the last 168 hours. No results for input(s): AMMONIA in the last 168 hours.  ABG    Component Value Date/Time   TCO2 18 (L) 09/27/2019 1526     Coagulation Profile: Recent Labs  Lab 09/27/19 1521  INR 1.3*    Cardiac Enzymes: No results for input(s): CKTOTAL, CKMB, CKMBINDEX, TROPONINI in the last 168 hours.  HbA1C: Hgb A1c MFr Bld  Date/Time Value Ref Range Status  05/24/2015 02:22 AM 5.9 (H) 4.8 - 5.6 % Final    Comment:    (NOTE)         Pre-diabetes: 5.7 - 6.4         Diabetes: >6.4         Glycemic control for adults with diabetes: <7.0     CBG: Recent Labs  Lab 09/27/19 1609  GLUCAP 74    Review of Systems:    Positive Symptoms in bold:  Constitutional fevers, chills, weight loss, fatigue, anorexia, malaise  Eyes decreased vision, double vision, eye irritation  Ears, Nose,  Mouth, Throat sore throat, trouble swallowing, sinus congestion  Cardiovascular chest pain, paroxysmal nocturnal dyspnea, lower ext edema, palpitations   Respiratory SOB, cough, DOE, hemoptysis, wheezing  Gastrointestinal nausea, vomiting, diarrhea  Genitourinary burning with urination, trouble urinating  Musculoskeletal joint aches, joint swelling, back pain  Integumentary  rashes, skin lesions  Neurological focal weakness, focal numbness, trouble speaking, headaches  Psychiatric depression, anxiety, confusion  Endocrine polyuria, polydipsia, cold intolerance, heat intolerance  Hematologic abnormal bruising, abnormal bleeding, unexplained nose bleeds  Allergic/Immunologic recurrent infections, hives, swollen lymph nodes     Past Medical History  He,  has a past medical  history of Carotid stenosis, COPD (chronic obstructive pulmonary disease) ([place]), History of stroke, Hyperlipidemia, Pituitary tumor, Stenosis of right subclavian artery ([place]), and TIA (transient ischemic attack).   Surgical History    Past Surgical History:  Procedure Laterality Date  . bilateral hip replacements    . HIP ARTHROPLASTY Bilateral    ON DISABILITY SECONDARY   . IR ANGIO INTRA EXTRACRAN SEL COM CAROTID INNOMINATE UNI R MOD SED  07/03/2019  . IR ANGIOGRAM EXTREMITY RIGHT  07/03/2019  . OTHER SURGICAL HISTORY  1996   HISTORY OF PITUITARY TUMOR REMOVAL CIRCA  . RADIOLOGY WITH ANESTHESIA N/A 05/26/2015   Procedure: RADIOLOGY WITH ANESTHESIA/ANGIOPLASTY;  Surgeon: Luanne Bras, MD;  Location: [facility];  Service: Radiology;  Laterality: N/A;  . TRANSPHENOIDAL / TRANSNASAL HYPOPHYSECTOMY / RESECTION PITUITARY TUMOR       Social History   reports that he quit smoking about 5 years ago. He smoked 0.50 packs per day. He has never used smokeless tobacco. He reports that he does not drink alcohol or use drugs.   Family History   His family history includes Atrial fibrillation in his mother; CAD in his father and  mother; CVA in his father; Colon cancer (age of onset: 59) in his sister; Congestive Heart Failure in his mother; Hypertension in his father and mother; Prostate cancer in his father; Stroke in his father. There is no history of Colon polyps or Liver cancer.   Allergies No Known Allergies   Home Medications  Prior to Admission medications   Medication Sig Start Date End Date Taking? Authorizing Provider  aspirin EC 81 MG tablet Take 1 tablet (81 mg total) by mouth daily. 04/02/18  Yes Weaver, Scott T, PA-C  clopidogrel (PLAVIX) 75 MG tablet Take 75 mg by mouth daily. 05/04/15  Yes [provider]  ezetimibe (ZETIA) 10 MG tablet Take 10 mg by mouth daily.  08/03/16  Yes [provider]  ibuprofen (ADVIL) 200 MG tablet Take 600 mg by mouth every 6 (six) hours as needed for moderate pain.   Yes [provider]  rosuvastatin (CRESTOR) 20 MG tablet Take 20 mg by mouth daily.   Yes [provider]     Critical care time: 45 minutes

## 2019-09-27 NOTE — Procedures (Signed)
Right radial a line placed using sterile seldinger technique and US guidance No immediate complications

## 2019-09-27 NOTE — Procedures (Signed)
Arterial Catheter Insertion Procedure Note UYLESS SAFRON [id number] [birth year]  Procedure: Insertion of Arterial Catheter  Indications: Blood pressure monitoring  Procedure Details Consent: Risks of procedure as well as the alternatives and risks of each were explained to the (patient/caregiver).  Consent for procedure obtained. Time Out: Verified patient identification, verified procedure, site/side was marked, verified correct patient position, special equipment/implants available, medications/allergies/relevent history reviewed, required imaging and test results available.  Performed  Maximum sterile technique was used including antiseptics, gloves, gown and mask. Skin prep: Chlorhexidine; local anesthetic administered 20 gauge catheter was inserted into right radial artery using the Seldinger technique. ULTRASOUND GUIDANCE USED: YES Evaluation Blood flow good; BP tracing good. Complications: No apparent complications.  A-line placed by Dr Idelia Salm  Rino Hosea 09/27/2019

## 2019-09-27 NOTE — ED Notes (Signed)
I Stat Chem 8 result for  Hemoglobin of 6.1 reported to Dr. Ralene Bathe.

## 2019-09-27 NOTE — ED Provider Notes (Signed)
[facility] Provider Note   CSN: [id number] Arrival date & time: 09/27/19  1419     History   Chief Complaint No chief complaint on file.   HPI Raymond [facility] is a 58 y.o. male.     The history is provided by the patient, a relative and medical records. No language interpreter was used.   Raymond [facility] is a 58 y.o. male who presents to the [facility] complaining of leg pain. Patient here complaining of three days of pain and numbness in his left leg. Pain is located from the knee down. He states that he has chronic pain in his leg secondary to hip issues but pain became significantly worse a few days ago. He is having difficulty walking secondary to pain. He also has altered sensation in the distal leg. History is limited as patient is a very vague historian. He currently lives with his father. He denies any additional symptoms. He does smoke cigarettes. Denies any alcohol or drug use but does have remote history of drug use. He is unable to specify what drugs he is used in the past. He states that he is compliant with all of his home medications. Past Medical History:  Diagnosis Date  . Carotid stenosis    [place] 0000000:  R [id number]; LICA occluded; prox L subcl occluded; R subcl > 50%  . COPD (chronic obstructive pulmonary disease) ([place])   . History of stroke    Echo 7/16:  EF 60-65  . Hyperlipidemia   . Pituitary tumor    s/p excision ~ 2000  . Stenosis of right subclavian artery ([place])    S/P STENT FOLLOWED BY DR. Leonie Man AND DR. [name]   . TIA (transient ischemic attack)     Patient Active Problem List   Diagnosis Date Noted  . GIB (gastrointestinal bleeding) 09/27/2019  . Stenosis of artery ([place])   . CVA (cerebral infarction) 05/23/2015  . Dysarthria   . Right sided weakness   . TIA (transient ischemic attack)     Past Surgical History:  Procedure Laterality Date  . bilateral hip replacements    . HIP ARTHROPLASTY  Bilateral    ON DISABILITY SECONDARY   . IR ANGIO INTRA EXTRACRAN SEL COM CAROTID INNOMINATE UNI R MOD SED  07/03/2019  . IR ANGIOGRAM EXTREMITY RIGHT  07/03/2019  . OTHER SURGICAL HISTORY  1996   HISTORY OF PITUITARY TUMOR REMOVAL CIRCA  . RADIOLOGY WITH ANESTHESIA N/A 05/26/2015   Procedure: RADIOLOGY WITH ANESTHESIA/ANGIOPLASTY;  Surgeon: Luanne Bras, MD;  Location: [facility];  Service: Radiology;  Laterality: N/A;  . TRANSPHENOIDAL / TRANSNASAL HYPOPHYSECTOMY / RESECTION PITUITARY TUMOR          Home Medications    Prior to Admission medications   Medication Sig Start Date End Date Taking? Authorizing Provider  aspirin EC 81 MG tablet Take 1 tablet (81 mg total) by mouth daily. 04/02/18  Yes Weaver, Scott T, PA-C  clopidogrel (PLAVIX) 75 MG tablet Take 75 mg by mouth daily. 05/04/15  Yes [provider]  ezetimibe (ZETIA) 10 MG tablet Take 10 mg by mouth daily.  08/03/16  Yes [provider]  ibuprofen (ADVIL) 200 MG tablet Take 600 mg by mouth every 6 (six) hours as needed for moderate pain.   Yes [provider]  rosuvastatin (CRESTOR) 20 MG tablet Take 20 mg by mouth daily.   Yes [provider]    Family History Family History  Problem Relation  Age of Onset  . Hypertension Mother   . CAD Mother   . Congestive Heart Failure Mother   . Atrial fibrillation Mother   . Stroke Father   . Hypertension Father   . CVA Father   . CAD Father        s/p CABG  . Prostate cancer Father   . Colon cancer Sister 87  . Colon polyps Neg Hx   . Liver cancer Neg Hx     Social History Social History   Tobacco Use  . Smoking status: Former Smoker    Packs/day: 0.50    Quit date: 01/22/2014    Years since quitting: 5.6  . Smokeless tobacco: Never Used  Substance Use Topics  . Alcohol use: No  . Drug use: Never     Allergies   Patient has no known allergies.   Review of Systems Review of Systems  All other systems reviewed and are negative.     Physical Exam Updated Vital Signs BP (!) 85/74   Pulse 72   Temp 98.6 F (37 C)   Resp 19   Ht 5\' 8"  (1.727 m)   Wt 65 kg   SpO2 100%   BMI 21.79 kg/m   Physical Exam Vitals signs and nursing note reviewed.  Constitutional:      General: He is in acute distress.     Appearance: He is well-developed. He is ill-appearing.  HENT:     Head: Normocephalic and atraumatic.  Cardiovascular:     Rate and Rhythm: Regular rhythm. Tachycardia present.     Heart sounds: No murmur.  Pulmonary:     Effort: Pulmonary effort is normal. No respiratory distress.     Breath sounds: Normal breath sounds.  Abdominal:     Palpations: Abdomen is soft.     Tenderness: There is no abdominal tenderness. There is no guarding or rebound.  Genitourinary:    Comments: Black stool in rectal vault, nontender Musculoskeletal:        General: No tenderness.     Comments: Cool extremities, left leg greater than right.  Absent left femoral and DP pulse.    Skin:    General: Skin is dry.     Coloration: Skin is pale.  Neurological:     Mental Status: He is alert.     Comments: Moderately confused.  5/5 in all four extremities.  Altered sensation to distal left leg.   Psychiatric:     Comments: Anxious appearing, mildly agitated      ED Treatments / Results  Labs (all labs ordered are listed, but only abnormal results are displayed) Labs Reviewed  COMPREHENSIVE METABOLIC PANEL - Abnormal; Notable for the following components:      Result Value   CO2 15 (*)    Glucose, Bld 128 (*)    BUN 45 (*)    Calcium 8.7 (*)    Total Protein 5.5 (*)    Albumin 3.4 (*)    Alkaline Phosphatase 31 (*)    Anion gap 19 (*)    All other components within normal limits  CBC WITH DIFFERENTIAL/PLATELET - Abnormal; Notable for the following components:   WBC 19.3 (*)    RBC 2.29 (*)    Hemoglobin 6.7 (*)    HCT 21.2 (*)    Neutro Abs 15.4 (*)    Abs Immature Granulocytes 0.15 (*)    All other components  within normal limits  PROTIME-INR - Abnormal; Notable for the following components:  Prothrombin Time 15.6 (*)    INR 1.3 (*)    All other components within normal limits  LACTIC ACID, PLASMA - Abnormal; Notable for the following components:   Lactic Acid, Venous 9.7 (*)    All other components within normal limits  LACTIC ACID, PLASMA - Abnormal; Notable for the following components:   Lactic Acid, Venous 4.9 (*)    All other components within normal limits  CBC - Abnormal; Notable for the following components:   WBC 24.9 (*)    RBC 4.13 (*)    Hemoglobin 12.6 (*)    HCT 35.4 (*)    All other components within normal limits  GLUCOSE, CAPILLARY - Abnormal; Notable for the following components:   Glucose-Capillary 110 (*)    All other components within normal limits  GLUCOSE, CAPILLARY - Abnormal; Notable for the following components:   Glucose-Capillary 128 (*)    All other components within normal limits  I-STAT CHEM 8, ED - Abnormal; Notable for the following components:   BUN 47 (*)    Glucose, Bld 123 (*)    Calcium, Ion 1.11 (*)    TCO2 18 (*)    Hemoglobin 6.1 (*)    HCT 18.0 (*)    All other components within normal limits  POC OCCULT BLOOD, ED - Abnormal; Notable for the following components:   Fecal Occult Bld POSITIVE (*)    All other components within normal limits  TROPONIN I (HIGH SENSITIVITY) - Abnormal; Notable for the following components:   Troponin I (High Sensitivity) 3,542 (*)    All other components within normal limits  TROPONIN I (HIGH SENSITIVITY) - Abnormal; Notable for the following components:   Troponin I (High Sensitivity) 6,462 (*)    All other components within normal limits  SARS CORONAVIRUS 2 BY RT PCR (HOSPITAL ORDER, [facility])  MRSA PCR SCREENING  HIV ANTIBODY (ROUTINE TESTING W REFLEX)  HEMOGLOBIN AND HEMATOCRIT, BLOOD  LACTIC ACID, PLASMA  CBC  BASIC METABOLIC PANEL  PHOSPHORUS  MAGNESIUM  HEMOGLOBIN  AND HEMATOCRIT, BLOOD  HEMOGLOBIN AND HEMATOCRIT, BLOOD  HEMOGLOBIN AND HEMATOCRIT, BLOOD  POC SARS CORONAVIRUS 2 AG -  ED  CBG MONITORING, ED  TYPE AND SCREEN  PREPARE RBC (CROSSMATCH)  PREPARE RBC (CROSSMATCH)  ABO/RH    EKG EKG Interpretation  Date/Time:  Saturday September 27 2019 15:22:25 EST Ventricular Rate:  103 PR Interval:    QRS Duration: 102 QT Interval:  379 QTC Calculation: 497 R Axis:   69 Text Interpretation: Sinus tachycardia Right atrial enlargement LVH with secondary repolarization abnormality ST depr, consider ischemia, inferior leads Borderline prolonged QT interval increased rate and ST depression new compared to prior Confirmed by Quintella Reichert [phone]) on 09/27/2019 3:33:56 PM   Radiology Ct Angio Aortobifemoral W And/or Wo Contrast  Result Date: 09/27/2019 CLINICAL DATA:  Left leg pain and absent left femoral pulses. EXAM: CT ANGIOGRAPHY AOBIFEM WITH CONTRAST TECHNIQUE: Abdomen, pelvis, and bilateral lower extremity CTA was performed after bolus administration of iodinated contrast. Noncontrast phase was not acquired. CONTRAST:  139mL OMNIPAQUE IOHEXOL 350 MG/ML SOLN COMPARISON:  None. FINDINGS: Aorta: Extensive atheromatous wall thickening with both calcified and noncalcified plaque. No dissection or aneurysm. Celiac: Moderate narrowing at the origin. Moderate narrowing the origin and just before the splenic common hepatic bifurcation. No acute finding. SMA: Motion artifact at the level of the proximal SMA. There is atherosclerosis without flow limiting stenosis or proximal occlusion. Renal: Severe right renal artery stenosis with  underfilling compared to the left. Proximal left renal artery stenosis is also high-grade, not measurable due to small vessel size and degree of narrowing. IMA: Patent with stenosis and atherosclerosis the origin. 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 The iliacs are heavily calcified. Faint reconstitution seen by the level of the common femoral artery where there is bulky calcified plaque. The superficial femoral artery on the left is occluded proximally with minimal thready flow seen at the midportion in the none seen at the level of the lower SFA. Branches of the profundus femoris do enhance. Muscular branches reconstitute the popliteal artery from the lateral geniculate branches and there is patent although diffusely significantly attenuated lower popliteal artery and upper calf vessels. Faint posterior tibial artery is seen to the level of the ankle but not at the level of the foot. 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 There is diffuse narrowing of the external iliac artery with high-grade narrowing at the right common femoral artery. Severe and diffuse proximal right SFA stenosis. The profunda femoris is patent, as is the distal SFA although severely stenotic. Heavily diseased popliteal artery. The tibioperoneal trunk is patent with patent posterior tibial and anterior tibial artery to the level of the ankle. Lower chest: Motion artifact. Circumferential atheromatous wall thickening of the aorta. Hepatobiliary: No focal liver abnormality.No evidence of biliary obstruction or stone. Pancreas: Unremarkable. Spleen: Small gland without acute abnormality. Adrenals/Urinary Tract: Negative adrenals. No hydronephrosis or stone. Relative, smooth right renal atrophy attributed to severe arterial stenosis. Unremarkable bladder. Stomach/Bowel: No obstruction. No evidence of bowel inflammation or ischemia. Vascular/Lymphatic: Vascular findings below no mass or adenopathy. Reproductive:No pathologic findings. Other: No ascites or pneumoperitoneum. 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 Subchondral collapse in the  lateral femoral condyle on the left. Bilateral hip replacement. Diffuse osteopenia. Motion artifact. Review of the MIP images confirms the above findings. IMPRESSION: 1. On the symptomatic left side there is an occluded common iliac artery which appears chronic as there is collapse of the downstream vessels. Most of the left SFA is chronically occluded with well-formed profunda collaterals reconstituting the severely attenuated and heavily diseased popliteal artery. Severely attenuated left calf vessels, the posterior tibial artery makes it most distal into the calf but is no longer enhancing by the level of the foot. 2. Right lower extremity PVD is also severe with diffusely and significantly attenuated SFA and popliteal artery. There is single vessel runoff into the right foot. 3. High-grade bilateral proximal renal artery stenosis, worse on the right where there is downstream underfilling and right renal atrophy. 4. Motion degraded. Electronically Signed   By: Monte Fantasia M.D.   On: 09/27/2019 17:04    Procedures Procedures (including critical care time) CRITICAL CARE Performed by: Quintella Reichert   Total critical care time: 65 minutes  Critical care time was exclusive of separately billable procedures and treating other patients.  Critical care was necessary to treat or prevent imminent or life-threatening deterioration.  Critical care was time spent personally by me on the following activities: development of treatment plan with patient and/or surrogate as well as nursing, discussions with consultants, evaluation of patient's response to treatment, examination of patient, obtaining history from patient or surrogate, ordering and performing treatments and interventions, ordering and review of laboratory studies, ordering and review of radiographic studies, pulse oximetry  and re-evaluation of patient's condition.  Medications Ordered in ED Medications  0.9 %  sodium chloride infusion  (Manually program via Guardrails IV Fluids) (has no administration in time range)  0.9 %  sodium chloride infusion (Manually program via Guardrails IV Fluids) (has no administration in time range)  pantoprazole (PROTONIX) injection 40 mg (40 mg Intravenous Given 09/27/19 2112)  fentaNYL (SUBLIMAZE) injection 50 mcg (has no administration in time range)  aspirin EC tablet 81 mg (has no administration in time range)  acetaminophen (TYLENOL) tablet 650 mg (has no administration in time range)  ondansetron (ZOFRAN) injection 4 mg (has no administration in time range)  0.9 %  sodium chloride infusion (has no administration in time range)  Chlorhexidine Gluconate Cloth 2 % PADS 6 each (6 each Topical Given 09/27/19 2030)  0.9 %  sodium chloride infusion (250 mLs Intravenous Not Given 09/27/19 2114)  norepinephrine (LEVOPHED) 4mg  in 274mL premix infusion (10 mcg/min Intravenous Rate/Dose Verify 09/28/19 0100)  dextrose 5 %-0.9 % sodium chloride infusion ( Intravenous Rate/Dose Verify 09/28/19 0100)  insulin aspart (novoLOG) injection 0-9 Units (1 Units Subcutaneous Given 09/28/19 0040)  MEDLINE mouth rinse (15 mLs Mouth Rinse Given 09/27/19 2350)  fentaNYL (SUBLIMAZE) injection 50 mcg (50 mcg Intravenous Given 09/27/19 1615)  LORazepam (ATIVAN) 2 MG/ML injection (0.5 mg  Given 09/27/19 1548)  LORazepam (ATIVAN) injection 0.5 mg (0.5 mg Intravenous Given 09/27/19 1548)  iohexol (OMNIPAQUE) 350 MG/ML injection 100 mL (100 mLs Intravenous Contrast Given 09/27/19 1542)  desmopressin (DDAVP) 20 mcg in sodium chloride 0.9 % 50 mL IVPB (0 mcg Intravenous Stopped 09/27/19 1750)  calcium chloride 1 g in sodium chloride 0.9 % 100 mL IVPB (0 g Intravenous Stopped 09/27/19 1918)  magnesium sulfate IVPB 2 g 50 mL (0 g Intravenous Stopped 09/27/19 2055)  fentaNYL (SUBLIMAZE) injection 50 mcg (50 mcg Intravenous Given 09/27/19 1728)  fentaNYL (SUBLIMAZE) 100 MCG/2ML injection (  Given 09/27/19 1731)  PHENYLephrine  40 mcg/ml in normal saline Adult IV Push Syringe (100 mcg Intravenous Given 09/27/19 1604)  PHENYLephrine 40 mcg/ml in normal saline Adult IV Push Syringe (100 mcg Intravenous Given 09/27/19 1600)     Initial Impression / Assessment and Plan / ED Course  I have reviewed the triage vital signs and the nursing notes.  Pertinent labs & imaging results that were available during my care of the patient were reviewed by me and considered in my medical decision making (see chart for details).        Patient with history of vascular disease here for evaluation of three days of distal left leg pain. He is very ill appearing on ED arrival with absent pulses in the left lower extremity. Initial concern for acute limb ischemia and Dr. Donnetta Hutching with vascular surgery consulted. On further assessment patient has ISAT hemoglobin of 6.1. Patient does report having multiple stools for the last couple of weeks. He denies any melanotic or bloody stools. Rectal exam with black stool in the rectal fault, hemoccult positive. He was emergently transfused due to hypotension and poor perfusion. He also required pressers for blood pressure support. Critical care consulted for admission. Following transfusion of PRBCs patient with improved perfusion as well as mental status. Patient and sister updated findings of studies recommendation for admission their agreement with treatment plan.  COIT FILKINS was evaluated in [facility] on 09/28/2019 for the symptoms described in the history of present illness. He was evaluated in the context of the global COVID-19 pandemic, which necessitated consideration that  the patient might be at risk for infection with the SARS-CoV-2 virus that causes COVID-19. Institutional protocols and algorithms that pertain to the evaluation of patients at risk for COVID-19 are in a state of rapid change based on information released by regulatory bodies including the CDC and federal and state  organizations. These policies and algorithms were followed during the patient's care in the ED.   Final Clinical Impressions(s) / ED Diagnoses   Final diagnoses:  Acute GI bleeding  Hemorrhagic shock [facility])    ED Discharge Orders    None       Quintella Reichert, MD 09/28/19 0122

## 2019-09-27 NOTE — ED Triage Notes (Addendum)
Pt arrives via [facility] who states pt is here for left leg numbness with knee pain for 3 days. Pt is A&OX 4. States unable to walk on the leg since yesterday. [facility] states the ""pt has no carotid pulses and states the pt says he normally has no palpable pulses"" Pt states he fell recently and has not slept in 3 days. Pt jaundiced/ashen. *unable to obtain a BP manual or automatic.

## 2019-09-27 NOTE — ED Notes (Addendum)
3 L of lactated ringers given.

## 2019-09-27 NOTE — Consult Note (Signed)
[facility] [place]  Patient name: RAFAY SARE MRN: [id number] DOB: 1961/08/14 Sex: male    HPI: PRANJAL MAYHEW is a 58 y.o. male seen in the emergency room for concern regarding worsening left leg ischemia.  He presented with a several day history of left leg discomfort which was progressive and also feeling poorly in general.  His sister is present in the emergency room to help with history.  After presenting to the emergency room he was taken to the CT scanner and became hemodynamically unstable.  He had a seizure and is currently being resuscitated with inotropes.  He is not intubated currently.  He is unable to communicate.  I had seen the patient on one occasion in our office in July 2019.  He was seen regarding extracranial cerebrovascular occlusive disease.  In reviewing my history and physical from that time he had documented absent left femoral pulse and palpable right femoral pulse.  Past Medical History:  Diagnosis Date  . Carotid stenosis    [place] 0000000:  R [id number]; LICA occluded; prox L subcl occluded; R subcl > 50%  . COPD (chronic obstructive pulmonary disease) ([place])   . History of stroke    Echo 7/16:  EF 60-65  . Hyperlipidemia   . Pituitary tumor    s/p excision ~ 2000  . Stenosis of right subclavian artery ([place])    S/P STENT FOLLOWED BY DR. Leonie Man AND DR. [name]   . TIA (transient ischemic attack)     Family History  Problem Relation Age of Onset  . Hypertension Mother   . CAD Mother   . Congestive Heart Failure Mother   . Atrial fibrillation Mother   . Stroke Father   . Hypertension Father   . CVA Father   . CAD Father        s/p CABG  . Prostate cancer Father   . Colon cancer Sister 21  . Colon polyps Neg Hx   . Liver cancer Neg Hx     SOCIAL HISTORY: Social History   Tobacco Use  . Smoking status: Former Smoker    Packs/day: 0.50    Quit date: 01/22/2014    Years since quitting: 5.6  .  Smokeless tobacco: Never Used  Substance Use Topics  . Alcohol use: No    No Known Allergies  Current Facility-Administered Medications  Medication Dose Route Frequency Provider Last Rate Last Dose  . 0.9 %  sodium chloride infusion (Manually program via Guardrails IV Fluids)   Intravenous Once Quintella Reichert, MD      . 0.9 %  sodium chloride infusion (Manually program via Guardrails IV Fluids)   Intravenous Once Quintella Reichert, MD      . desmopressin (DDAVP) 20 mcg in sodium chloride 0.9 % 50 mL IVPB  20 mcg Intravenous Once Quintella Reichert, MD      . fentaNYL (SUBLIMAZE) injection 50 mcg  50 mcg Intravenous Once Quintella Reichert, MD      . LORazepam (ATIVAN) injection 0.5 mg  0.5 mg Intravenous Once Quintella Reichert, MD      . norepinephrine (LEVOPHED) 4mg  in 230mL premix infusion  0-40 mcg/min Intravenous Continuous Quintella Reichert, MD 18.75 mL/hr at 09/27/19 1607 5 mcg/min at 09/27/19 1607   Current Outpatient  Medications  Medication Sig Dispense Refill  . aspirin EC 81 MG tablet Take 1 tablet (81 mg total) by mouth daily. 90 tablet 3  . clopidogrel (PLAVIX) 75 MG tablet Take 75 mg by mouth daily.  2  . ezetimibe (ZETIA) 10 MG tablet Take 10 mg by mouth daily.     [name] ibuprofen (ADVIL) 200 MG tablet Take 600 mg by mouth every 6 (six) hours as needed for moderate pain.    . rosuvastatin (CRESTOR) 20 MG tablet Take 20 mg by mouth daily.      REVIEW OF SYSTEMS:  [X]  denotes positive finding, [ ]  denotes negative finding Cardiac  Comments:  Chest pain or chest pressure:    Shortness of breath upon exertion:    Short of breath when lying flat:    Irregular heart rhythm:        Vascular    Pain in calf, thigh, or hip brought on by ambulation: x   Pain in feet at night that wakes you up from your sleep:     Blood clot in your veins:    Leg swelling:           PHYSICAL EXAM: Vitals:   09/27/19 1500 09/27/19 1524 09/27/19 1525 09/27/19 1527  BP:  (!) 75/49 (!) 77/62 95/75   Pulse:  (!) 103  95  Resp:  (!) 21 (!) 27 (!) 24  Temp:      SpO2:  100%  100%  Weight: 65 kg     Height: 5\' 8"  (1.727 m)       GENERAL: The patient is a well-nourished male, critically ill with hypotension.  Being volume resuscitated and also being resuscitated with inotropes.  . The vital signs are documented above. CARDIOVASCULAR: Absent pedal pulses. PULMONARY: There is good air exchange  MUSCULOSKELETAL: There are no major deformities or cyanosis. NEUROLOGIC: No focal weakness or paresthesias are detected. SKIN: There are no ulcers or rashes noted. PSYCHIATRIC: Patient is not able to respond being resuscitated  DATA:  CT angiogram of abdomen pelvis and runoff was reviewed.  Official reading is not available currently.  He does have occlusion of his left common iliac artery with reconstitution of the external iliac artery.  He does have extensive common femoral disease as well.  His superior mesenteric and celiac arteries are widely patent  MEDICAL ISSUES: Chronic left leg ischemia made worsened with hypotension.  He is severely anemic.  Unclear as to what is causing him to be so critically ill.  Could be cardiac or possibly GI bleed.  No role for vascular intervention until he is stabilized.  No evidence of acute change in his chronic lower extremity ischemia    Rosetta Posner, MD FACS [facility] Tel [phone] Pager [phone]

## 2019-09-27 NOTE — Consult Note (Signed)
[facility] Reason for Consult: Melena with severe anemia. Referring Physician: CCM-Dr. Erskine Emery.  Raymond Carney is an 58 y.o. male.  HPI: 58 year male with multiple medical problems listed below including PVD, presented with worsening pain in the left leg and was returning from a CTA when he was assumed to have a seizure and beacme severely hypotensive. He was noted to have a femoral occlusion that was thought to be chronic by the vasular service and therefore no intervention was advised. His hemoglobin dropped from 13 gm/dl 2 months ago to 6 gms/dl today. He has also been having black stools but it is not clear for how long. He is on dual antiplatelet therapy at home along with Ibuprofen. There is no history of hematemesis or hematochezia.     Past Medical History:  Diagnosis Date  . Carotid stenosis    [place] 1694:  R 50-38; LICA occluded; prox L subcl occluded; R subcl > 50%  . COPD (chronic obstructive pulmonary disease) ([place])   . History of stroke    Echo 7/16:  EF 60-65  . Hyperlipidemia   . Pituitary tumor    s/p excision ~ 2000  . Stenosis of right subclavian artery ([place])    S/P STENT FOLLOWED BY DR. Leonie Man AND DR. [name]   . TIA (transient ischemic attack)    Past Surgical History:  Procedure Laterality Date  . bilateral hip replacements    . HIP ARTHROPLASTY Bilateral    ON DISABILITY SECONDARY   . IR ANGIO INTRA EXTRACRAN SEL COM CAROTID INNOMINATE UNI R MOD SED  07/03/2019  . IR ANGIOGRAM EXTREMITY RIGHT  07/03/2019  . OTHER SURGICAL HISTORY  1996   HISTORY OF PITUITARY TUMOR REMOVAL CIRCA  . RADIOLOGY WITH ANESTHESIA N/A 05/26/2015   Procedure: RADIOLOGY WITH ANESTHESIA/ANGIOPLASTY;  Surgeon: Luanne Bras, MD;  Location: [facility];  Service: Radiology;  Laterality: N/A;  . TRANSPHENOIDAL / TRANSNASAL HYPOPHYSECTOMY / RESECTION PITUITARY TUMOR     Family History  Problem Relation Age of Onset  . Hypertension Mother   . CAD Mother   . Congestive Heart Failure  Mother   . Atrial fibrillation Mother   . Stroke Father   . Hypertension Father   . CVA Father   . CAD Father        s/p CABG  . Prostate cancer Father   . Colon cancer Sister 70  . Colon polyps Neg Hx   . Liver cancer Neg Hx    Social History:  reports that he quit smoking about 5 years ago. He smoked 0.50 packs per day. He has never used smokeless tobacco. He reports that he does not drink alcohol or use drugs.  Allergies: No Known Allergies  Medications: I have reviewed the patient's current medications.  Results for orders placed or performed during the hospital encounter of 09/27/19 (from the past 48 hour(s))  SARS Coronavirus 2 by RT PCR (hospital order, performed in [facility] hospital lab) Nasopharyngeal Nasopharyngeal Swab     Status: None   Collection Time: 09/27/19  3:16 PM   Specimen: Nasopharyngeal Swab  Result Value Ref Range   SARS Coronavirus 2 NEGATIVE NEGATIVE    Comment: (NOTE) SARS-CoV-2 target nucleic acids are NOT DETECTED. The SARS-CoV-2 RNA is generally detectable in upper and lower respiratory specimens during the acute phase of infection. The lowest concentration of SARS-CoV-2 viral copies this assay can detect is 250 copies / mL. A negative result does not preclude SARS-CoV-2 infection and should  not be used as the sole basis for treatment or other patient management decisions.  A negative result may occur with improper specimen collection / handling, submission of specimen other than nasopharyngeal swab, presence of viral mutation(s) within the areas targeted by this assay, and inadequate number of viral copies (<250 copies / mL). A negative result must be combined with clinical observations, patient history, and epidemiological information. Fact Sheet for Patients:   StrictlyIdeas.no Fact Sheet for Healthcare Providers: BankingDealers.co.za This test is not yet approved or cleared  by the [place] FDA and has been authorized for detection and/or diagnosis of SARS-CoV-2 by FDA under an Emergency Use Authorization (EUA).  This EUA will remain in effect (meaning this test can be used) for the duration of the COVID-19 declaration under Section 564(b)(1) of the Act, 21 U.S.C. section 360bbb-3(b)(1), unless the authorization is terminated or revoked sooner. Performed at [facility], [place] [address] [place], [place] [address]   Comprehensive metabolic panel     Status: Abnormal   Collection Time: 09/27/19  3:21 PM  Result Value Ref Range   Sodium 137 135 - 145 mmol/L   Potassium 3.9 3.5 - 5.1 mmol/L   Chloride 103 98 - 111 mmol/L   CO2 15 (L) 22 - 32 mmol/L   Glucose, Bld 128 (H) 70 - 99 mg/dL   BUN 45 (H) 6 - 20 mg/dL   Creatinine, Ser 1.15 0.61 - 1.24 mg/dL   Calcium 8.7 (L) 8.9 - 10.3 mg/dL   Total Protein 5.5 (L) 6.5 - 8.1 g/dL   Albumin 3.4 (L) 3.5 - 5.0 g/dL   AST 19 15 - 41 U/L   ALT 10 0 - 44 U/L   Alkaline Phosphatase 31 (L) 38 - 126 U/L   Total Bilirubin 0.5 0.3 - 1.2 mg/dL   GFR calc non Af Amer >60 >60 mL/min   GFR calc Af Amer >60 >60 mL/min   Anion gap 19 (H) 5 - 15    Comment: Performed at [facility], [place] [address], [place],  [address]  CBC with Differential     Status: Abnormal   Collection Time: 09/27/19  3:21 PM  Result Value Ref Range   WBC 19.3 (H) 4.0 - 10.5 K/uL   RBC 2.29 (L) 4.22 - 5.81 MIL/uL   Hemoglobin 6.7 (LL) 13.0 - 17.0 g/dL    Comment: REPEATED TO VERIFY THIS CRITICAL RESULT HAS VERIFIED AND BEEN CALLED TO LOUIE CHILTON RN. BY TAMEECO CALDWELL ON 11 28 2020 AT 1543, AND HAS BEEN READ BACK.     HCT 21.2 (L) 39.0 - 52.0 %   MCV 92.6 80.0 - 100.0 fL   MCH 29.3 26.0 - 34.0 pg   MCHC 31.6 30.0 - 36.0 g/dL   RDW 13.4 11.5 - 15.5 %   Platelets 259 150 - 400 K/uL   nRBC 0.0 0.0 - 0.2 %   Neutrophils Relative % 79 %   Neutro Abs 15.4 (H) 1.7 - 7.7 K/uL   Lymphocytes Relative 15 %   Lymphs Abs 2.9 0.7 - 4.0 K/uL    Monocytes Relative 5 %   Monocytes Absolute 0.9 0.1 - 1.0 K/uL   Eosinophils Relative 0 %   Eosinophils Absolute 0.0 0.0 - 0.5 K/uL   Basophils Relative 0 %   Basophils Absolute 0.1 0.0 - 0.1 K/uL   Immature Granulocytes 1 %   Abs Immature Granulocytes 0.15 (H) 0.00 - 0.07 K/uL    Comment: Performed  at [facility], [place] [address] [place], [place] [address]  Protime-INR     Status: Abnormal   Collection Time: 09/27/19  3:21 PM  Result Value Ref Range   Prothrombin Time 15.6 (H) 11.4 - 15.2 seconds   INR 1.3 (H) 0.8 - 1.2    Comment: (NOTE) INR goal varies based on device and disease states. Performed at [facility], [place] [address] [place], [place] [address]   Lactic acid, plasma     Status: Abnormal   Collection Time: 09/27/19  3:21 PM  Result Value Ref Range   Lactic Acid, Venous 9.7 (HH) 0.5 - 1.9 mmol/L    Comment: CRITICAL RESULT CALLED TO, READ BACK BY AND VERIFIED WITH: RN L CHILTON AT 1601 09/27/2019 BY L BENFIELD Performed at [facility], [place] [address] [place], [place] [address]   Type and screen [place]     Status: None (Preliminary result)   Collection Time: 09/27/19  3:25 PM  Result Value Ref Range   ABO/RH(D) B POS    Antibody Screen NEG    Sample Expiration      09/30/2019,2359 Performed at [facility], [place] [address] [place], [place] [address]    Unit Number [id number]    Blood Component Type RED CELLS,LR    Unit division 00    Status of Unit ISSUED    Unit tag comment VERBAL ORDERS PER DR REES    Transfusion Status OK TO TRANSFUSE    Crossmatch Result COMPATIBLE    Unit Number [id number]    Blood Component Type RED CELLS,LR    Unit division 00    Status of Unit ISSUED    Unit tag comment VERBAL ORDERS PER DR REES    Transfusion Status OK TO TRANSFUSE    Crossmatch Result COMPATIBLE    Unit Number [id number]    Blood Component Type RED CELLS,LR    Unit division 00    Status of Unit ISSUED     Unit tag comment VERBAL ORDERS PER DR REES    Transfusion Status OK TO TRANSFUSE    Crossmatch Result COMPATIBLE    Unit Number [id number]    Blood Component Type RED CELLS,LR    Unit division 00    Status of Unit ISSUED    Transfusion Status OK TO TRANSFUSE    Crossmatch Result Compatible   ABO/Rh     Status: None   Collection Time: 09/27/19  3:25 PM  Result Value Ref Range   ABO/RH(D)      B POS Performed at [place] [address], [place],  [address]   I-stat chem 8, ED (not at [facility] or [facility])     Status: Abnormal   Collection Time: 09/27/19  3:26 PM  Result Value Ref Range   Sodium 135 135 - 145 mmol/L   Potassium 4.1 3.5 - 5.1 mmol/L   Chloride 103 98 - 111 mmol/L   BUN 47 (H) 6 - 20 mg/dL   Creatinine, Ser 1.00 0.61 - 1.24 mg/dL   Glucose, Bld 123 (H) 70 - 99 mg/dL   Calcium, Ion 1.11 (L) 1.15 - 1.40 mmol/L   TCO2 18 (L) 22 - 32 mmol/L   Hemoglobin 6.1 (LL) 13.0 - 17.0 g/dL   HCT 18.0 (L) 39.0 - 52.0 %   Comment NOTIFIED PHYSICIAN   POC occult blood, ED     Status: Abnormal   Collection Time: 09/27/19  3:34  PM  Result Value Ref Range   Fecal Occult Bld POSITIVE (A) NEGATIVE  Prepare RBC     Status: None   Collection Time: 09/27/19  3:35 PM  Result Value Ref Range   Order Confirmation      ORDER PROCESSED BY BLOOD BANK Performed at [facility], [place] [address] [place], [place] [address]   Prepare RBC     Status: None   Collection Time: 09/27/19  4:00 PM  Result Value Ref Range   Order Confirmation      ORDER PROCESSED BY BLOOD BANK Performed at [facility], [place] [address], [place],  [address]   CBG monitoring, ED     Status: None   Collection Time: 09/27/19  4:09 PM  Result Value Ref Range   Glucose-Capillary 74 70 - 99 mg/dL  POC SARS Coronavirus 2 Ag-ED - Nasal Swab (BD Veritor Kit)     Status: None   Collection Time: 09/27/19  4:33 PM  Result Value Ref Range   SARS Coronavirus 2 Ag NEGATIVE NEGATIVE    Comment:  (NOTE) SARS-CoV-2 antigen NOT DETECTED.  Negative results are presumptive.  Negative results do not preclude SARS-CoV-2 infection and should not be used as the sole basis for treatment or other patient management decisions, including infection  control decisions, particularly in the presence of clinical signs and  symptoms consistent with COVID-19, or in those who have been in contact with the virus.  Negative results must be combined with clinical observations, patient history, and epidemiological information. The expected result is Negative. Fact Sheet for Patients: PodPark.tn Fact Sheet for Healthcare Providers: GiftContent.is This test is not yet approved or cleared by the [place] FDA and  has been authorized for detection and/or diagnosis of SARS-CoV-2 by FDA under an Emergency Use Authorization (EUA).  This EUA will remain in effect (meaning this test can be used) for the duration of  the COVID-19 de claration under Section 564(b)(1) of the Act, 21 U.S.C. section 360bbb-3(b)(1), unless the authorization is terminated or revoked sooner.     Ct Angio Aortobifemoral W And/or Wo Contrast  Result Date: 09/27/2019 CLINICAL DATA:  Left leg pain and absent left femoral pulses. EXAM: CT ANGIOGRAPHY AOBIFEM WITH CONTRAST TECHNIQUE: Abdomen, pelvis, and bilateral lower extremity CTA was performed after bolus administration of iodinated contrast. Noncontrast phase was not acquired. CONTRAST:  156m OMNIPAQUE IOHEXOL 350 MG/ML SOLN COMPARISON:  None. FINDINGS: Aorta: Extensive atheromatous wall thickening with both calcified and noncalcified plaque. No dissection or aneurysm. Celiac: Moderate narrowing at the origin. Moderate narrowing the origin and just before the splenic common hepatic bifurcation. No acute finding. SMA: Motion artifact at the level of the proximal SMA. There is atherosclerosis without flow limiting stenosis or  proximal occlusion. Renal: Severe right renal artery stenosis with underfilling compared to the left. Proximal left renal artery stenosis is also high-grade, not measurable due to small vessel size and degree of narrowing. IMA: Patent with stenosis and atherosclerosis the origin. Left iliacs and lower extremity: Occluded left common iliac artery at the origin with downstream underfilling suggesting this is chronic. The iliacs are heavily calcified. Faint reconstitution seen by the level of the common femoral artery where there is bulky calcified plaque. The superficial femoral artery on the left is occluded proximally with minimal thready flow seen at the midportion in the none seen at the level of the lower SFA. Branches of the profundus femoris do enhance. Muscular branches reconstitute the popliteal artery from  the lateral geniculate branches and there is patent although diffusely significantly attenuated lower popliteal artery and upper calf vessels. Faint posterior tibial artery is seen to the level of the ankle but not at the level of the foot. 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 There is diffuse narrowing of the external iliac artery with high-grade narrowing at the right common femoral artery. Severe and diffuse proximal right SFA stenosis. The profunda femoris is patent, as is the distal SFA although severely stenotic. Heavily diseased popliteal artery. The tibioperoneal trunk is patent with patent posterior tibial and anterior tibial artery to the level of the ankle. Lower chest: Motion artifact. Circumferential atheromatous wall thickening of the aorta. Hepatobiliary: No focal liver abnormality.No evidence of biliary obstruction or stone. Pancreas: Unremarkable. Spleen: Small gland without acute abnormality. Adrenals/Urinary Tract: Negative adrenals. No hydronephrosis or stone. Relative, smooth right renal atrophy attributed to  severe arterial stenosis. Unremarkable bladder. Stomach/Bowel: No obstruction. No evidence of bowel inflammation or ischemia. Vascular/Lymphatic: Vascular findings below no mass or adenopathy. Reproductive:No pathologic findings. Other: No ascites or pneumoperitoneum. 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 Subchondral collapse in the lateral femoral condyle on the left. Bilateral hip replacement. Diffuse osteopenia. Motion artifact. Review of the MIP images confirms the above findings. IMPRESSION: 1. On the symptomatic left side there is an occluded common iliac artery which appears chronic as there is collapse of the downstream vessels. Most of the left SFA is chronically occluded with well-formed profunda collaterals reconstituting the severely attenuated and heavily diseased popliteal artery. Severely attenuated left calf vessels, the posterior tibial artery makes it most distal into the calf but is no longer enhancing by the level of the foot. 2. Right lower extremity PVD is also severe with diffusely and significantly attenuated SFA and popliteal artery. There is single vessel runoff into the right foot. 3. High-grade bilateral proximal renal artery stenosis, worse on the right where there is downstream underfilling and right renal atrophy. 4. Motion degraded. Electronically Signed   By: Monte Fantasia M.D.   On: 09/27/2019 17:04   Review of Systems  Constitutional: Positive for malaise/fatigue. Negative for chills, diaphoresis and weight loss.  HENT: Negative.   Eyes: Negative.   Respiratory: Negative.   Cardiovascular: Negative.   Gastrointestinal: Positive for blood in stool, constipation and melena. Negative for abdominal pain, diarrhea, heartburn, nausea and vomiting.  Genitourinary: Negative.   Musculoskeletal: Positive for joint pain.  Skin: Negative.   Neurological: Positive for focal weakness, seizures and weakness. Negative for  dizziness, tingling, tremors and headaches.  Endo/Heme/Allergies: Negative.   Psychiatric/Behavioral: Negative.  Negative for substance abuse.   Blood pressure (!) 111/100, pulse 92, temperature 98.5 F (36.9 C), resp. rate (!) 23, height _0  (1.727 m), weight 65 kg, SpO2 94 %. Physical Exam  Constitutional: He is cooperative. He appears ill. No distress.  HENT:  Head: Normocephalic and atraumatic.  Neck: Trachea normal.  Cardiovascular: An irregular rhythm present.  Respiratory: Effort normal and breath sounds normal.  GI: Soft. Normal appearance and bowel sounds are normal.   Assessment/Plan: 1) GI bleed with melenic stools and drop in hemoglobin from 13 g/dL to 6 g/dL today. We will plan endoscopy when the patient is hemodynamically stable continue supportive care for now 2) COPD. 3) History of a pituitary tumor status post resection. 4) Carotid artery stenosis on the left side. 5) right subclavian stenosis status post angioplasty  completely occluded subleft subclavian. 6) Peripheral vascular disease. 7) Hyperlipidemia. Juanita Craver 09/27/2019, 5:58 PM

## 2019-09-27 NOTE — ED Notes (Signed)
Patient transported to CT accompanied by RN and ED tech

## 2019-09-27 NOTE — Progress Notes (Addendum)
ANTICOAGULATION CONSULT NOTE   Pharmacy Consult for Heparin  Indication: Ischemic Limb  No Known Allergies  Patient Measurements: Height: 5\' 8"  (172.7 cm) Weight: 143 lb 4.8 oz (65 kg) IBW/kg (Calculated) : 68.4 Heparin Dosing Weight: 65 kg  Vital Signs: Temp: 98.4 F (36.9 C) (11/28 1420) BP: 93/50 (11/28 1438) Pulse Rate: 94 (11/28 1448)  Labs: No results for input(s): HGB, HCT, PLT, APTT, LABPROT, INR, HEPARINUNFRC, HEPRLOWMOCWT, CREATININE, CKTOTAL, CKMB, TROPONINIHS in the last 72 hours.  CrCl cannot be calculated (Patient's most recent lab result is older than the maximum 21 days allowed.).   Medical History: Past Medical History:  Diagnosis Date  . Carotid stenosis    [place] 0000000:  R [id number]; LICA occluded; prox L subcl occluded; R subcl > 50%  . COPD (chronic obstructive pulmonary disease) ([place])   . History of stroke    Echo 7/16:  EF 60-65  . Hyperlipidemia   . Pituitary tumor    s/p excision ~ 2000  . Stenosis of right subclavian artery ([place])    S/P STENT FOLLOWED BY DR. Leonie Man AND DR. [name]   . TIA (transient ischemic attack)     Medications:  Scheduled:  . fentaNYL (SUBLIMAZE) injection  50 mcg Intravenous Once  . heparin  4,500 Units Intravenous Once    Assessment: Patient is a 74 yom that presents to the ED with complaints of leg pain. The patient is being anticoagulated for an ischemic limb.  Goal of Therapy:  Heparin level 0.3-0.7 units/ml Monitor platelets by anticoagulation protocol: Yes   Plan:  - Hgb found to be 6.1 at this time will hold heparin if workup continues and patient does need heparin would start at doses below.  - Heparin drip @ 1150 units/hr  - Will check heparin level in 6 hours  - Monitor for s/s of bleeding and CBC daily while on heparin   Duanne Limerick PharmD. BCPS  09/27/2019,3:18 PM

## 2019-09-27 NOTE — ED Notes (Signed)
Notified patient's sister Santiago Glad of IP room assignment

## 2019-09-27 NOTE — Progress Notes (Addendum)
eLink Physician-Brief Progress Note Patient Name: Raymond Carney DOB: 1961/08/13 MRN: [id number]   Date of Service  09/27/2019  HPI/Events of Note  Elevated troponin 3542. Hg stable. LA normalized.  S/p Shock. Hg stable. P afib. Rate fine. EF 45%.  PAD-severe. On asa  S/p 4 units prbc for hemorrhagic shock.   Camera: He feels fine. No pain. Stable VS.   On protonix.   eICU Interventions  - get ECG stat. For NSTEMI?.  - follow troponin at mid night. - due to his hemorrhage low Hg/melena not a candidate for heparin gtt at this time.  - Cardiology consultation in AM, unless STEMI. Will notify cardiology fellow.      Intervention Category Intermediate Interventions: Other:  Elmer Sow 09/27/2019, 11:06 PM   Discussed with cardiology fellow on call. He thinks it could be demand ischemia with ST depression. Trend troponin . He will see him. If trending down just watch. Once stable he might need LHC. High risk for CAD and collaterals.   23:44 EKG: resolution of ischemic changes from noon EKG. So most likely demand ischemia.   01:21 Troponin doubled again, even though EKG ok. Follow another troponin at 3 AM,  Discussed again with fellow. No heparin gtt for now. Get GI consultation for scope if no bleeding, consider heparin/LHC.  Events notified to bed side team, PA; Mickel Baas

## 2019-09-27 NOTE — Progress Notes (Signed)
eLink Physician-Brief Progress Note Patient Name: Raymond Carney DOB: [birth year] MRN: [id number]   Date of Service  09/27/2019  HPI/Events of Note  Asking for q4 ssi  eICU Interventions  SSI Lispro q4 ordered. BG < 120.      Intervention Category Intermediate Interventions: Hyperglycemia - evaluation and treatment  Elmer Sow 09/27/2019, 9:35 PM

## 2019-09-28 ENCOUNTER — Inpatient Hospital Stay (HOSPITAL_COMMUNITY): Payer: Medicare Other

## 2019-09-28 ENCOUNTER — Encounter (HOSPITAL_COMMUNITY): Payer: Self-pay

## 2019-09-28 ENCOUNTER — Encounter (HOSPITAL_COMMUNITY)
Admission: EM | Disposition: A | Discharge status: Home / Self Care | Payer: Self-pay | Source: Home / Self Care | Attending: Internal Medicine

## 2019-09-28 ENCOUNTER — Encounter (HOSPITAL_COMMUNITY): Payer: Self-pay | Admitting: Anesthesiology

## 2019-09-28 ENCOUNTER — Encounter (HOSPITAL_COMMUNITY): Payer: Self-pay | Admitting: Certified Registered Nurse Anesthetist

## 2019-09-28 DIAGNOSIS — K922 Gastrointestinal hemorrhage, unspecified: Secondary | ICD-10-CM | POA: Diagnosis not present

## 2019-09-28 DIAGNOSIS — R578 Other shock: Secondary | ICD-10-CM | POA: Diagnosis not present

## 2019-09-28 DIAGNOSIS — R9431 Abnormal electrocardiogram [ECG] [EKG]: Secondary | ICD-10-CM

## 2019-09-28 DIAGNOSIS — I248 Other forms of acute ischemic heart disease: Secondary | ICD-10-CM | POA: Diagnosis not present

## 2019-09-28 DIAGNOSIS — I739 Peripheral vascular disease, unspecified: Secondary | ICD-10-CM | POA: Diagnosis not present

## 2019-09-28 DIAGNOSIS — I214 Non-ST elevation (NSTEMI) myocardial infarction: Secondary | ICD-10-CM | POA: Diagnosis not present

## 2019-09-28 LAB — PROCALCITONIN: Procalcitonin: <0.1 ng/mL

## 2019-09-28 LAB — TYPE AND SCREEN
ABO/RH(D): B POS
Antibody Screen: NEGATIVE
Unit division: 0
Unit division: 0
Unit division: 0
Unit division: 0

## 2019-09-28 LAB — BPAM RBC
Blood Product Expiration Date: 202012172359
Blood Product Expiration Date: 202012172359
Blood Product Expiration Date: 202012182359
Blood Product Expiration Date: 202012212359
ISSUE DATE / TIME: 202011281530
ISSUE DATE / TIME: 202011281556
ISSUE DATE / TIME: 202011281556
ISSUE DATE / TIME: 202011281621
Unit Type and Rh: 5100
Unit Type and Rh: 7300
Unit Type and Rh: 7300
Unit Type and Rh: 7300

## 2019-09-28 LAB — CBC
HCT: 33.5 % — ABNORMAL LOW (ref 39.0–52.0)
Hemoglobin: 11.7 g/dL — ABNORMAL LOW (ref 13.0–17.0)
MCH: 30.2 pg (ref 26.0–34.0)
MCHC: 34.9 g/dL (ref 30.0–36.0)
MCV: 86.6 fL (ref 80.0–100.0)
Platelets: 172 10*3/uL (ref 150–400)
RBC: 3.87 MIL/uL — ABNORMAL LOW (ref 4.22–5.81)
RDW: 13.9 % (ref 11.5–15.5)
WBC: 23.4 10*3/uL — ABNORMAL HIGH (ref 4.0–10.5)
nRBC: 0 % (ref 0.0–0.2)

## 2019-09-28 LAB — BASIC METABOLIC PANEL
Anion gap: 8 (ref 5–15)
BUN: 26 mg/dL — ABNORMAL HIGH (ref 6–20)
CO2: 19 mmol/L — ABNORMAL LOW (ref 22–32)
Calcium: 7.7 mg/dL — ABNORMAL LOW (ref 8.9–10.3)
Chloride: 110 mmol/L (ref 98–111)
Creatinine, Ser: 0.78 mg/dL (ref 0.61–1.24)
GFR calc Af Amer: >60 mL/min (ref >60)
GFR calc non Af Amer: >60 mL/min (ref >60)
Glucose, Bld: 121 mg/dL — ABNORMAL HIGH (ref 70–99)
Potassium: 3.3 mmol/L — ABNORMAL LOW (ref 3.5–5.1)
Sodium: 137 mmol/L (ref 135–145)

## 2019-09-28 LAB — GLUCOSE, CAPILLARY
Glucose-Capillary: 111 mg/dL — ABNORMAL HIGH (ref 70–99)
Glucose-Capillary: 128 mg/dL — ABNORMAL HIGH (ref 70–99)
Glucose-Capillary: 82 mg/dL (ref 70–99)
Glucose-Capillary: 96 mg/dL (ref 70–99)
Glucose-Capillary: 96 mg/dL (ref 70–99)
Glucose-Capillary: 97 mg/dL (ref 70–99)

## 2019-09-28 LAB — MAGNESIUM: Magnesium: 1.9 mg/dL (ref 1.7–2.4)

## 2019-09-28 LAB — ECHOCARDIOGRAM COMPLETE
Height: 68 in
Weight: 2158.74 oz

## 2019-09-28 LAB — PROTIME-INR
INR: 1.2 (ref 0.8–1.2)
Prothrombin Time: 14.6 seconds (ref 11.4–15.2)

## 2019-09-28 LAB — TROPONIN I (HIGH SENSITIVITY)
Troponin I (High Sensitivity): 6462 ng/L (ref <18)
Troponin I (High Sensitivity): 8672 ng/L (ref <18)
Troponin I (High Sensitivity): 8693 ng/L (ref <18)
Troponin I (High Sensitivity): 9635 ng/L (ref <18)

## 2019-09-28 LAB — PHOSPHORUS: Phosphorus: 2.3 mg/dL — ABNORMAL LOW (ref 2.5–4.6)

## 2019-09-28 LAB — HEMOGLOBIN AND HEMATOCRIT, BLOOD
HCT: 32.7 % — ABNORMAL LOW (ref 39.0–52.0)
HCT: 31.3 % — ABNORMAL LOW (ref 39.0–52.0)
Hemoglobin: 11.5 g/dL — ABNORMAL LOW (ref 13.0–17.0)
Hemoglobin: 11.1 g/dL — ABNORMAL LOW (ref 13.0–17.0)

## 2019-09-28 SURGERY — EGD (ESOPHAGOGASTRODUODENOSCOPY)
Anesthesia: Monitor Anesthesia Care

## 2019-09-28 SURGERY — EGD (ESOPHAGOGASTRODUODENOSCOPY)
Anesthesia: Choice

## 2019-09-28 MED ORDER — LACTATED RINGERS IV BOLUS
500.0000 mL | Freq: Once | INTRAVENOUS | Status: AC
  Administered 2019-09-28: 500 mL via INTRAVENOUS

## 2019-09-28 MED ORDER — POTASSIUM CHLORIDE 10 MEQ/100ML IV SOLN
10.0000 meq | INTRAVENOUS | Status: AC
  Administered 2019-09-28 (×2): 10 meq via INTRAVENOUS
  Filled 2019-09-28 (×2): qty 100

## 2019-09-28 MED ORDER — MAGNESIUM SULFATE IN D5W 1-5 GM/100ML-% IV SOLN
1.0000 g | Freq: Once | INTRAVENOUS | Status: AC
  Administered 2019-09-28: 1 g via INTRAVENOUS
  Filled 2019-09-28: qty 100

## 2019-09-28 MED ORDER — POTASSIUM CHLORIDE 10 MEQ/100ML IV SOLN
10.0000 meq | INTRAVENOUS | Status: AC
  Administered 2019-09-28 (×3): 10 meq via INTRAVENOUS
  Filled 2019-09-28 (×3): qty 100

## 2019-09-28 NOTE — Progress Notes (Signed)
Cardiology Rounding Note   Subjective:    58 year old man with history of severe peripheral vascular disease (no previous w/u for CAD) presented with worsening left leg pain.  Left leg noted to be pale and cold to touch, sent for CTA and evaluated by vascular who feel this is a more chronic femoral occlusion that does not need immediate attention.  On way back from CT, patient had seizure like episode and became severely hypotensive.  H/H noted to be 6 g/dL from 13 two months ago.   ECG showed NSR with severe diffuse ST depression with isolated ST elevation in aVR consistent with global ischemia. hsTrop 9635 -> 8,693. ECG abnormalities resolved with hemodynamic support and transfusion. Denies assiciated CP.   Reports taking DAPT and ibuprofen with some melena 4 days ago but he minimizes this. Got 4u RBCS overnight. Says he feels good now. BP improved. Now on NE at 2. No further bleeding. LLE feeling better.    Objective:   Weight Range:  Vital Signs:   Temp:  [97 F (36.1 C)-99.7 F (37.6 C)] 99 F (37.2 C) (11/29 1100) Pulse Rate:  [55-103] 67 (11/29 1100) Resp:  [10-27] 14 (11/29 1100) BP: (75-122)/(35-100) 98/75 (11/29 1000) SpO2:  [83 %-100 %] 100 % (11/29 1100) Arterial Line BP: (81-125)/(60-95) 98/66 (11/29 1100) Weight:  [61.2 kg-65 kg] 61.2 kg (11/29 0500)    Weight change: Filed Weights   09/27/19 1500 09/28/19 0500  Weight: 65 kg 61.2 kg    Intake/Output:   Intake/Output Summary (Last 24 hours) at 09/28/2019 1125 Last data filed at 09/28/2019 1100 Gross per 24 hour  Intake 2456.9 ml  Output 2150 ml  Net 306.9 ml     Physical Exam: General:  Sitting up in bed. No resp difficulty HEENT: normal Neck: supple. JVP flat . Carotids 2+ bilat; no bruits. No lymphadenopathy or thryomegaly appreciated. Cor: PMI nondisplaced. Regular rate & rhythm. No rubs, gallops or murmurs. Lungs: clear Abdomen: soft, nontender, nondistended. No hepatosplenomegaly. No bruits  or masses. Good bowel sounds. Extremities: no cyanosis, clubbing, rash, edema no palpable pulses on left but no ischemic changes Neuro: alert & orientedx3, cranial nerves grossly intact. moves all 4 extremities w/o difficulty. Affect pleasant  Telemetry: NSR 70s Personally reviewed   Labs: Basic Metabolic Panel: Recent Labs  Lab 09/27/19 1521 09/27/19 1526 09/28/19 0324  NA 137 135 137  K 3.9 4.1 3.3*  CL 103 103 110  CO2 15*  --  19*  GLUCOSE 128* 123* 121*  BUN 45* 47* 26*  CREATININE 1.15 1.00 0.78  CALCIUM 8.7*  --  7.7*  MG  --   --  1.9  PHOS  --   --  2.3*    Liver Function Tests: Recent Labs  Lab 09/27/19 1521  AST 19  ALT 10  ALKPHOS 31*  BILITOT 0.5  PROT 5.5*  ALBUMIN 3.4*   No results for input(s): LIPASE, AMYLASE in the last 168 hours. No results for input(s): AMMONIA in the last 168 hours.  CBC: Recent Labs  Lab 09/27/19 1521 09/27/19 1526 09/27/19 1818 09/27/19 2126 09/28/19 0324  WBC 19.3*  --   --  24.9* 23.4*  NEUTROABS 15.4*  --   --   --   --   HGB 6.7* 6.1* 13.7 12.6* 11.7*  HCT 21.2* 18.0* 40.6 35.4* 33.5*  MCV 92.6  --   --  85.7 86.6  PLT 259  --   --  167 172  Cardiac Enzymes: No results for input(s): CKTOTAL, CKMB, CKMBINDEX, TROPONINI in the last 168 hours.  BNP: BNP (last 3 results) No results for input(s): BNP in the last 8760 hours.  ProBNP (last 3 results) No results for input(s): PROBNP in the last 8760 hours.    Other results:  Imaging: Ct Angio Aortobifemoral W And/or Wo Contrast  Result Date: 09/27/2019 CLINICAL DATA:  Left leg pain and absent left femoral pulses. EXAM: CT ANGIOGRAPHY AOBIFEM WITH CONTRAST TECHNIQUE: Abdomen, pelvis, and bilateral lower extremity CTA was performed after bolus administration of iodinated contrast. Noncontrast phase was not acquired. CONTRAST:  157mL OMNIPAQUE IOHEXOL 350 MG/ML SOLN COMPARISON:  None. FINDINGS: Aorta: Extensive atheromatous wall thickening with both calcified  and noncalcified plaque. No dissection or aneurysm. Celiac: Moderate narrowing at the origin. Moderate narrowing the origin and just before the splenic common hepatic bifurcation. No acute finding. SMA: Motion artifact at the level of the proximal SMA. There is atherosclerosis without flow limiting stenosis or proximal occlusion. Renal: Severe right renal artery stenosis with underfilling compared to the left. Proximal left renal artery stenosis is also high-grade, not measurable due to small vessel size and degree of narrowing. IMA: Patent with stenosis and atherosclerosis the origin. Left iliacs and lower extremity: Occluded left common iliac artery at the origin with downstream underfilling suggesting this is chronic. The iliacs are heavily calcified. Faint reconstitution seen by the level of the common femoral artery where there is bulky calcified plaque. The superficial femoral artery on the left is occluded proximally with minimal thready flow seen at the midportion in the none seen at the level of the lower SFA. Branches of the profundus femoris do enhance. Muscular branches reconstitute the popliteal artery from the lateral geniculate branches and there is patent although diffusely significantly attenuated lower popliteal artery and upper calf vessels. Faint posterior tibial artery is seen to the level of the ankle but not at the level of the foot. 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 There is diffuse narrowing of the external iliac artery with high-grade narrowing at the right common femoral artery. Severe and diffuse proximal right SFA stenosis. The profunda femoris is patent, as is the distal SFA although severely stenotic. Heavily diseased popliteal artery. The tibioperoneal trunk is patent with patent posterior tibial and anterior tibial artery to the level of the ankle. Lower chest: Motion artifact. Circumferential  atheromatous wall thickening of the aorta. Hepatobiliary: No focal liver abnormality.No evidence of biliary obstruction or stone. Pancreas: Unremarkable. Spleen: Small gland without acute abnormality. Adrenals/Urinary Tract: Negative adrenals. No hydronephrosis or stone. Relative, smooth right renal atrophy attributed to severe arterial stenosis. Unremarkable bladder. Stomach/Bowel: No obstruction. No evidence of bowel inflammation or ischemia. Vascular/Lymphatic: Vascular findings below no mass or adenopathy. Reproductive:No pathologic findings. Other: No ascites or pneumoperitoneum. 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 Subchondral collapse in the lateral femoral condyle on the left. Bilateral hip replacement. Diffuse osteopenia. Motion artifact. Review of the MIP images confirms the above findings. IMPRESSION: 1. On the symptomatic left side there is an occluded common iliac artery which appears chronic as there is collapse of the downstream vessels. Most of the left SFA is chronically occluded with well-formed profunda collaterals reconstituting the severely attenuated and heavily diseased popliteal artery. Severely attenuated left calf vessels, the posterior tibial artery makes it most distal into the calf but is no longer enhancing by the level of the  foot. 2. Right lower extremity PVD is also severe with diffusely and significantly attenuated SFA and popliteal artery. There is single vessel runoff into the right foot. 3. High-grade bilateral proximal renal artery stenosis, worse on the right where there is downstream underfilling and right renal atrophy. 4. Motion degraded. Electronically Signed   By: Monte Fantasia M.D.   On: 09/27/2019 17:04      Medications:     Scheduled Medications: . sodium chloride   Intravenous Once  . sodium chloride   Intravenous Once  . Chlorhexidine Gluconate Cloth  6 each Topical Daily  . insulin aspart   0-9 Units Subcutaneous Q4H  . mouth rinse  15 mL Mouth Rinse BID  . pantoprazole (PROTONIX) IV  40 mg Intravenous Q12H     Infusions: . sodium chloride    . sodium chloride    . dextrose 5 % and 0.9% NaCl 50 mL/hr at 09/28/19 1100  . norepinephrine (LEVOPHED) Adult infusion 4 mcg/min (09/28/19 1100)     PRN Medications:  acetaminophen, fentaNYL (SUBLIMAZE) injection, ondansetron (ZOFRAN) IV   Assessment/Plan:   1. NSTEMI  - likely due to demand ischemia (not ACS) in setting of hemorrhagic shock.  - ECG suggestive of global ischemia. Suspect he may have severe underlying CAD given ECG and CRFs. - unable to give heparin or ASA currently.  - once GI issues resolved will need diagnostic cath - will need statin when taking POs.  - no b-blocker yet with need for pressors.  - echo today viewed personally. EF 50-55% likely mild inferior wall HK  2. Hemorrhagic shock likely due to acute upper GIB - improved after 4u transfusion. On IV PPI - wean NE as tolerated  - serial HCTs - GI following. Probable EGD tomorrow  3. 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 Donnetta Hutching. Will need Fem-Fem bypass when more stable  - says he quit smoking several years ago   4. Hypokalemia - will supp  Plan d/w him and his daughter at bedside.   CRITICAL CARE Performed by: Glori Bickers  Total critical care time: 35 minutes  Critical care time was exclusive of separately billable procedures and treating other patients.  Critical care was necessary to treat or prevent imminent or life-threatening deterioration.  Critical care was time spent personally by me (independent of midlevel providers or residents) on the following activities: development of treatment plan with patient and/or surrogate as well as nursing, discussions with consultants, evaluation of patient's response to treatment, examination of patient, obtaining history from  patient or surrogate, ordering and performing treatments and interventions, ordering and review of laboratory studies, ordering and review of radiographic studies, pulse oximetry and re-evaluation of patient's condition.    Length of Stay: 1   Glori Bickers MD 09/28/2019, 11:25 AM  Advanced Heart Failure Team Pager [phone] (M-F; [place])  Please contact [facility] for night-coverage after hours (4p -7a ) and weekends on [email or web address]

## 2019-09-28 NOTE — Progress Notes (Signed)
CRITICAL VALUE ALERT  Critical Value:  Troponin 3,542  Date & Time Notied:  11/28 2251  Provider Notified: Warren Lacy   Orders Received/Actions taken: Awaiting new orders.   RN will continue to monitor pt closely.

## 2019-09-28 NOTE — Progress Notes (Signed)
  Echocardiogram 2D Echocardiogram has been performed.  Raymond Carney 09/28/2019, 12:02 PM

## 2019-09-28 NOTE — Progress Notes (Signed)
[facility] ADULT ICU REPLACEMENT PROTOCOL FOR AM LAB REPLACEMENT ONLY  The patient does apply for the [facility] Adult ICU Electrolyte Replacment Protocol based on the criteria listed below:   1. Is GFR >/= 40 ml/min? Yes.    Patient's GFR today is >60 2. Is urine output >/= 0.5 ml/kg/hr for the last 6 hours? Yes.   Patient's UOP is 1.41 ml/kg/hr 3. Is BUN < 60 mg/dL? Yes.    Patient's BUN today is 26 4. Abnormal electrolyte(s): K+ 3.3 5. Ordered repletion with: protocol 6. If a panic level lab has been reported, has the CCM MD in charge been notified? Yes.  .   Physician:  Dr. Otilio Jefferson, Talbot Grumbling 09/28/2019 5:37 AM

## 2019-09-28 NOTE — Progress Notes (Signed)
Patient ID: Raymond Carney, male   DOB: [birth year], 58 y.o.   MRN: [id number]  Progress Note    09/28/2019 8:30 AM * No surgery date entered *  Subjective: Looks amazingly good this morning.  Had major GI bleed with anemia.  Also had markedly elevated troponins.  He denies any left leg pain today.   Vitals:   09/28/19 0700 09/28/19 0715  BP: (!) 91/45 97/69  Pulse: 61 67  Resp: 16 15  Temp: 98.6 F (37 C) 98.6 F (37 C)  SpO2: 100% 100%   Physical Exam: Both lower extremities warm with no tenderness in his calf muscles with normal motor and sensory function.  2+ right femoral pulse and absent left femoral pulse  CBC    Component Value Date/Time   WBC 23.4 (H) 09/28/2019 0324   RBC 3.87 (L) 09/28/2019 0324   HGB 11.7 (L) 09/28/2019 0324   HCT 33.5 (L) 09/28/2019 0324   PLT 172 09/28/2019 0324   MCV 86.6 09/28/2019 0324   MCH 30.2 09/28/2019 0324   MCHC 34.9 09/28/2019 0324   RDW 13.9 09/28/2019 0324   LYMPHSABS 2.9 09/27/2019 1521   MONOABS 0.9 09/27/2019 1521   EOSABS 0.0 09/27/2019 1521   BASOSABS 0.1 09/27/2019 1521    BMET    Component Value Date/Time   NA 137 09/28/2019 0324   K 3.3 (L) 09/28/2019 0324   CL 110 09/28/2019 0324   CO2 19 (L) 09/28/2019 0324   GLUCOSE 121 (H) 09/28/2019 0324   BUN 26 (H) 09/28/2019 0324   CREATININE 0.78 09/28/2019 0324   CALCIUM 7.7 (L) 09/28/2019 0324   GFRNONAA >60 09/28/2019 0324   GFRAA >60 09/28/2019 0324    INR    Component Value Date/Time   INR 1.3 (H) 09/27/2019 1521     Intake/Output Summary (Last 24 hours) at 09/28/2019 0830 Last data filed at 09/28/2019 0600 Gross per 24 hour  Intake 1956.94 ml  Output 1200 ml  Net 756.94 ml     Assessment/Plan:  58 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  He became symptomatic with prolapse found anemia.  Would not recommend any further treatment unless he becomes symptomatic.  Will not follow actively.  Please call if  we can assist     Rosetta Posner, MD [facility] [phone] 09/28/2019 8:30 AM

## 2019-09-28 NOTE — Consult Note (Signed)
Cardiology Consult    Patient ID: AMASA MCKECHNIE MRN: [id number], DOB/AGE: [id number]   Admit date: 09/27/2019 Date of Consult: 09/28/2019  Primary Physician: Antony Contras, MD Primary Cardiologist: Candee Furbish, MD Requesting Provider: CCM  Patient Profile    Raymond Carney is a 58 y.o. male with a history of severe peripheral vascular disease (bilateral carotid disease, lower extremity vascular disease) . He presented with L-leg pain felt 2/2 chronic CTA femoral artery by vascular surgery, but then had seizure on way back from CT and found to have hemorrhagic shock from recent GI bleeding. ECG with STD that are now resolved, cardiology is consulted in setting of troponin elevation.     Patient reports no diagnosed cardiac disease. He was referred to cardiology for his peripheral vascular disease and hyperlipidemia, saw a PA in 2019 and was planned to establish care with Dr. Candee Furbish. But he denies any history of CAD, structural cardiac issues, CHF, or arrhythmias.   History of Present Illness    Patient denies any recent cardiac symptoms, no CP, dyspnea, orthopnea, PND, edema, palpitations, syncope, or near syncope, besides seizure episode earlier today. Currently he is on the CCM service s/p 4 units PRBC with improvement of hemoglobin to > 10 from 6.7, remains somewhat hypotensive requiring norepinephrine infusion. But he denies any cardiac symptoms still with no CP, heaviness, or dyspnea.   Review of Systems   [y] = yes, [ ]  = no      General: Weight gain [ ] ; Weight loss [ ] ; Fever [ ]   Cardiac: Chest pain/pressure [ ] ; Resting SOB [ ] ; Exertional SOB [ ] ; Orthopnea [ ] ; Paroxysmal nocturnal dyspnea[ ] ; Lower extremity Edema [ ] ; Palpitations [ ] ; Syncope [ ] ; Presyncope [ ]   Pulmonary: Cough [ ] ; Hemoptysis[ ]   GI: Vomiting[ ] ; Dysphagia[ ] ; Melena[ y]; Hematochezia Blue.Reese ]; Heartburn[ ] ; Diarrhea Blue.Reese ]; BRBPR [ ]     GU: Hematuria[ ] ; Dysuria [ ]   Vascular: Pain in legs with  walking Blue.Reese ]; Non-healing sores [ ]   Neuro: Stroke [ ] ; TIA [ ] ; Vision changes [ ]     Ortho/Skin: back pain [ ] ; Rash [ ]     Heme: Bleeding problems [ y]; Clotting disorders Blue.Reese ]; Anemia [ y]    Endocrine: Diabetes [ ] ; Thyroid dysfunction[ ]   Past Medical History   Past Medical History:  Diagnosis Date   Carotid stenosis    [place] 0000000:  R [id number]; LICA occluded; prox L subcl occluded; R subcl > 50%   COPD (chronic obstructive pulmonary disease) (HCC)    History of stroke    Echo 7/16:  EF 60-65   Hyperlipidemia    Pituitary tumor    s/p excision ~ 2000   Stenosis of right subclavian artery (HCC)    S/P STENT FOLLOWED BY DR. Leonie Man AND DR. [name]    TIA (transient ischemic attack)     Past Surgical History:  Procedure Laterality Date   bilateral hip replacements     HIP ARTHROPLASTY Bilateral    ON DISABILITY SECONDARY    IR ANGIO INTRA EXTRACRAN SEL COM CAROTID INNOMINATE UNI R MOD SED  07/03/2019   IR ANGIOGRAM EXTREMITY RIGHT  07/03/2019   OTHER SURGICAL HISTORY  1996   HISTORY OF PITUITARY TUMOR REMOVAL CIRCA   RADIOLOGY WITH ANESTHESIA N/A 05/26/2015   Procedure: RADIOLOGY WITH ANESTHESIA/ANGIOPLASTY;  Surgeon: Luanne Bras, MD;  Location: [facility];  Service: Radiology;  Laterality: N/A;   TRANSPHENOIDAL / TRANSNASAL HYPOPHYSECTOMY /  RESECTION PITUITARY TUMOR       No Known Allergies Inpatient Medications     sodium chloride   Intravenous Once   sodium chloride   Intravenous Once   aspirin EC  81 mg Oral Daily   Chlorhexidine Gluconate Cloth  6 each Topical Daily   insulin aspart  0-9 Units Subcutaneous Q4H   mouth rinse  15 mL Mouth Rinse BID   pantoprazole (PROTONIX) IV  40 mg Intravenous Q12H    Family History    Family History  Problem Relation Age of Onset   Hypertension Mother    CAD Mother    Congestive Heart Failure Mother    Atrial fibrillation Mother    Stroke Father    Hypertension Father    CVA Father    CAD  Father        s/p CABG   Prostate cancer Father    Colon cancer Sister 12   Colon polyps Neg Hx    Liver cancer Neg Hx    He indicated that his mother is alive. He indicated that his father is alive. He indicated that both of his sisters are alive. He indicated that both of his brothers are alive. He indicated that his maternal grandmother is deceased. He indicated that his maternal grandfather is deceased. He indicated that his paternal grandmother is deceased. He indicated that his paternal grandfather is deceased. He indicated that the status of his neg hx is unknown.   Social History    Social History   Socioeconomic History   Marital status: Divorced    Spouse name: Not on file   Number of children: 1   Years of education: Not on file   Highest education level: Not on file  Occupational History   Occupation: Disabled  Scientist, product/process development strain: Not on file   Food insecurity    Worry: Not on file    Inability: Not on file   Transportation needs    Medical: Not on file    Non-medical: Not on file  Tobacco Use   Smoking status: Former Smoker    Packs/day: 0.50    Quit date: 01/22/2014    Years since quitting: 5.6   Smokeless tobacco: Never Used  Substance and Sexual Activity   Alcohol use: No   Drug use: Never   Sexual activity: Not on file  Lifestyle   Physical activity    Days per week: Not on file    Minutes per session: Not on file   Stress: Not on file  Relationships   Social connections    Talks on phone: Not on file    Gets together: Not on file    Attends religious service: Not on file    Active member of club or organization: Not on file    Attends meetings of clubs or organizations: Not on file    Relationship status: Not on file   Intimate partner violence    Fear of current or ex partner: Not on file    Emotionally abused: Not on file    Physically abused: Not on file    Forced sexual activity: Not on file    Other Topics Concern   Not on file  Social History Narrative   Not on file     Physical Exam    Blood pressure 94/68, pulse 70, temperature 98.4 F (36.9 C), resp. rate 19, height 5\' 8"  (1.727 m), weight 65 kg, SpO2 100 %.  Intake/Output Summary (Last 24 hours) at 09/28/2019 0036 Last data filed at 09/28/2019 0000 Gross per 24 hour  Intake 935.04 ml  Output 750 ml  Net 185.04 ml   Wt Readings from Last 3 Encounters:  09/27/19 65 kg  07/03/19 61.2 kg  06/09/19 58.1 kg    General: Alert, NAD, pleasant, appears older than stated age 28: Normal  Neck: No bruits or JVD. Lungs:  Resp regular and unlabored, CTA bilaterally. Heart: Regular rhythm, no s3, s4, or murmurs. Abdomen: Soft, non-tender, non-distended, BS +.  Extremities: Warm. No clubbing, cyanosis or edema. Radials 2+ and equal bilaterally, weak 1+ RLE pulse, LLE pulse difficult to palpate Psych: Normal affect. Neuro: Alert and oriented. No gross focal deficits. No abnormal movements.  Labs    Troponin (Point of Care Test) No results for input(s): TROPIPOC in the last 72 hours. No results for input(s): CKTOTAL, CKMB, TROPONINI in the last 72 hours. Lab Results  Component Value Date   WBC 24.9 (H) 09/27/2019   HGB 12.6 (L) 09/27/2019   HCT 35.4 (L) 09/27/2019   MCV 85.7 09/27/2019   PLT 167 09/27/2019    Recent Labs  Lab 09/27/19 1521 09/27/19 1526  NA 137 135  K 3.9 4.1  CL 103 103  CO2 15*  --   BUN 45* 47*  CREATININE 1.15 1.00  CALCIUM 8.7*  --   PROT 5.5*  --   BILITOT 0.5  --   ALKPHOS 31*  --   ALT 10  --   AST 19  --   GLUCOSE 128* 123*   Lab Results  Component Value Date   CHOL 235 (H) 05/24/2015   HDL 25 (L) 05/24/2015   LDLCALC 193 (H) 05/24/2015   TRIG 87 05/24/2015   No results found for: [facility]   Radiology Studies    Ct Angio Aortobifemoral W And/or Wo Contrast  Result Date: 09/27/2019 CLINICAL DATA:  Left leg pain and absent left femoral pulses. EXAM: CT  ANGIOGRAPHY AOBIFEM WITH CONTRAST TECHNIQUE: Abdomen, pelvis, and bilateral lower extremity CTA was performed after bolus administration of iodinated contrast. Noncontrast phase was not acquired. CONTRAST:  118mL OMNIPAQUE IOHEXOL 350 MG/ML SOLN COMPARISON:  None. FINDINGS: Aorta: Extensive atheromatous wall thickening with both calcified and noncalcified plaque. No dissection or aneurysm. Celiac: Moderate narrowing at the origin. Moderate narrowing the origin and just before the splenic common hepatic bifurcation. No acute finding. SMA: Motion artifact at the level of the proximal SMA. There is atherosclerosis without flow limiting stenosis or proximal occlusion. Renal: Severe right renal artery stenosis with underfilling compared to the left. Proximal left renal artery stenosis is also high-grade, not measurable due to small vessel size and degree of narrowing. IMA: Patent with stenosis and atherosclerosis the origin. Left iliacs and lower extremity: Occluded left common iliac artery at the origin with downstream underfilling suggesting this is chronic. The iliacs are heavily calcified. Faint reconstitution seen by the level of the common femoral artery where there is bulky calcified plaque. The superficial femoral artery on the left is occluded proximally with minimal thready flow seen at the midportion in the none seen at the level of the lower SFA. Branches of the profundus femoris do enhance. Muscular branches reconstitute the popliteal artery from the lateral geniculate branches and there is patent although diffusely significantly attenuated lower popliteal artery and upper calf vessels. Faint posterior tibial artery is seen to the level of the ankle but not at the level of the foot. Right lower extremity:  Extensive atherosclerotic plaque with chronic focal dissection in the common iliac artery. There is diffuse narrowing of the external iliac artery with high-grade narrowing at the right common femoral  artery. Severe and diffuse proximal right SFA stenosis. The profunda femoris is patent, as is the distal SFA although severely stenotic. Heavily diseased popliteal artery. The tibioperoneal trunk is patent with patent posterior tibial and anterior tibial artery to the level of the ankle. Lower chest: Motion artifact. Circumferential atheromatous wall thickening of the aorta. Hepatobiliary: No focal liver abnormality.No evidence of biliary obstruction or stone. Pancreas: Unremarkable. Spleen: Small gland without acute abnormality. Adrenals/Urinary Tract: Negative adrenals. No hydronephrosis or stone. Relative, smooth right renal atrophy attributed to severe arterial stenosis. Unremarkable bladder. Stomach/Bowel: No obstruction. No evidence of bowel inflammation or ischemia. Vascular/Lymphatic: Vascular findings below no mass or adenopathy. Reproductive:No pathologic findings. Other: No ascites or pneumoperitoneum. 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 Subchondral collapse in the lateral femoral condyle on the left. Bilateral hip replacement. Diffuse osteopenia. Motion artifact. Review of the MIP images confirms the above findings. IMPRESSION: 1. On the symptomatic left side there is an occluded common iliac artery which appears chronic as there is collapse of the downstream vessels. Most of the left SFA is chronically occluded with well-formed profunda collaterals reconstituting the severely attenuated and heavily diseased popliteal artery. Severely attenuated left calf vessels, the posterior tibial artery makes it most distal into the calf but is no longer enhancing by the level of the foot. 2. Right lower extremity PVD is also severe with diffusely and significantly attenuated SFA and popliteal artery. There is single vessel runoff into the right foot. 3. High-grade bilateral proximal renal artery stenosis, worse on the right where there is downstream  underfilling and right renal atrophy. 4. Motion degraded. Electronically Signed   By: Monte Fantasia M.D.   On: 09/27/2019 17:04    ECG & Cardiac Imaging   Last TTE - 2016 Left ventricle: The cavity size was normal. Wall thickness was   normal. Systolic function was normal. The estimated ejection   fraction was in the range of 60% to 65%. Wall motion was normal;   there were no regional wall motion abnormalities.  ECG's - first ECG below is initial ECG around 3pm yesterday afternoon when hemoglobin < 7 shows sinus tachycardia with borderline LVH, notable inferior and lateral precordial STD are present, non-specific T wave changes, PVC - repeat ECG from yesterday around 11pm shows resolution of STD and improved T waves, single PVC present, overall significant interval improvement in ischemic changes, QTc is prolonged     - ECG in AM 11/29 shows sinus rhythm with PVC, there is very subtle < 7mm lateral precordial STD probably not much different than the last ECG and still significantly better than his initial ECG when hemoglobin was 6.7   Assessment & Plan    2M w/ severe PAD (bilateral carotid disease, subclavian disease, lower extremity disease), prior CVA, COPD, HLD, pituitary tumor s/p resection, who initially presented with L-leg pain that was ultimately felt 2/2 chronic PAD with no need for emergent intervention, but he then subsequently developed hemorrhagic shock and reported recent tarry stool. He had no cardiac symptoms but did have some STD on ECG around 3pm when hemoglobin was < 7, subsequently ST changes resolved after transfusion. Late in the evening of 11/29 the primary team sent a troponin and it was quite elevated at 3,542 around 930pm so they consulted  cardiology, and repeat was 6,462 around 1130pm. Then at 03am it was [phone], so appears to be nearing plateau. Although he has received > 4 units of blood with resolution of lactic acidosis, he is still requiring levophed  infusion, currently at 10 mcg/min. He has been completely chest pain free this whole time, no dyspnea or other cardiac symptoms.    Overall I suspect Raymond Carney may very well have underlying coronary artery disease given his known severe peripheral vascular disease, however I do not think his troponin elevation and transient STD are due to plaque rupture. Rather I think this is a type II NSTEMI due to demand ischemia (supply demand mismatch) in the setting of his hemorrhagic shock. I would trend his troponin to peak and obtain TTE , also monitor on telemetry and serial ECG's. There is no indication for emergent catheterization given his lack of CP and resolution of ST changes on ECG. Furthermore heparin infusion and antiplatelets are difficult options given his hemorrhagic shock. Therefore, would recommend resumption of his home aspirin when safe from a bleeding perspective, would continue high intensity statin, and continue excellent treatment of his GI hemorrhage with low threshold for transfusion. (Lactate has improved 9.7->1.0 and Hgb 6.7->13.7, but now trending back down to 12->11). Ideally we would also treat with heparin infusion, but obviously there is significant risk of additional bleeding. Would recommend discussion with GI and consideration of heparin when safe from a bleeding perspective.   At some point in the future when bleeding issues are resolved/improved, may be reasonable to pursue ischemic evaluation with catheterization later this hospitalization.   Troponin elevation, transient STD on ECG, no chest pain - suspect 2/2 demand ischemic in setting of hemorrhagic shock, ST changes markedly improved on repeat ECG, has been asymptomatic from cardiac standpoint - also suspect underlying CAD based on known severe PAD and degree of troponin elevation, but low suspicion for acute plaque rupture - trend troponin to peak - serial ECG's and monitor on telemetry - obtain formal TTE in AM -  resume home aspirin when safe from a bleeding perspective - continue high intensity statin - would consider heparin infusion when safe from bleeding/GI perspective - plan for ischemic evaluation later this admission when bleeding issues resolved - additional cardiology recommendations to follow    Signed, Thomasena Edis, MD 09/28/2019, 12:36 AM  For questions or updates, please contact   Please consult [email or web address] for contact info under Cardiology/STEMI.

## 2019-09-28 NOTE — Progress Notes (Addendum)
CROSS COVER [facility] UNASSIGNED PATIENT Subjective: I received a call emergently from the ICU [facility] people about doing an emergent endoscopy for [name] as there was possible need for anticoagulation due to his rising troponin levels.. Patient continues to require  pressure support and has had no further evidence of bleeding since the episode that occurred yesterday on his way back from CT. patient denies any abdominal pain nausea vomiting diarrhea constipation at this time.   Objective: Vital signs in last 24 hours: Temp:  [97 F (36.1 C)-99.7 F (37.6 C)] 99 F (37.2 C) (11/29 1245) Pulse Rate:  [55-103] 70 (11/29 1245) Resp:  [10-27] 15 (11/29 1215) BP: (75-122)/(35-100) 94/66 (11/29 1245) SpO2:  [83 %-100 %] 100 % (11/29 1245) Arterial Line BP: (73-125)/(50-95) 78/54 (11/29 1245) Weight:  [61.2 kg-65 kg] 61.2 kg (11/29 0500) Last BM Date: 09/27/19  Intake/Output from previous day: 11/28 0701 - 11/29 0700 In: 2081 [I.V.:1339; IV Piggyback:742] Out: 1200 [Urine:1200] Intake/Output this shift: Total I/O In: 461.7 [I.V.:257.6; IV Piggyback:204.1] Out: 1200 [Urine:1200]  General appearance: alert, cooperative, appears stated age, fatigued, no distress and pale Resp: clear to auscultation bilaterally Cardio: irregular rate and rhythm, S1, S2 normal, no murmur, click, rub or gallop GI: soft, non-tender; bowel sounds normal; no masses,  no organomegaly Lab Results: Recent Labs    09/27/19 1521  09/27/19 2126 09/28/19 0324 09/28/19 1114  WBC 19.3*  --  24.9* 23.4*  --   HGB 6.7*   < > 12.6* 11.7* 11.5*  HCT 21.2*   < > 35.4* 33.5* 32.7*  PLT 259  --  167 172  --    < > = values in this interval not displayed.   BMET Recent Labs    09/27/19 1521 09/27/19 1526 09/28/19 0324  NA 137 135 137  K 3.9 4.1 3.3*  CL 103 103 110  CO2 15*  --  19*  GLUCOSE 128* 123* 121*  BUN 45* 47* 26*  CREATININE 1.15 1.00 0.78  CALCIUM 8.7*  --  7.7*   LFT Recent Labs     09/27/19 1521  PROT 5.5*  ALBUMIN 3.4*  AST 19  ALT 10  ALKPHOS 31*  BILITOT 0.5   PT/INR Recent Labs    09/27/19 1521 09/28/19 0850  LABPROT 15.6* 14.6  INR 1.3* 1.2   Studies/Results: Ct Angio Aortobifemoral W And/or Wo Contrast  Result Date: 09/27/2019 CLINICAL DATA:  Left leg pain and absent left femoral pulses. EXAM: CT ANGIOGRAPHY AOBIFEM WITH CONTRAST TECHNIQUE: Abdomen, pelvis, and bilateral lower extremity CTA was performed after bolus administration of iodinated contrast. Noncontrast phase was not acquired. CONTRAST:  167mL OMNIPAQUE IOHEXOL 350 MG/ML SOLN COMPARISON:  None. FINDINGS: Aorta: Extensive atheromatous wall thickening with both calcified and noncalcified plaque. No dissection or aneurysm. Celiac: Moderate narrowing at the origin. Moderate narrowing the origin and just before the splenic common hepatic bifurcation. No acute finding. SMA: Motion artifact at the level of the proximal SMA. There is atherosclerosis without flow limiting stenosis or proximal occlusion. Renal: Severe right renal artery stenosis with underfilling compared to the left. Proximal left renal artery stenosis is also high-grade, not measurable due to small vessel size and degree of narrowing. IMA: Patent with stenosis and atherosclerosis the origin. Left iliacs and lower extremity: Occluded left common iliac artery at the origin with downstream underfilling suggesting this is chronic. The iliacs are heavily calcified. Faint reconstitution seen by the level of the common femoral artery where there is bulky calcified plaque. The superficial  femoral artery on the left is occluded proximally with minimal thready flow seen at the midportion in the none seen at the level of the lower SFA. Branches of the profundus femoris do enhance. Muscular branches reconstitute the popliteal artery from the lateral geniculate branches and there is patent although diffusely significantly attenuated lower popliteal artery and  upper calf vessels. Faint posterior tibial artery is seen to the level of the ankle but not at the level of the foot. 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 There is diffuse narrowing of the external iliac artery with high-grade narrowing at the right common femoral artery. Severe and diffuse proximal right SFA stenosis. The profunda femoris is patent, as is the distal SFA although severely stenotic. Heavily diseased popliteal artery. The tibioperoneal trunk is patent with patent posterior tibial and anterior tibial artery to the level of the ankle. Lower chest: Motion artifact. Circumferential atheromatous wall thickening of the aorta. Hepatobiliary: No focal liver abnormality.No evidence of biliary obstruction or stone. Pancreas: Unremarkable. Spleen: Small gland without acute abnormality. Adrenals/Urinary Tract: Negative adrenals. No hydronephrosis or stone. Relative, smooth right renal atrophy attributed to severe arterial stenosis. Unremarkable bladder. Stomach/Bowel: No obstruction. No evidence of bowel inflammation or ischemia. Vascular/Lymphatic: Vascular findings below no mass or adenopathy. Reproductive:No pathologic findings. Other: No ascites or pneumoperitoneum. 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 Subchondral collapse in the lateral femoral condyle on the left. Bilateral hip replacement. Diffuse osteopenia. Motion artifact. Review of the MIP images confirms the above findings. IMPRESSION: 1. On the symptomatic left side there is an occluded common iliac artery which appears chronic as there is collapse of the downstream vessels. Most of the left SFA is chronically occluded with well-formed profunda collaterals reconstituting the severely attenuated and heavily diseased popliteal artery. Severely attenuated left calf vessels, the posterior tibial artery makes it most  distal into the calf but is no longer enhancing by the level of the foot. 2. Right lower extremity PVD is also severe with diffusely and significantly attenuated SFA and popliteal artery. There is single vessel runoff into the right foot. 3. High-grade bilateral proximal renal artery stenosis, worse on the right where there is downstream underfilling and right renal atrophy. 4. Motion degraded. Electronically Signed   By: Monte Fantasia M.D.   On: 09/27/2019 17:04   Medications: I have reviewed the patient's current medications.  Assessment/Plan: 1) Melenic stools with severe anemia with a drop of hemoglobin from 13 to 6 g/dL yesterday. Patient has received 5 units of packed red blood cells and hemoglobin is 11.7 g/dL today.  There has been no further evidence of GI bleeding. Patient has required pressure support and after long discussion with Dr. Valeta Harms. plans are to watch him for today and and do an endoscopy when he is more hemodynamically stable. Dr. [place] Cellar is to see the patient tomorrow make a decision to the EGD once he evaluates the patient.  Fortunately there has been no evidence of ongoing bleeding today. 2) Hemorrhagic shock status post GI bleed as mentioned above-on Levophed. 3) Peripheral vascular disease with chronic occlusion of the left iliac/high-grade bilateral proximal renal artery stenosis worse on the right.[name] 4) Elevated troponins probably secondary to demand ischemia in the setting of hemorrhagic shock.    LOS: 1 day   Juanita Craver 09/28/2019, 12:58 PM

## 2019-09-28 NOTE — Progress Notes (Addendum)
[facility] Progress Note Patient Name: DAERON AARDEMA DOB: 17-Nov-1960 MRN: [id number]   Date of Service  09/28/2019  HPI/Events of Note  Cardiologist called back. Troponin > 8000. St changes mild returning.  NPO No active bleeding.  eICU Interventions  . - called GI doc and asked for evaluation for  Any  active bleeding, if not  to go for heparin/LHC.      Intervention Category Intermediate Interventions: Other:;Infection - evaluation and management  Elmer Sow 09/28/2019, 1:59 AM

## 2019-09-28 NOTE — Progress Notes (Addendum)
NAME:  Raymond Carney, MRN:  [id number], DOB:  06/16/1961, LOS: 1 ADMISSION DATE:  09/27/2019, CONSULTATION DATE:  09/27/19 REFERRING MD:  ER, CHIEF COMPLAINT:  Leg pain   Brief History   58 year old man with history of peripheral vascular disease presenting with worsening left leg pain.  Left leg noted to be pale and cold to touch, sent for CTA and evaluated by vascular who feel this is a more chronic femoral occlusion that does not need immediate attention.  On way back from CT, patient had seizure like episode and became severely hypotensive.  H/H noted to be 6 g/dL from 13 two months ago.  Patient is on ibuprofen, dual antiplatelet therapy at home.  Has been having black stool unclear when started, no hematochezia, hematemesis.  No history of GIB.  No history of seizures.  PCCM asked to admit for possible seizure and acute hemorrhagic shock.  History of present illness   58 year old man with history of peripheral vascular disease presenting with worsening left leg pain.  Left leg noted to be pale and cold to touch, sent for CTA and evaluated by vascular who feel this is a more chronic femoral occlusion that does not need immediate attention.  On way back from CT, patient had seizure like episode and became severely hypotensive.  H/H noted to be 6 g/dL from 13 two months ago.  Patient is on ibuprofen, dual antiplatelet therapy at home.  Has been having black stool unclear when started, no hematochezia, hematemesis.  No history of GIB.  No history of seizures.  PCCM asked to admit for possible seizure and acute hemorrhagic shock.  Past Medical History  Pituitary tumor s/p resection History of ischemic L stroke COPD L carotid stenosis R subclavian stenosis s/p angioplasty Left subclavian completely out [facility] Events   09/27/19- seen in ER, 4 units pRBC given + DDAVP  Consults:  PCCM Vascular GI  Procedures:  R radial arterial line  Significant Diagnostic Tests:   CTA Aorta >> 11/28: Bedside echo- no pericardial effusion, RV decompressed, LVEF looks only slightly down 40-45%  Micro Data:  11/28: COVID >>Negative   Antimicrobials:  None   Interim history/subjective:  No further bleeding over night No seizure activity over night HGB stable after transfusion over night, platelets stable INR is 1.3 No chest pain L Leg is warm with brisk capillary refill , pt states pain is better  Objective   Blood pressure 97/69, pulse 67, temperature 98.6 F (37 C), resp. rate 15, height 5\' 8"  (1.727 m), weight 61.2 kg, SpO2 100 %.        Intake/Output Summary (Last 24 hours) at 09/28/2019 0733 Last data filed at 09/28/2019 0600 Gross per 24 hour  Intake 1956.94 ml  Output 1200 ml  Net 756.94 ml   Filed Weights   09/27/19 1500 09/28/19 0500  Weight: 65 kg 61.2 kg    Examination: GEN: ill appearing man lying in bed, awake and alert, NAD HEENT: NCAT,MMM, no thrush, pale conjunctiva, No LAD CV: S1, S2, Irregular,rate controlled a fib,  No RMG PULM: Bilateral chest excursion, Clear throughout,diminished per bases GI: Soft, +BS, non tender, ND EXT: Legs are warm to touch with brisk capillary refill and + pedal pulses, No obvious deformities NEURO: awake alert and oriented x 3,MAE x 4,  Following commands,  PSYCH: RASS 0  SKIN: Pale, warm and dry, no lesions or rash noted   Resolved Hospital Problem list   NA  Assessment & Plan:   # Seizure like activity in setting of acute on chronic anemia; my suspicion here is more for a syncopal event/ vagal event Given one time dose of Keppra No further seizure activity overnight  Plan No further need for Keppra Continue to monitor Seizure precautions Consider Consult Neuro if any additional seizure like activity   # Shock, hemorrhagic vs. Vasovagal, hx of NSAID use and dual antiplatelets, black stool concerning for PUD.   4 units pRBC given in ER for severe shock and likely syncopal event. No  further stools overnight  Peripheral Levophed started 11/28>> Now at 10 mcg/ min A line placed for accurate BP monitoring Plan GI following  Continue Pepcid Wean Levophed for MAP goal of  > 65 Trend CBC and INR Hold all anticoagulation, anti platelet therapy until EGD done( No plavix or ASA) Discussed with Dr Collene Mares at bedside Transfuse for HGB < 7  Elevated Troponin>> 9635 Most likely demand ischemia in setting of GIB, HGB 5 No Chest pain  Plan Check echo Will not trend trops in absence of angina  Monitor for chest pain  # Intermittent afib/RVR now better after resuscitation Currently rate controlled , with periods of NSR, Rate of 60-70 per tele Mag 1.9 K 3.3 Plan EKG prn Trend Mag and K, ensure Mag > 2 and K > 4 Replete K and Mag this am  # Severe peripheral arterial disease complicating noninvasive determination of blood pressures Chronic femoral occlusion per vascular that does not need immediate attention.  L leg now without pain, with good cap refill, + pedal pulse Arterial line placed for accurate BP monitoring Plan Vascular surgery following>> Discussed with Dr. Donnetta Hutching at bedside Neurovascular checks of LE Q 2,  Fentanyl PRN pain, No need for immediate attention per vascular Follow up with vascular surgery as OP  # Prior CVA with no residual deficits Plan Neuro checks per unit protocol Monitor for changes  # Urinary retention 2/2 increased vagal tone Resolved with insertion of foley cath Plan Continue foley while on pressors Consider removing once off pressors  Plan for 11/29>> Wean Levophed to off as able with MAP Goal of > 65 Replete K and Mag Monitor L leg for vascular changes Monitor for further seizure activity AGD once off pressors unless acute worsening   Best practice:  Diet: NPO Pain/Anxiety/Delirium protocol (if indicated): N/A VAP protocol (if indicated): N/A DVT prophylaxis: SCD to R leg GI prophylaxis: PPI3 Glucose control: SSI  Mobility: BR Code Status: Full Family Communication: No family at bedside Disposition:  ICU  Labs   CBC: Recent Labs  Lab 09/27/19 1521 09/27/19 1526 09/27/19 1818 09/27/19 2126 09/28/19 0324  WBC 19.3*  --   --  24.9* 23.4*  NEUTROABS 15.4*  --   --   --   --   HGB 6.7* 6.1* 13.7 12.6* 11.7*  HCT 21.2* 18.0* 40.6 35.4* 33.5*  MCV 92.6  --   --  85.7 86.6  PLT 259  --   --  167 [id number]    Basic Metabolic Panel: Recent Labs  Lab 09/27/19 1521 09/27/19 1526 09/28/19 0324  NA 137 135 137  K 3.9 4.1 3.3*  CL 103 103 110  CO2 15*  --  19*  GLUCOSE 128* 123* 121*  BUN 45* 47* 26*  CREATININE 1.15 1.00 0.78  CALCIUM 8.7*  --  7.7*  MG  --   --  1.9  PHOS  --   --  2.3*  GFR: Estimated Creatinine Clearance: 85 mL/min (by C-G formula based on SCr of 0.78 mg/dL). Recent Labs  Lab 09/27/19 1521 09/27/19 1818 09/27/19 2126 09/27/19 2139 09/28/19 0214 09/28/19 0324  PROCALCITON  --   --   --   --  <0.10  --   WBC 19.3*  --  24.9*  --   --  23.4*  LATICACIDVEN 9.7* 4.9*  --  1.0  --   --     Liver Function Tests: Recent Labs  Lab 09/27/19 1521  AST 19  ALT 10  ALKPHOS 31*  BILITOT 0.5  PROT 5.5*  ALBUMIN 3.4*   No results for input(s): LIPASE, AMYLASE in the last 168 hours. No results for input(s): AMMONIA in the last 168 hours.  ABG    Component Value Date/Time   TCO2 18 (L) 09/27/2019 1526     Coagulation Profile: Recent Labs  Lab 09/27/19 1521  INR 1.3*    Cardiac Enzymes: No results for input(s): CKTOTAL, CKMB, CKMBINDEX, TROPONINI in the last 168 hours.  HbA1C: Hgb A1c MFr Bld  Date/Time Value Ref Range Status  05/24/2015 02:22 AM 5.9 (H) 4.8 - 5.6 % Final    Comment:    (NOTE)         Pre-diabetes: 5.7 - 6.4         Diabetes: >6.4         Glycemic control for adults with diabetes: <7.0     CBG: Recent Labs  Lab 09/27/19 1609 09/27/19 2055 09/28/19 0004 09/28/19 0405  GLUCAP 74 110* 128* 111*   Allergies No Known Allergies    Home Medications  Prior to Admission medications   Medication Sig Start Date End Date Taking? Authorizing Provider  aspirin EC 81 MG tablet Take 1 tablet (81 mg total) by mouth daily. 04/02/18  Yes Weaver, Scott T, PA-C  clopidogrel (PLAVIX) 75 MG tablet Take 75 mg by mouth daily. 05/04/15  Yes [provider]  ezetimibe (ZETIA) 10 MG tablet Take 10 mg by mouth daily.  08/03/16  Yes [provider]  ibuprofen (ADVIL) 200 MG tablet Take 600 mg by mouth every 6 (six) hours as needed for moderate pain.   Yes [provider]  rosuvastatin (CRESTOR) 20 MG tablet Take 20 mg by mouth daily.   Yes [provider]     Critical care time: 34 minutes    Magdalen Spatz, MSN, AGACNP-BC [place] Pager # [phone] After 4 pm please call [phone] 09/28/2019 7:34 AM   Pulmonary critical care attending:  58 year old peripheral vascular disease admitted for worsening left leg pain.  Developed acute GI bleeding.  Noted nearly 6 g blood loss.  Dark stools.  Hypotensive requiring product resuscitation plus vasopressor infusion.  Slowly titrating down on vasopressor support at this time.  Able to come down to 4 mcg of Levophed.  Discussed case with vascular surgery and GI.  Once more stable will plan for endoscopy.  Holding on anticoagulation.  Elevated troponin likely related to demand ischemia.  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 Continue to trend H&H Continue IV PPI Once off vasopressors can consider endoscopy. Discussed with GI. Holding anticoagulation, holding antiplatelets Troponin already trending down.  Continue to observe  Garner Nash, DO [facility] 09/28/2019 11:07 AM

## 2019-09-29 ENCOUNTER — Inpatient Hospital Stay (HOSPITAL_COMMUNITY): Payer: Medicare Other | Admitting: Certified Registered Nurse Anesthetist

## 2019-09-29 ENCOUNTER — Encounter (HOSPITAL_COMMUNITY): Payer: Self-pay | Admitting: Certified Registered Nurse Anesthetist

## 2019-09-29 ENCOUNTER — Encounter (HOSPITAL_COMMUNITY)
Admission: EM | Disposition: A | Discharge status: Home / Self Care | Payer: Self-pay | Source: Home / Self Care | Attending: Internal Medicine

## 2019-09-29 DIAGNOSIS — R569 Unspecified convulsions: Secondary | ICD-10-CM

## 2019-09-29 DIAGNOSIS — R778 Other specified abnormalities of plasma proteins: Secondary | ICD-10-CM

## 2019-09-29 DIAGNOSIS — I214 Non-ST elevation (NSTEMI) myocardial infarction: Secondary | ICD-10-CM

## 2019-09-29 DIAGNOSIS — I248 Other forms of acute ischemic heart disease: Secondary | ICD-10-CM | POA: Diagnosis not present

## 2019-09-29 DIAGNOSIS — K922 Gastrointestinal hemorrhage, unspecified: Secondary | ICD-10-CM

## 2019-09-29 DIAGNOSIS — K250 Acute gastric ulcer with hemorrhage: Secondary | ICD-10-CM

## 2019-09-29 DIAGNOSIS — R578 Other shock: Secondary | ICD-10-CM | POA: Diagnosis not present

## 2019-09-29 DIAGNOSIS — D62 Acute posthemorrhagic anemia: Secondary | ICD-10-CM

## 2019-09-29 HISTORY — PX: ESOPHAGOGASTRODUODENOSCOPY (EGD) WITH PROPOFOL: SHX5813

## 2019-09-29 LAB — COMPREHENSIVE METABOLIC PANEL
ALT: 17 U/L (ref 0–44)
AST: 43 U/L — ABNORMAL HIGH (ref 15–41)
Albumin: 2.6 g/dL — ABNORMAL LOW (ref 3.5–5.0)
Alkaline Phosphatase: 36 U/L — ABNORMAL LOW (ref 38–126)
Anion gap: 4 — ABNORMAL LOW (ref 5–15)
BUN: 12 mg/dL (ref 6–20)
CO2: 26 mmol/L (ref 22–32)
Calcium: 8.1 mg/dL — ABNORMAL LOW (ref 8.9–10.3)
Chloride: 103 mmol/L (ref 98–111)
Creatinine, Ser: 0.73 mg/dL (ref 0.61–1.24)
GFR calc Af Amer: >60 mL/min (ref >60)
GFR calc non Af Amer: >60 mL/min (ref >60)
Glucose, Bld: 91 mg/dL (ref 70–99)
Potassium: 3.4 mmol/L — ABNORMAL LOW (ref 3.5–5.1)
Sodium: 133 mmol/L — ABNORMAL LOW (ref 135–145)
Total Bilirubin: 0.8 mg/dL (ref 0.3–1.2)
Total Protein: 4.7 g/dL — ABNORMAL LOW (ref 6.5–8.1)

## 2019-09-29 LAB — CBC
HCT: 32.3 % — ABNORMAL LOW (ref 39.0–52.0)
Hemoglobin: 11.4 g/dL — ABNORMAL LOW (ref 13.0–17.0)
MCH: 30.7 pg (ref 26.0–34.0)
MCHC: 35.3 g/dL (ref 30.0–36.0)
MCV: 87.1 fL (ref 80.0–100.0)
Platelets: 172 10*3/uL (ref 150–400)
RBC: 3.71 MIL/uL — ABNORMAL LOW (ref 4.22–5.81)
RDW: 14.4 % (ref 11.5–15.5)
WBC: 15.3 10*3/uL — ABNORMAL HIGH (ref 4.0–10.5)
nRBC: 0 % (ref 0.0–0.2)

## 2019-09-29 LAB — GLUCOSE, CAPILLARY
Glucose-Capillary: 87 mg/dL (ref 70–99)
Glucose-Capillary: 90 mg/dL (ref 70–99)
Glucose-Capillary: 89 mg/dL (ref 70–99)
Glucose-Capillary: 74 mg/dL (ref 70–99)
Glucose-Capillary: 84 mg/dL (ref 70–99)
Glucose-Capillary: 82 mg/dL (ref 70–99)

## 2019-09-29 LAB — HEMOGLOBIN AND HEMATOCRIT, BLOOD
HCT: 30.6 % — ABNORMAL LOW (ref 39.0–52.0)
HCT: 33 % — ABNORMAL LOW (ref 39.0–52.0)
Hemoglobin: 11 g/dL — ABNORMAL LOW (ref 13.0–17.0)
Hemoglobin: 11.4 g/dL — ABNORMAL LOW (ref 13.0–17.0)

## 2019-09-29 LAB — MAGNESIUM: Magnesium: 2 mg/dL (ref 1.7–2.4)

## 2019-09-29 SURGERY — ESOPHAGOGASTRODUODENOSCOPY (EGD) WITH PROPOFOL
Anesthesia: Monitor Anesthesia Care

## 2019-09-29 MED ORDER — PROPOFOL 500 MG/50ML IV EMUL
INTRAVENOUS | Status: DC | PRN
  Administered 2019-09-29: 100 ug/kg/min via INTRAVENOUS

## 2019-09-29 MED ORDER — POTASSIUM CHLORIDE CRYS ER 20 MEQ PO TBCR
40.0000 meq | EXTENDED_RELEASE_TABLET | Freq: Once | ORAL | Status: AC
  Administered 2019-09-29: 40 meq via ORAL
  Filled 2019-09-29: qty 2

## 2019-09-29 MED ORDER — LACTATED RINGERS IV SOLN
INTRAVENOUS | Status: DC | PRN
  Administered 2019-09-29: 14:00:00 via INTRAVENOUS

## 2019-09-29 MED ORDER — PHENYLEPHRINE 40 MCG/ML (10ML) SYRINGE FOR IV PUSH (FOR BLOOD PRESSURE SUPPORT)
PREFILLED_SYRINGE | INTRAVENOUS | Status: DC | PRN
  Administered 2019-09-29: 120 ug via INTRAVENOUS
  Administered 2019-09-29: 80 ug via INTRAVENOUS

## 2019-09-29 SURGICAL SUPPLY — 15 items

## 2019-09-29 NOTE — Anesthesia Preprocedure Evaluation (Signed)
Anesthesia Evaluation  Patient identified by MRN, date of birth, ID band Patient awake    Reviewed: Allergy & Precautions, NPO status , Patient's Chart, lab work & pertinent test results  History of Anesthesia Complications Negative for: history of anesthetic complications  Airway Mallampati: II  TM Distance: >3 FB Neck ROM: Full    Dental  (+) Poor Dentition, Dental Advisory Given, Missing   Pulmonary COPD, Patient abstained from smoking., former smoker,    breath sounds clear to auscultation       Cardiovascular + Past MI and + Peripheral Vascular Disease   Rhythm:Regular Rate:Normal  Echo 09/28/2019  1. Left ventricular ejection fraction, by visual estimation, is 50 to 55%. The left ventricle has normal function. There is moderately increased left ventricular hypertrophy.  2. Left ventricular diastolic parameters are consistent with Grade II diastolic dysfunction (pseudonormalization).  3. Global right ventricle has normal systolic function.The right ventricular size is normal.  4. Left atrial size was normal.  5. Right atrial size was normal.  6. The mitral valve is normal in structure. Mild mitral valve regurgitation. No evidence of mitral stenosis.  7. The tricuspid valve is normal in structure. Tricuspid valve regurgitation is mild.  8. The aortic valve is tricuspid. Aortic valve regurgitation is not visualized. Mild aortic valve sclerosis without stenosis.  9. The pulmonic valve was normal in structure. Pulmonic valve regurgitation is not visualized. 10. Mildly elevated pulmonary artery systolic pressure. 11. The inferior vena cava is normal in size with greater than 50% respiratory variability, suggesting right atrial pressure of 3 mmHg. 12. Hypokinesis of the inferolateral wall with overall preserved LV function; moderate LVH; grade 2 diastolic dysfunction; mild MR and TR; mildly elevated pulmonary pressure.     Neuro/Psych Seizures -,  TIACVA, No Residual Symptoms    GI/Hepatic negative GI ROS,   Endo/Other    Renal/GU negative Renal ROS     Musculoskeletal   Abdominal   Peds  Hematology   Anesthesia Other Findings   Reproductive/Obstetrics                             Anesthesia Physical  Anesthesia Plan  ASA: III  Anesthesia Plan: MAC   Post-op Pain Management:    Induction: Intravenous  PONV Risk Score and Plan:   Airway Management Planned: Natural Airway and Simple Face Mask  Additional Equipment: None  Intra-op Plan:   Post-operative Plan:   Informed Consent: I have reviewed the patients History and Physical, chart, labs and discussed the procedure including the risks, benefits and alternatives for the proposed anesthesia with the patient or authorized representative who has indicated his/her understanding and acceptance.     Dental advisory given  Plan Discussed with: CRNA  Anesthesia Plan Comments:         Anesthesia Quick Evaluation

## 2019-09-29 NOTE — Transfer of Care (Addendum)
Immediate Anesthesia Transfer of Care Note  Patient: Raymond Carney  Procedure(s) Performed: ESOPHAGOGASTRODUODENOSCOPY (EGD) WITH PROPOFOL (N/A )  Patient Location: Endoscopy Unit  Anesthesia Type:MAC  Level of Consciousness: awake, alert  and oriented  Airway & Oxygen Therapy: Patient Spontanous Breathing and Patient connected to nasal cannula oxygen  Post-op Assessment: Report given to RN and Post -op Vital signs reviewed and stable  Post vital signs: Reviewed and stable  Last Vitals:  Vitals Value Taken Time  BP 89/66   Temp 36.5 C 09/29/19 1402  Pulse 54 09/29/19 1402  Resp 16 09/29/19 1402  SpO2 100 % 09/29/19 1402    Last Pain:  Vitals:   09/29/19 1402  TempSrc: Temporal  PainSc: 0-No pain         Complications: No apparent anesthesia complications

## 2019-09-29 NOTE — Progress Notes (Signed)
PCCM to [facility] transfer:  Patient with h/o CVA; severe PAD; HLD; and COPD who presented on 11/28 with leg pain, concern for ischemic limb.  While in the ER, he had a seizure and became severly hypotensive with apparent GI bleed, Hgb 6 (down from 13 2 months prior) with hemorrhagic shock.  + melena.  Received 4 units PRBC and improved yesterday but ongoing monitoring.  Plan was EGD yesterday but decided to monitor instead without intervention.  No other vasopressors or blood products needed in 24 hours.  Hgb stable after multiple checks.  Plan is to do EGD today or tomorrow.  Stable for transfer out of ICU, will send to telemetry.  [facility] will assume care of the patient on 12/1.   Carlyon Shadow, M.D.

## 2019-09-29 NOTE — Progress Notes (Signed)
Progress Note   Subjective  Patient doing well this AM. No chest pain. No abdominal pain. He has not had any further blood per rectum. There was a report of dark stools initially, patient states he was unaware of this. Had a dramatic drop in Hgb and became hypotensive yesterday requiring pressors, off since 1 PM yesterday. Got 4 units RBC and responded appropriately. Echo shows good EF   Objective   Vital signs in last 24 hours: Temp:  [98.4 F (36.9 C)-99.1 F (37.3 C)] 98.8 F (37.1 C) (11/30 0800) Pulse Rate:  [57-78] 70 (11/30 0800) Resp:  [11-23] 13 (11/30 0800) BP: (85-132)/(55-88) 122/81 (11/30 0800) SpO2:  [94 %-100 %] 100 % (11/30 0800) Arterial Line BP: (72-101)/(48-71) 72/67 (11/30 0700) Weight:  [59.2 kg] 59.2 kg (11/30 0500) Last BM Date: 09/27/19 General:    white male in NAD Heart:  Regular rate and rhythm; no murmurs Lungs: Respirations even and unlabored, lungs CTA bilaterally Abdomen:  Soft, nontender and nondistended.  Extremities:  Without edema. Neurologic:  Alert and oriented,  grossly normal neurologically. Psych:  Cooperative. Normal mood and affect.  Intake/Output from previous day: 11/29 0701 - 11/30 0700 In: 1637.7 [I.V.:1210.8; IV Piggyback:426.9] Out: 2725 [Urine:2725] Intake/Output this shift: Total I/O In: 50 [I.V.:50] Out: -   Lab Results: Recent Labs    09/27/19 2126 09/28/19 0324  09/28/19 1723 09/28/19 2347 09/29/19 0524  WBC 24.9* 23.4*  --   --   --  15.3*  HGB 12.6* 11.7*   < > 11.1* 11.0* 11.4*  HCT 35.4* 33.5*   < > 31.3* 30.6* 32.3*  PLT 167 172  --   --   --  172   < > = values in this interval not displayed.   BMET Recent Labs    09/27/19 1521 09/27/19 1526 09/28/19 0324 09/29/19 0524  NA 137 135 137 133*  K 3.9 4.1 3.3* 3.4*  CL 103 103 110 103  CO2 15*  --  19* 26  GLUCOSE 128* 123* 121* 91  BUN 45* 47* 26* 12  CREATININE 1.15 1.00 0.78 0.73  CALCIUM 8.7*  --  7.7* 8.1*   LFT Recent Labs   09/29/19 0524  PROT 4.7*  ALBUMIN 2.6*  AST 43*  ALT 17  ALKPHOS 36*  BILITOT 0.8   PT/INR Recent Labs    09/27/19 1521 09/28/19 0850  LABPROT 15.6* 14.6  INR 1.3* 1.2    Studies/Results: Ct Angio Aortobifemoral W And/or Wo Contrast  Result Date: 09/27/2019 CLINICAL DATA:  Left leg pain and absent left femoral pulses. EXAM: CT ANGIOGRAPHY AOBIFEM WITH CONTRAST TECHNIQUE: Abdomen, pelvis, and bilateral lower extremity CTA was performed after bolus administration of iodinated contrast. Noncontrast phase was not acquired. CONTRAST:  167mL OMNIPAQUE IOHEXOL 350 MG/ML SOLN COMPARISON:  None. FINDINGS: Aorta: Extensive atheromatous wall thickening with both calcified and noncalcified plaque. No dissection or aneurysm. Celiac: Moderate narrowing at the origin. Moderate narrowing the origin and just before the splenic common hepatic bifurcation. No acute finding. SMA: Motion artifact at the level of the proximal SMA. There is atherosclerosis without flow limiting stenosis or proximal occlusion. Renal: Severe right renal artery stenosis with underfilling compared to the left. Proximal left renal artery stenosis is also high-grade, not measurable due to small vessel size and degree of narrowing. IMA: Patent with stenosis and atherosclerosis the origin. Left iliacs and lower extremity: Occluded left common iliac artery at the origin with downstream underfilling suggesting this is chronic.  The iliacs are heavily calcified. Faint reconstitution seen by the level of the common femoral artery where there is bulky calcified plaque. The superficial femoral artery on the left is occluded proximally with minimal thready flow seen at the midportion in the none seen at the level of the lower SFA. Branches of the profundus femoris do enhance. Muscular branches reconstitute the popliteal artery from the lateral geniculate branches and there is patent although diffusely significantly attenuated lower popliteal artery  and upper calf vessels. Faint posterior tibial artery is seen to the level of the ankle but not at the level of the foot. 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 There is diffuse narrowing of the external iliac artery with high-grade narrowing at the right common femoral artery. Severe and diffuse proximal right SFA stenosis. The profunda femoris is patent, as is the distal SFA although severely stenotic. Heavily diseased popliteal artery. The tibioperoneal trunk is patent with patent posterior tibial and anterior tibial artery to the level of the ankle. Lower chest: Motion artifact. Circumferential atheromatous wall thickening of the aorta. Hepatobiliary: No focal liver abnormality.No evidence of biliary obstruction or stone. Pancreas: Unremarkable. Spleen: Small gland without acute abnormality. Adrenals/Urinary Tract: Negative adrenals. No hydronephrosis or stone. Relative, smooth right renal atrophy attributed to severe arterial stenosis. Unremarkable bladder. Stomach/Bowel: No obstruction. No evidence of bowel inflammation or ischemia. Vascular/Lymphatic: Vascular findings below no mass or adenopathy. Reproductive:No pathologic findings. Other: No ascites or pneumoperitoneum. 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 Subchondral collapse in the lateral femoral condyle on the left. Bilateral hip replacement. Diffuse osteopenia. Motion artifact. Review of the MIP images confirms the above findings. IMPRESSION: 1. On the symptomatic left side there is an occluded common iliac artery which appears chronic as there is collapse of the downstream vessels. Most of the left SFA is chronically occluded with well-formed profunda collaterals reconstituting the severely attenuated and heavily diseased popliteal artery. Severely attenuated left calf vessels, the posterior tibial artery makes it  most distal into the calf but is no longer enhancing by the level of the foot. 2. Right lower extremity PVD is also severe with diffusely and significantly attenuated SFA and popliteal artery. There is single vessel runoff into the right foot. 3. High-grade bilateral proximal renal artery stenosis, worse on the right where there is downstream underfilling and right renal atrophy. 4. Motion degraded. Electronically Signed   By: Monte Fantasia M.D.   On: 09/27/2019 17:04       Assessment / Plan:   58 y/o male on Plavix and aspirin, history of PVD, presented with worsening leg pain, had CTA yesterday to evaluate that when he developed severe hypotension. Hgb dropped from baseline 13 to 6s, endorsed some melena. Now s/p 4 units RBC with appropriate response. He has not had any bleeding symptoms overnight. He had marked troponin elevation in the setting of demand ischemia / hypotension per cardiology. Echocardiogram done and shows EF of 50-55%. Vascular surgery has seen the patient, noted to have a chronic occlusion of the left common iliac artery, became symptomatic in the setting of anemia, they did not recommend any therapy for this at present time. Cardiology planning left heart cath prior to his discharge and pending our GI evaluation.   The patient looks remarkably well today and is stable. I have discussed the need for upper endoscopy with the patient, including risks / benefits, and he wishes to  proceed. We will plan on performing that this afternoon. Please keep him NPO and continue IV PPI for now, continue to hold Plavix, further recommendations pending EGD. He agreed.  Of note, the patient did mention he had a colonoscopy with [facility] about 3 years ago. I discussed his case with Dr. Paulita Fujita, since I have seen the patient today already we will continue covering his inpatient care and he can follow up with them after his discharge.   [place] Cellar, MD [facility]

## 2019-09-29 NOTE — Anesthesia Postprocedure Evaluation (Signed)
Anesthesia Post Note  Patient: Raymond Carney  Procedure(s) Performed: ESOPHAGOGASTRODUODENOSCOPY (EGD) WITH PROPOFOL (N/A )     Patient location during evaluation: PACU Anesthesia Type: MAC Level of consciousness: awake and alert Pain management: pain level controlled Vital Signs Assessment: post-procedure vital signs reviewed and stable Respiratory status: spontaneous breathing, nonlabored ventilation and respiratory function stable Cardiovascular status: blood pressure returned to baseline and stable Postop Assessment: no apparent nausea or vomiting Anesthetic complications: no    Last Vitals:  Vitals:   09/29/19 1447 09/29/19 1500  BP: 97/70   Pulse: (!) 55 60  Resp: 14 17  Temp:    SpO2: 100% 100%    Last Pain:  Vitals:   09/29/19 1402  TempSrc: Temporal  PainSc: 0-No pain                 Pervis Hocking

## 2019-09-29 NOTE — Progress Notes (Signed)
Cardiology Progress Note  Patient ID: Raymond Carney MRN: [id number] DOB: [birth year] Date of Encounter: 09/29/2019  Primary Cardiologist: Candee Furbish, MD  Subjective  Hemoglobin 11.4 no complaints of chest pain.  Reports he is doing well.  N.p.o. for potential EGD.  ROS:  All other ROS reviewed and negative. Pertinent positives noted in the HPI.     Inpatient Medications  Scheduled Meds:  sodium chloride   Intravenous Once   sodium chloride   Intravenous Once   Chlorhexidine Gluconate Cloth  6 each Topical Daily   insulin aspart  0-9 Units Subcutaneous Q4H   mouth rinse  15 mL Mouth Rinse BID   pantoprazole (PROTONIX) IV  40 mg Intravenous Q12H   Continuous Infusions:  sodium chloride     sodium chloride     dextrose 5 % and 0.9% NaCl 50 mL/hr at 09/29/19 0800   PRN Meds: acetaminophen, fentaNYL (SUBLIMAZE) injection, ondansetron (ZOFRAN) IV   Vital Signs   Vitals:   09/29/19 0600 09/29/19 0700 09/29/19 0730 09/29/19 0800  BP: 109/72 104/81  122/81  Pulse: 66 60 (!) 57 70  Resp: 18 17 17 13   Temp: 98.6 F (37 C) 98.8 F (37.1 C) 98.4 F (36.9 C) 98.8 F (37.1 C)  TempSrc:      SpO2: 98% 96% 98% 100%  Weight:      Height:        Intake/Output Summary (Last 24 hours) at 09/29/2019 0915 Last data filed at 09/29/2019 0800 Gross per 24 hour  Intake 1600.02 ml  Output 2725 ml  Net -1124.98 ml   Last 3 Weights 09/29/2019 09/28/2019 09/27/2019  Weight (lbs) 130 lb 8.2 oz 134 lb 14.7 oz 143 lb 4.8 oz  Weight (kg) 59.2 kg 61.2 kg 65 kg      Telemetry  Overnight telemetry shows normal sinus rhythm heart rate 60s, which I personally reviewed.   Physical Exam   Vitals:   09/29/19 0600 09/29/19 0700 09/29/19 0730 09/29/19 0800  BP: 109/72 104/81  122/81  Pulse: 66 60 (!) 57 70  Resp: 18 17 17 13   Temp: 98.6 F (37 C) 98.8 F (37.1 C) 98.4 F (36.9 C) 98.8 F (37.1 C)  TempSrc:      SpO2: 98% 96% 98% 100%  Weight:      Height:          Intake/Output Summary (Last 24 hours) at 09/29/2019 0915 Last data filed at 09/29/2019 0800 Gross per 24 hour  Intake 1600.02 ml  Output 2725 ml  Net -1124.98 ml    Last 3 Weights 09/29/2019 09/28/2019 09/27/2019  Weight (lbs) 130 lb 8.2 oz 134 lb 14.7 oz 143 lb 4.8 oz  Weight (kg) 59.2 kg 61.2 kg 65 kg    Body mass index is 19.84 kg/m.  General: Well nourished, well developed, in no acute distress Head: Atraumatic, normal size  Eyes: PEERLA, EOMI  Neck: Supple, no JVD Endocrine: No thryomegaly Cardiac: Normal S1, S2; RRR; no murmurs, rubs, or gallops Lungs: Clear to auscultation bilaterally, no wheezing, rhonchi or rales  Abd: Soft, nontender, no hepatomegaly  Ext: No edema, pulses 2+ Musculoskeletal: No deformities, BUE and BLE strength normal and equal Skin: Warm and dry, no rashes   Neuro: Alert and oriented to person, place, time, and situation, CNII-XII grossly intact, no focal deficits  Psych: Normal mood and affect   Labs  High Sensitivity Troponin:   Recent Labs  Lab 09/27/19 2126 09/27/19 2349 09/28/19 0324 09/28/19 [id number] 09/28/19 [id number]  TROPONINIHS [id number]* [id number]*     Cardiac EnzymesNo results for input(s): TROPONINI in the last 168 hours. No results for input(s): TROPIPOC in the last 168 hours.  Chemistry Recent Labs  Lab 09/27/19 1521 09/27/19 1526 09/28/19 0324 09/29/19 0524  NA 137 135 137 133*  K 3.9 4.1 3.3* 3.4*  CL 103 103 110 103  CO2 15*  --  19* 26  GLUCOSE 128* 123* 121* 91  BUN 45* 47* 26* 12  CREATININE 1.15 1.00 0.78 0.73  CALCIUM 8.7*  --  7.7* 8.1*  PROT 5.5*  --   --  4.7*  ALBUMIN 3.4*  --   --  2.6*  AST 19  --   --  43*  ALT 10  --   --  17  ALKPHOS 31*  --   --  36*  BILITOT 0.5  --   --  0.8  GFRNONAA >60  --  >60 >60  GFRAA >60  --  >60 >60  ANIONGAP 19*  --  8 4*    Hematology Recent Labs  Lab 09/27/19 2126 09/28/19 0324  09/28/19 1723 09/28/19 2347 09/29/19 0524  WBC 24.9* 23.4*  --   --    --  15.3*  RBC 4.13* 3.87*  --   --   --  3.71*  HGB 12.6* 11.7*   < > 11.1* 11.0* 11.4*  HCT 35.4* 33.5*   < > 31.3* 30.6* 32.3*  MCV 85.7 86.6  --   --   --  87.1  MCH 30.5 30.2  --   --   --  30.7  MCHC 35.6 34.9  --   --   --  35.3  RDW 13.6 13.9  --   --   --  14.4  PLT 167 172  --   --   --  172   < > = values in this interval not displayed.   BNPNo results for input(s): BNP, PROBNP in the last 168 hours.  DDimer No results for input(s): DDIMER in the last 168 hours.   Radiology  Ct Angio Aortobifemoral W And/or Wo Contrast  Result Date: 09/27/2019 CLINICAL DATA:  Left leg pain and absent left femoral pulses. EXAM: CT ANGIOGRAPHY AOBIFEM WITH CONTRAST TECHNIQUE: Abdomen, pelvis, and bilateral lower extremity CTA was performed after bolus administration of iodinated contrast. Noncontrast phase was not acquired. CONTRAST:  115mL OMNIPAQUE IOHEXOL 350 MG/ML SOLN COMPARISON:  None. FINDINGS: Aorta: Extensive atheromatous wall thickening with both calcified and noncalcified plaque. No dissection or aneurysm. Celiac: Moderate narrowing at the origin. Moderate narrowing the origin and just before the splenic common hepatic bifurcation. No acute finding. SMA: Motion artifact at the level of the proximal SMA. There is atherosclerosis without flow limiting stenosis or proximal occlusion. Renal: Severe right renal artery stenosis with underfilling compared to the left. Proximal left renal artery stenosis is also high-grade, not measurable due to small vessel size and degree of narrowing. IMA: Patent with stenosis and atherosclerosis the origin. Left iliacs and lower extremity: Occluded left common iliac artery at the origin with downstream underfilling suggesting this is chronic. The iliacs are heavily calcified. Faint reconstitution seen by the level of the common femoral artery where there is bulky calcified plaque. The superficial femoral artery on the left is occluded proximally with minimal thready  flow seen at the midportion in the none seen at the level of the lower SFA. Branches of the profundus femoris do enhance. Muscular branches reconstitute the  popliteal artery from the lateral geniculate branches and there is patent although diffusely significantly attenuated lower popliteal artery and upper calf vessels. Faint posterior tibial artery is seen to the level of the ankle but not at the level of the foot. 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 There is diffuse narrowing of the external iliac artery with high-grade narrowing at the right common femoral artery. Severe and diffuse proximal right SFA stenosis. The profunda femoris is patent, as is the distal SFA although severely stenotic. Heavily diseased popliteal artery. The tibioperoneal trunk is patent with patent posterior tibial and anterior tibial artery to the level of the ankle. Lower chest: Motion artifact. Circumferential atheromatous wall thickening of the aorta. Hepatobiliary: No focal liver abnormality.No evidence of biliary obstruction or stone. Pancreas: Unremarkable. Spleen: Small gland without acute abnormality. Adrenals/Urinary Tract: Negative adrenals. No hydronephrosis or stone. Relative, smooth right renal atrophy attributed to severe arterial stenosis. Unremarkable bladder. Stomach/Bowel: No obstruction. No evidence of bowel inflammation or ischemia. Vascular/Lymphatic: Vascular findings below no mass or adenopathy. Reproductive:No pathologic findings. Other: No ascites or pneumoperitoneum. 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 Subchondral collapse in the lateral femoral condyle on the left. Bilateral hip replacement. Diffuse osteopenia. Motion artifact. Review of the MIP images confirms the above findings. IMPRESSION: 1. On the symptomatic left side there is an occluded common iliac artery which  appears chronic as there is collapse of the downstream vessels. Most of the left SFA is chronically occluded with well-formed profunda collaterals reconstituting the severely attenuated and heavily diseased popliteal artery. Severely attenuated left calf vessels, the posterior tibial artery makes it most distal into the calf but is no longer enhancing by the level of the foot. 2. Right lower extremity PVD is also severe with diffusely and significantly attenuated SFA and popliteal artery. There is single vessel runoff into the right foot. 3. High-grade bilateral proximal renal artery stenosis, worse on the right where there is downstream underfilling and right renal atrophy. 4. Motion degraded. Electronically Signed   By: Monte Fantasia M.D.   On: 09/27/2019 17:04    Cardiac Studies  TTE 09/28/2019  1. Left ventricular ejection fraction, by visual estimation, is 50 to 55%. The left ventricle has normal function. There is moderately increased left ventricular hypertrophy.  2. Left ventricular diastolic parameters are consistent with Grade II diastolic dysfunction (pseudonormalization).  3. Global right ventricle has normal systolic function.The right ventricular size is normal.  4. Left atrial size was normal.  5. Right atrial size was normal.  6. The mitral valve is normal in structure. Mild mitral valve regurgitation. No evidence of mitral stenosis.  7. The tricuspid valve is normal in structure. Tricuspid valve regurgitation is mild.  8. The aortic valve is tricuspid. Aortic valve regurgitation is not visualized. Mild aortic valve sclerosis without stenosis.  9. The pulmonic valve was normal in structure. Pulmonic valve regurgitation is not visualized. 10. Mildly elevated pulmonary artery systolic pressure. 11. The inferior vena cava is normal in size with greater than 50% respiratory variability, suggesting right atrial pressure of 3 mmHg. 12. Hypokinesis of the inferolateral wall with overall  preserved LV function; moderate LVH; grade 2 diastolic dysfunction; mild MR and TR; mildly elevated pulmonary pressure.  Patient Profile  Raymond Carney is a 58 y.o. 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  Assessment &  Plan  1.  Likely nonmyocardial infarction troponin elevation: Had profound blood loss anemia with hemoglobin around 6.  Had diffuse ST depressions and ST elevation in aVR concerning for global ischemia.  His echocardiogram shows normal left ventricular function with inferolateral wall motion hypokinesis.  He has had no chest pain reports none today.  Review of his telemetry is unremarkable.  We will continue to hold aspirin therapy for now.  They are waiting for GI to determine when he will have evaluation for his anemia.  We will likely plan for a left heart catheterization prior to discharge.  This will ultimately depend on what is seen by our gastrointestinal colleagues.  There is absolutely no rush to pursue cardiac cath at this time.  Given his lack of symptoms and overall clinical stability, this is reassuring.  For questions or updates, please contact [facility] Please consult [email or web address] for contact info under   Signed, [place] T. Audie Box, [employer]  09/29/2019 9:15 AM

## 2019-09-29 NOTE — H&P (View-Only) (Signed)
Progress Note   Subjective  Patient doing well this AM. No chest pain. No abdominal pain. He has not had any further blood per rectum. There was a report of dark stools initially, patient states he was unaware of this. Had a dramatic drop in Hgb and became hypotensive yesterday requiring pressors, off since 1 PM yesterday. Got 4 units RBC and responded appropriately. Echo shows good EF   Objective   Vital signs in last 24 hours: Temp:  [98.4 F (36.9 C)-99.1 F (37.3 C)] 98.8 F (37.1 C) (11/30 0800) Pulse Rate:  [57-78] 70 (11/30 0800) Resp:  [11-23] 13 (11/30 0800) BP: (85-132)/(55-88) 122/81 (11/30 0800) SpO2:  [94 %-100 %] 100 % (11/30 0800) Arterial Line BP: (72-101)/(48-71) 72/67 (11/30 0700) Weight:  [59.2 kg] 59.2 kg (11/30 0500) Last BM Date: 09/27/19 General:    white male in NAD Heart:  Regular rate and rhythm; no murmurs Lungs: Respirations even and unlabored, lungs CTA bilaterally Abdomen:  Soft, nontender and nondistended.  Extremities:  Without edema. Neurologic:  Alert and oriented,  grossly normal neurologically. Psych:  Cooperative. Normal mood and affect.  Intake/Output from previous day: 11/29 0701 - 11/30 0700 In: 1637.7 [I.V.:1210.8; IV Piggyback:426.9] Out: 2725 [Urine:2725] Intake/Output this shift: Total I/O In: 50 [I.V.:50] Out: -   Lab Results: Recent Labs    09/27/19 2126 09/28/19 0324  09/28/19 1723 09/28/19 2347 09/29/19 0524  WBC 24.9* 23.4*  --   --   --  15.3*  HGB 12.6* 11.7*   < > 11.1* 11.0* 11.4*  HCT 35.4* 33.5*   < > 31.3* 30.6* 32.3*  PLT 167 172  --   --   --  172   < > = values in this interval not displayed.   BMET Recent Labs    09/27/19 1521 09/27/19 1526 09/28/19 0324 09/29/19 0524  NA 137 135 137 133*  K 3.9 4.1 3.3* 3.4*  CL 103 103 110 103  CO2 15*  --  19* 26  GLUCOSE 128* 123* 121* 91  BUN 45* 47* 26* 12  CREATININE 1.15 1.00 0.78 0.73  CALCIUM 8.7*  --  7.7* 8.1*   LFT Recent Labs   09/29/19 0524  PROT 4.7*  ALBUMIN 2.6*  AST 43*  ALT 17  ALKPHOS 36*  BILITOT 0.8   PT/INR Recent Labs    09/27/19 1521 09/28/19 0850  LABPROT 15.6* 14.6  INR 1.3* 1.2    Studies/Results: Ct Angio Aortobifemoral W And/or Wo Contrast  Result Date: 09/27/2019 CLINICAL DATA:  Left leg pain and absent left femoral pulses. EXAM: CT ANGIOGRAPHY AOBIFEM WITH CONTRAST TECHNIQUE: Abdomen, pelvis, and bilateral lower extremity CTA was performed after bolus administration of iodinated contrast. Noncontrast phase was not acquired. CONTRAST:  188mL OMNIPAQUE IOHEXOL 350 MG/ML SOLN COMPARISON:  None. FINDINGS: Aorta: Extensive atheromatous wall thickening with both calcified and noncalcified plaque. No dissection or aneurysm. Celiac: Moderate narrowing at the origin. Moderate narrowing the origin and just before the splenic common hepatic bifurcation. No acute finding. SMA: Motion artifact at the level of the proximal SMA. There is atherosclerosis without flow limiting stenosis or proximal occlusion. Renal: Severe right renal artery stenosis with underfilling compared to the left. Proximal left renal artery stenosis is also high-grade, not measurable due to small vessel size and degree of narrowing. IMA: Patent with stenosis and atherosclerosis the origin. Left iliacs and lower extremity: Occluded left common iliac artery at the origin with downstream underfilling suggesting this is chronic.  The iliacs are heavily calcified. Faint reconstitution seen by the level of the common femoral artery where there is bulky calcified plaque. The superficial femoral artery on the left is occluded proximally with minimal thready flow seen at the midportion in the none seen at the level of the lower SFA. Branches of the profundus femoris do enhance. Muscular branches reconstitute the popliteal artery from the lateral geniculate branches and there is patent although diffusely significantly attenuated lower popliteal artery  and upper calf vessels. Faint posterior tibial artery is seen to the level of the ankle but not at the level of the foot. 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 There is diffuse narrowing of the external iliac artery with high-grade narrowing at the right common femoral artery. Severe and diffuse proximal right SFA stenosis. The profunda femoris is patent, as is the distal SFA although severely stenotic. Heavily diseased popliteal artery. The tibioperoneal trunk is patent with patent posterior tibial and anterior tibial artery to the level of the ankle. Lower chest: Motion artifact. Circumferential atheromatous wall thickening of the aorta. Hepatobiliary: No focal liver abnormality.No evidence of biliary obstruction or stone. Pancreas: Unremarkable. Spleen: Small gland without acute abnormality. Adrenals/Urinary Tract: Negative adrenals. No hydronephrosis or stone. Relative, smooth right renal atrophy attributed to severe arterial stenosis. Unremarkable bladder. Stomach/Bowel: No obstruction. No evidence of bowel inflammation or ischemia. Vascular/Lymphatic: Vascular findings below no mass or adenopathy. Reproductive:No pathologic findings. Other: No ascites or pneumoperitoneum. 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 Subchondral collapse in the lateral femoral condyle on the left. Bilateral hip replacement. Diffuse osteopenia. Motion artifact. Review of the MIP images confirms the above findings. IMPRESSION: 1. On the symptomatic left side there is an occluded common iliac artery which appears chronic as there is collapse of the downstream vessels. Most of the left SFA is chronically occluded with well-formed profunda collaterals reconstituting the severely attenuated and heavily diseased popliteal artery. Severely attenuated left calf vessels, the posterior tibial artery makes it  most distal into the calf but is no longer enhancing by the level of the foot. 2. Right lower extremity PVD is also severe with diffusely and significantly attenuated SFA and popliteal artery. There is single vessel runoff into the right foot. 3. High-grade bilateral proximal renal artery stenosis, worse on the right where there is downstream underfilling and right renal atrophy. 4. Motion degraded. Electronically Signed   By: Monte Fantasia M.D.   On: 09/27/2019 17:04       Assessment / Plan:   58 y/o male on Plavix and aspirin, history of PVD, presented with worsening leg pain, had CTA yesterday to evaluate that when he developed severe hypotension. Hgb dropped from baseline 13 to 6s, endorsed some melena. Now s/p 4 units RBC with appropriate response. He has not had any bleeding symptoms overnight. He had marked troponin elevation in the setting of demand ischemia / hypotension per cardiology. Echocardiogram done and shows EF of 50-55%. Vascular surgery has seen the patient, noted to have a chronic occlusion of the left common iliac artery, became symptomatic in the setting of anemia, they did not recommend any therapy for this at present time. Cardiology planning left heart cath prior to his discharge and pending our GI evaluation.   The patient looks remarkably well today and is stable. I have discussed the need for upper endoscopy with the patient, including risks / benefits, and he wishes to  proceed. We will plan on performing that this afternoon. Please keep him NPO and continue IV PPI for now, continue to hold Plavix, further recommendations pending EGD. He agreed.  Of note, the patient did mention he had a colonoscopy with [facility] about 3 years ago. I discussed his case with Dr. Paulita Fujita, since I have seen the patient today already we will continue covering his inpatient care and he can follow up with them after his discharge.   Mulino Cellar, MD [facility]

## 2019-09-29 NOTE — Progress Notes (Signed)
Patient off floor at 1230 to Endo for EGD with Endo RN. Will return to room once finished. VSS before leaving and no visible distress or complaints of pain.

## 2019-09-29 NOTE — Progress Notes (Addendum)
NAME:  Raymond Carney, MRN:  [id number], DOB:  1960-12-05, LOS: 2 ADMISSION DATE:  09/27/2019, CONSULTATION DATE:  09/27/19 REFERRING MD:  ER, CHIEF COMPLAINT:  Leg pain   Brief History   58 year old man with history of peripheral vascular disease presenting with worsening left leg pain.  Left leg noted to be pale and cold to touch, sent for CTA and evaluated by vascular who feel this is a more chronic femoral occlusion that does not need immediate attention.  On way back from CT, patient had seizure like episode and became severely hypotensive.  H/H noted to be 6 g/dL from 13 two months ago.  Patient is on ibuprofen, dual antiplatelet therapy at home.  Has been having black stool unclear when started, no hematochezia, hematemesis.  No history of GIB.  No history of seizures.  PCCM asked to admit for possible seizure and acute hemorrhagic shock.  History of present illness   58 year old man with history of peripheral vascular disease presenting with worsening left leg pain.  Left leg noted to be pale and cold to touch, sent for CTA and evaluated by vascular who feel this is a more chronic femoral occlusion that does not need immediate attention.  On way back from CT, patient had seizure like episode and became severely hypotensive.  H/H noted to be 6 g/dL from 13 two months ago.  Patient is on ibuprofen, dual antiplatelet therapy at home.  Has been having black stool unclear when started, no hematochezia, hematemesis.  No history of GIB.  No history of seizures.  PCCM asked to admit for possible seizure and acute hemorrhagic shock.  Past Medical History  Pituitary tumor s/p resection History of ischemic L stroke COPD L carotid stenosis R subclavian stenosis s/p angioplasty Left subclavian completely out [facility] Events   09/27/19- seen in ER, 4 units pRBC given + DDAVP 11/30 EGD planned  Consults:  PCCM Vascular GI  Procedures:  R radial arterial line  Significant  Diagnostic Tests:  CTA Aorta >> 11/28: Bedside echo- no pericardial effusion, RV decompressed, LVEF looks only slightly down 40-45% Echo 11/29 > EF 50-55%, G2DD. EGD 11/30 >   Micro Data:  11/28: COVID >>Negative   Antimicrobials:  None   Interim history/subjective:  No further bleeding.  Hgb stable. GI planing for EGD this afternoon.  Objective   Blood pressure 126/77, pulse 69, temperature 99.1 F (37.3 C), resp. rate 13, height 5\' 8"  (1.727 m), weight 59.2 kg, SpO2 99 %.        Intake/Output Summary (Last 24 hours) at 09/29/2019 1018 Last data filed at 09/29/2019 1000 Gross per 24 hour  Intake 1546.03 ml  Output 2975 ml  Net -1428.97 ml   Filed Weights   09/27/19 1500 09/28/19 0500 09/29/19 0500  Weight: 65 kg 61.2 kg 59.2 kg    Examination: General: Adult male, resting in bed, in NAD. Neuro: A&O x 3, no deficits. HEENT: [place]/AT. Sclerae anicteric. EOMI. Cardiovascular: RRR, no M/R/G.  Lungs: Respirations even and unlabored.  CTA bilaterally, No W/R/R. Abdomen: BS x 4, soft, NT/ND.  Musculoskeletal: No gross deformities, no edema.  Skin: Intact, warm, no rashes.    Assessment & Plan:   # Seizure like activity in setting of acute on chronic anemia; my suspicion here is more for a syncopal event/ vagal event Given one time dose of Keppra No further seizure activity overnight  Plan No further need for Keppra Continue to monitor  Seizure precautions Consider Consult Neuro if any additional seizure like activity  # Shock, hemorrhagic vs. Vasovagal, hx of NSAID use and dual antiplatelets, black stool concerning for PUD.   4 units pRBC given in ER for severe shock and likely syncopal event.   Shock now resolved. Plan GI following, planning for EGD this afternoon 11/30 Continue PPI Hold all anticoagulation, anti platelet therapy until EGD done Transfuse for HGB < 7  Elevated Troponin>> 9635 - Most likely demand ischemia in setting of GIB, HGB 5.  Echo  reassuring Monitor for chest pain  # Intermittent afib/RVR now better after resuscitation and currently rate controlled , with periods of NSR, Rate of 60-70 per tele Plan EKG prn Trend Mag and K, ensure Mag > 2 and K > 4 40 mEq K now  # Severe peripheral arterial disease complicating noninvasive determination of blood pressures Chronic femoral occlusion per vascular that does not need immediate attention.  L leg now without pain, with good cap refill, + pedal pulse Arterial line placed for accurate BP monitoring Plan No treatments needed per vascular unless he becomes symptomatic Neurovascular checks of LE Q 2 Fentanyl PRN pain Follow up with vascular surgery as OP  # Prior CVA with no residual deficits Plan Neuro checks per unit protocol Monitor for changes  # Urinary retention 2/2 increased vagal tone Resolved after insertion of foley cath Plan D/c foley   Best practice:  Diet: NPO Pain/Anxiety/Delirium protocol (if indicated): N/A VAP protocol (if indicated): N/A DVT prophylaxis: SCD to R leg GI prophylaxis: PPI Glucose control: SSI Mobility: BR Code Status: Full Family Communication: No family at bedside Disposition:  ICU  CC time: 30 min.   Montey Hora, [place] [facility] 09/29/2019, 10:32 AM   Pulmonary critical care attending:  58 year old gentleman peripheral vascular disease admitted for GI bleed.  H&H dropped.  Antiplatelets held.  Plan for EGD.  BP 126/77   Pulse 69   Temp 99.1 F (37.3 C)   Resp 13   Ht 5\' 8"  (1.727 m)   Wt 59.2 kg   SpO2 99%   BMI 19.84 kg/m   Dental: Resting in bed no distress Heart: Regular rate rhythm, S1-S2 Lungs: Clear to auscultation bilaterally, no crackles no wheeze Abdomen: Soft nontender nondistended  Labs reviewed H&H stable  Assessment:  Acute GI bleed Presumed upper GI bleeding Acute blood loss anemia Hemorrhagic shock, resolved secondary to above Peripheral ocular  disease  Plan: Stable for transfer from ICU H&H followed stable EGD plans per gastroenterology After GI evaluation possible left heart catheterization  Appreciate GI and cardiology input.  Discussed with [facility] service and they are in agreements for transfer from the ICU and will pick up service tomorrow.  Garner Nash, DO [facility] 09/29/2019 11:06 AM

## 2019-09-29 NOTE — Interval H&P Note (Signed)
History and Physical Interval Note:  09/29/2019 1:33 PM  Raymond Carney  has presented today for surgery, with the diagnosis of GI bleed.  The various methods of treatment have been discussed with the patient and family. After consideration of risks, benefits and other options for treatment, the patient has consented to  Procedure(s): ESOPHAGOGASTRODUODENOSCOPY (EGD) WITH PROPOFOL (N/A) as a surgical intervention.  The patient's history has been reviewed, patient examined, no change in status, stable for surgery.  I have reviewed the patient's chart and labs.  Questions were answered to the patient's satisfaction.     [place]

## 2019-09-29 NOTE — Anesthesia Procedure Notes (Signed)
Procedure Name: MAC Date/Time: 09/29/2019 1:45 PM Performed by: Alain Marion, CRNA Pre-anesthesia Checklist: Patient identified, Emergency Drugs available, Suction available and Patient being monitored Oxygen Delivery Method: Nasal cannula Placement Confirmation: positive ETCO2

## 2019-09-29 NOTE — Op Note (Signed)
[facility] Patient Name: Raymond Carney Procedure Date : 09/29/2019 MRN: [id number] Attending MD: Carlota Raspberry. Havery Moros , MD Date of Birth: 05-03-1961 CSN: [id number] Age: 58 Admit Type: Inpatient Procedure:                Upper GI endoscopy Indications:              Melena, recent suspected upper gastrointestinal                            bleeding in the setting of aspirin and plavix Providers:                Carlota Raspberry. Havery Moros, MD, Ashley Jacobs, RN, Lazaro Arms, Technician Referring MD:              Medicines:                Monitored Anesthesia Care Complications:            No immediate complications. Estimated blood loss:                            Minimal. Estimated Blood Loss:     Estimated blood loss was minimal. Procedure:                Pre-Anesthesia Assessment:                           - Prior to the procedure, a History and Physical                            was performed, and patient medications and                            allergies were reviewed. The patient's tolerance of                            previous anesthesia was also reviewed. The risks                            and benefits of the procedure and the sedation                            options and risks were discussed with the patient.                            All questions were answered, and informed consent                            was obtained. Prior Anticoagulants: The patient has                            taken Plavix (clopidogrel), last dose was 2 days  prior to procedure. ASA Grade Assessment: III - A                            patient with severe systemic disease. After                            reviewing the risks and benefits, the patient was                            deemed in satisfactory condition to undergo the                            procedure.                           After obtaining informed consent, the  endoscope was                            passed under direct vision. Throughout the                            procedure, the patient's blood pressure, pulse, and                            oxygen saturations were monitored continuously. The                            [id number]) Olympus gastroscope was                            introduced through the mouth, and advanced to the                            second part of duodenum. The upper GI endoscopy was                            accomplished without difficulty. The patient                            tolerated the procedure well. Scope In: Scope Out: Findings:      Esophagogastric landmarks were identified: the Z-line was found at 43       cm, the gastroesophageal junction was found at 43 cm and the upper       extent of the gastric folds was found at 44 cm from the incisors.      A 1 cm hiatal hernia was present.      The exam of the esophagus was otherwise normal.      Two non-bleeding cratered gastric ulcers with no stigmata of bleeding       were found in the gastric antrum. One was quite large, at least a few cm       in size, which was mostly clean based (one diminutive red spot) without       any significant stigmata for bleeding, the other a few mm in size. No  heme in the stomach.      The exam of the stomach was otherwise normal.      The duodenal bulb and second portion of the duodenum were normal. Impression:               - Esophagogastric landmarks identified.                           - 1 cm hiatal hernia.                           - Normal esophagus otherwise                           - 2 gastric ulcers, both clean based as above, one                            quite large and the cause for the patient's recent                            bleeding.                           - Normal stomach otherwise                           - Normal duodenal bulb and second portion of the                             duodenum. Recommendation:           - Return patient to hospital ward for ongoing care.                           - Clear liquid diet okay now.                           - Continue present medications (IV protonix 40mg                             twice daily)                           - Continue to hold Plavix for now while we await                            his course given recent significant bleeding                            yesterday                           - Proceed with cardiac evaluation per cardiology.                            Patient is at risk for rebleeding given the size of  the ulcer for the next several days if antiplatelet                            therapy is resumed in the immediate future.                            Hopefully risk is minimized with PPI therapy at                            this time                           - H pylori IgG serology, treat if positive. Avoid                            all other NSAIDs                           - GI service will continue to follow, call with                            questions Procedure Code(s):        --- Professional ---                           [phone], Esophagogastroduodenoscopy, flexible,                            transoral; diagnostic, including collection of                            specimen(s) by brushing or washing, when performed                            (separate procedure) Diagnosis Code(s):        --- Professional ---                           K44.9, Diaphragmatic hernia without obstruction or                            gangrene                           K25.9, Gastric ulcer, unspecified as acute or                            chronic, without hemorrhage or perforation                           K92.1, Melena (includes Hematochezia)                           K92.2, Gastrointestinal hemorrhage, unspecified CPT copyright 2019 American Medical Association. All rights  reserved. The codes documented in this report are preliminary and upon coder review may  be revised to meet current compliance requirements. Remo Lipps  Daiva Huge, MD 09/29/2019 2:08:33 PM This report has been signed electronically. Number of Addenda: 0

## 2019-09-30 ENCOUNTER — Inpatient Hospital Stay (HOSPITAL_COMMUNITY)
Admission: EM | Disposition: A | Discharge status: Home / Self Care | Payer: Self-pay | Source: Home / Self Care | Attending: Internal Medicine

## 2019-09-30 ENCOUNTER — Encounter (HOSPITAL_COMMUNITY): Payer: Self-pay | Admitting: Gastroenterology

## 2019-09-30 DIAGNOSIS — I214 Non-ST elevation (NSTEMI) myocardial infarction: Secondary | ICD-10-CM | POA: Diagnosis not present

## 2019-09-30 DIAGNOSIS — K922 Gastrointestinal hemorrhage, unspecified: Secondary | ICD-10-CM | POA: Diagnosis not present

## 2019-09-30 DIAGNOSIS — I2510 Atherosclerotic heart disease of native coronary artery without angina pectoris: Secondary | ICD-10-CM

## 2019-09-30 DIAGNOSIS — K250 Acute gastric ulcer with hemorrhage: Secondary | ICD-10-CM | POA: Diagnosis not present

## 2019-09-30 HISTORY — PX: LEFT HEART CATH AND CORONARY ANGIOGRAPHY: CATH118249

## 2019-09-30 LAB — BASIC METABOLIC PANEL
Anion gap: 10 (ref 5–15)
BUN: 9 mg/dL (ref 6–20)
CO2: 23 mmol/L (ref 22–32)
Calcium: 8.2 mg/dL — ABNORMAL LOW (ref 8.9–10.3)
Chloride: 105 mmol/L (ref 98–111)
Creatinine, Ser: 0.85 mg/dL (ref 0.61–1.24)
GFR calc Af Amer: >60 mL/min (ref >60)
GFR calc non Af Amer: >60 mL/min (ref >60)
Glucose, Bld: 87 mg/dL (ref 70–99)
Potassium: 3.6 mmol/L (ref 3.5–5.1)
Sodium: 138 mmol/L (ref 135–145)

## 2019-09-30 LAB — CBC
HCT: 34.6 % — ABNORMAL LOW (ref 39.0–52.0)
Hemoglobin: 12.1 g/dL — ABNORMAL LOW (ref 13.0–17.0)
MCH: 30.6 pg (ref 26.0–34.0)
MCHC: 35 g/dL (ref 30.0–36.0)
MCV: 87.6 fL (ref 80.0–100.0)
Platelets: 199 10*3/uL (ref 150–400)
RBC: 3.95 MIL/uL — ABNORMAL LOW (ref 4.22–5.81)
RDW: 14.6 % (ref 11.5–15.5)
WBC: 10.9 10*3/uL — ABNORMAL HIGH (ref 4.0–10.5)
nRBC: 0 % (ref 0.0–0.2)

## 2019-09-30 LAB — GLUCOSE, CAPILLARY
Glucose-Capillary: 78 mg/dL (ref 70–99)
Glucose-Capillary: 89 mg/dL (ref 70–99)
Glucose-Capillary: 113 mg/dL — ABNORMAL HIGH (ref 70–99)
Glucose-Capillary: 81 mg/dL (ref 70–99)
Glucose-Capillary: 229 mg/dL — ABNORMAL HIGH (ref 70–99)
Glucose-Capillary: 145 mg/dL — ABNORMAL HIGH (ref 70–99)

## 2019-09-30 SURGERY — LEFT HEART CATH AND CORONARY ANGIOGRAPHY
Anesthesia: LOCAL

## 2019-09-30 MED ORDER — SODIUM CHLORIDE 0.9% FLUSH: 3.0000 mL | INTRAVENOUS | Status: DC | PRN

## 2019-09-30 MED ORDER — HYDRALAZINE HCL 20 MG/ML IJ SOLN: 10.0000 mg | INTRAMUSCULAR | Status: AC | PRN

## 2019-09-30 MED ORDER — SODIUM CHLORIDE 0.9 % IV SOLN
INTRAVENOUS | Status: AC | PRN
  Administered 2019-09-30: 10 mL/h via INTRAVENOUS

## 2019-09-30 MED ORDER — SODIUM CHLORIDE 0.9 % IV SOLN: INTRAVENOUS | Status: AC

## 2019-09-30 MED ORDER — ACETAMINOPHEN 325 MG PO TABS: 650.0000 mg | ORAL_TABLET | ORAL | Status: DC | PRN

## 2019-09-30 MED ORDER — HEPARIN SODIUM (PORCINE) 5000 UNIT/ML IJ SOLN
5000.0000 [IU] | Freq: Three times a day (TID) | INTRAMUSCULAR | Status: DC
  Administered 2019-09-30 – 2019-10-01 (×2): 5000 [IU] via SUBCUTANEOUS
  Filled 2019-09-30 (×3): qty 1

## 2019-09-30 MED ORDER — ONDANSETRON HCL 4 MG/2ML IJ SOLN: 4.0000 mg | Freq: Four times a day (QID) | INTRAMUSCULAR | Status: DC | PRN

## 2019-09-30 MED ORDER — LIDOCAINE HCL (PF) 1 % IJ SOLN
INTRAMUSCULAR | Status: AC
  Filled 2019-09-30: qty 30

## 2019-09-30 MED ORDER — VERAPAMIL HCL 2.5 MG/ML IV SOLN
INTRAVENOUS | Status: AC
  Filled 2019-09-30: qty 2

## 2019-09-30 MED ORDER — HEPARIN (PORCINE) IN NACL 1000-0.9 UT/500ML-% IV SOLN
INTRAVENOUS | Status: AC
  Filled 2019-09-30: qty 1000

## 2019-09-30 MED ORDER — HEPARIN (PORCINE) IN NACL 1000-0.9 UT/500ML-% IV SOLN
INTRAVENOUS | Status: DC | PRN
  Administered 2019-09-30 (×2): 500 mL

## 2019-09-30 MED ORDER — LIDOCAINE HCL (PF) 1 % IJ SOLN
INTRAMUSCULAR | Status: DC | PRN
  Administered 2019-09-30: 4 mL
  Administered 2019-09-30: 10 mL

## 2019-09-30 MED ORDER — SODIUM CHLORIDE 0.9% FLUSH
3.0000 mL | Freq: Two times a day (BID) | INTRAVENOUS | Status: DC
  Administered 2019-10-01 (×2): 3 mL via INTRAVENOUS

## 2019-09-30 MED ORDER — SODIUM CHLORIDE 0.9 % IV SOLN: 250.0000 mL | INTRAVENOUS | Status: DC | PRN

## 2019-09-30 MED ORDER — LABETALOL HCL 5 MG/ML IV SOLN: 10.0000 mg | INTRAVENOUS | Status: AC | PRN

## 2019-09-30 MED ORDER — IOHEXOL 350 MG/ML SOLN
INTRAVENOUS | Status: DC | PRN
  Administered 2019-09-30: 80 mL

## 2019-09-30 MED ORDER — SODIUM CHLORIDE 0.9 % IV SOLN
INTRAVENOUS | Status: DC
  Administered 2019-09-30: 11:00:00 via INTRAVENOUS

## 2019-09-30 MED ORDER — OXYCODONE HCL 5 MG PO TABS: 5.0000 mg | ORAL_TABLET | ORAL | Status: DC | PRN

## 2019-09-30 SURGICAL SUPPLY — 14 items
CATH INFINITI 5FR JL4 (CATHETERS) IMPLANT
CATH INFINITI 5FR MPB2 (CATHETERS) ×1 IMPLANT
CATH INFINITI JR4 5F (CATHETERS) ×1 IMPLANT
GLIDESHEATH SLEND A-KIT 6F 22G (SHEATH) ×1 IMPLANT
GUIDEWIRE INQWIRE 1.5J.035X260 (WIRE) IMPLANT
INQWIRE 1.5J .035X260CM (WIRE) ×2
KIT HEART LEFT (KITS) ×2 IMPLANT
PACK CARDIAC CATHETERIZATION (CUSTOM PROCEDURE TRAY) ×2 IMPLANT
SHEATH PINNACLE 5F 10CM (SHEATH) ×1 IMPLANT
SHEATH PROBE COVER 6X72 (BAG) ×1 IMPLANT
SYR 10CC CONTROL (SYRINGE) ×1 IMPLANT
TRANSDUCER W/STOPCOCK (MISCELLANEOUS) ×2 IMPLANT
TUBING CIL FLEX 10 FLL-RA (TUBING) ×2 IMPLANT
WIRE EMERALD 3MM-J .035X150CM (WIRE) ×1 IMPLANT

## 2019-09-30 NOTE — Progress Notes (Signed)
Cardiology Progress Note  Patient ID: MARGUETTE CONDRY MRN: [id number] DOB: 1961-10-15 Date of Encounter: 09/30/2019  Primary Cardiologist: Candee Furbish, MD  Subjective  No chest pain. Tele remains sinus brady.   ROS:  All other ROS reviewed and negative. Pertinent positives noted in the HPI.     Inpatient Medications  Scheduled Meds: . sodium chloride   Intravenous Once  . sodium chloride   Intravenous Once  . Chlorhexidine Gluconate Cloth  6 each Topical Daily  . insulin aspart  0-9 Units Subcutaneous Q4H  . mouth rinse  15 mL Mouth Rinse BID  . pantoprazole (PROTONIX) IV  40 mg Intravenous Q12H   Continuous Infusions: . sodium chloride    . dextrose 5 % and 0.9% NaCl 50 mL/hr at 09/30/19 0624   PRN Meds: acetaminophen, fentaNYL (SUBLIMAZE) injection, ondansetron (ZOFRAN) IV   Vital Signs   Vitals:   09/30/19 0437 09/30/19 0627 09/30/19 0705 09/30/19 0759  BP:  90/67 90/67 98/78   Pulse:  61 (!) 57 60  Resp:  13 17 19   Temp:    97.8 F (36.6 C)  TempSrc:    Oral  SpO2:  97% 96% 95%  Weight: 58.7 kg     Height:        Intake/Output Summary (Last 24 hours) at 09/30/2019 0833 Last data filed at 09/30/2019 0700 Gross per 24 hour  Intake 1689.93 ml  Output 3425 ml  Net -1735.07 ml   Last 3 Weights 09/30/2019 09/29/2019 09/29/2019  Weight (lbs) 129 lb 6.6 oz 130 lb 8.2 oz 130 lb 8.2 oz  Weight (kg) 58.7 kg 59.2 kg 59.2 kg      Telemetry  Overnight telemetry shows sinus bradycardia ~60 bpm, which I personally reviewed.   Physical Exam   Vitals:   09/30/19 0437 09/30/19 0627 09/30/19 0705 09/30/19 0759  BP:  90/67 90/67 98/78   Pulse:  61 (!) 57 60  Resp:  13 17 19   Temp:    97.8 F (36.6 C)  TempSrc:    Oral  SpO2:  97% 96% 95%  Weight: 58.7 kg     Height:         Intake/Output Summary (Last 24 hours) at 09/30/2019 0833 Last data filed at 09/30/2019 0700 Gross per 24 hour  Intake 1689.93 ml  Output 3425 ml  Net -1735.07 ml    Last 3 Weights 09/30/2019  09/29/2019 09/29/2019  Weight (lbs) 129 lb 6.6 oz 130 lb 8.2 oz 130 lb 8.2 oz  Weight (kg) 58.7 kg 59.2 kg 59.2 kg    Body mass index is 19.68 kg/m.  General: Well nourished, well developed, in no acute distress Head: Atraumatic, normal size  Eyes: PEERLA, EOMI  Neck: Supple, no JVD Endocrine: No thryomegaly Cardiac: Normal S1, S2; RRR; no murmurs, rubs, or gallops Lungs: Clear to auscultation bilaterally, no wheezing, rhonchi or rales  Abd: Soft, nontender, no hepatomegaly  Ext: No edema, pulses 2+ Musculoskeletal: No deformities, BUE and BLE strength normal and equal Skin: Warm and dry, no rashes   Neuro: Alert and oriented to person, place, time, and situation, CNII-XII grossly intact, no focal deficits  Psych: Normal mood and affect   Labs  High Sensitivity Troponin:   Recent Labs  Lab 09/27/19 2126 09/27/19 2349 09/28/19 0324 09/28/19 0639 09/28/19 0937  TROPONINIHS 3,542* 6,462* [id number]* 9,635* 8,693*     Cardiac EnzymesNo results for input(s): TROPONINI in the last 168 hours. No results for input(s): TROPIPOC in the last 168 hours.  Chemistry  Recent Labs  Lab 09/27/19 1521  09/28/19 0324 09/29/19 0524 09/30/19 0053  NA 137   < > 137 133* 138  K 3.9   < > 3.3* 3.4* 3.6  CL 103   < > 110 103 105  CO2 15*  --  19* 26 23  GLUCOSE 128*   < > 121* 91 87  BUN 45*   < > 26* 12 9  CREATININE 1.15   < > 0.78 0.73 0.85  CALCIUM 8.7*  --  7.7* 8.1* 8.2*  PROT 5.5*  --   --  4.7*  --   ALBUMIN 3.4*  --   --  2.6*  --   AST 19  --   --  43*  --   ALT 10  --   --  17  --   ALKPHOS 31*  --   --  36*  --   BILITOT 0.5  --   --  0.8  --   GFRNONAA >60  --  >60 >60 >60  GFRAA >60  --  >60 >60 >60  ANIONGAP 19*  --  8 4* 10   < > = values in this interval not displayed.    Hematology Recent Labs  Lab 09/28/19 0324  09/29/19 0524 09/29/19 1200 09/30/19 0053  WBC 23.4*  --  15.3*  --  10.9*  RBC 3.87*  --  3.71*  --  3.95*  HGB 11.7*   < > 11.4* 11.4* 12.1*  HCT  33.5*   < > 32.3* 33.0* 34.6*  MCV 86.6  --  87.1  --  87.6  MCH 30.2  --  30.7  --  30.6  MCHC 34.9  --  35.3  --  35.0  RDW 13.9  --  14.4  --  14.6  PLT 172  --  172  --  199   < > = values in this interval not displayed.   BNPNo results for input(s): BNP, PROBNP in the last 168 hours.  DDimer No results for input(s): DDIMER in the last 168 hours.   Radiology  No results found.  Cardiac Studies  TTE 09/28/2019  1. Left ventricular ejection fraction, by visual estimation, is 50 to 55%. The left ventricle has normal function. There is moderately increased left ventricular hypertrophy.  2. Left ventricular diastolic parameters are consistent with Grade II diastolic dysfunction (pseudonormalization).  3. Global right ventricle has normal systolic function.The right ventricular size is normal.  4. Left atrial size was normal.  5. Right atrial size was normal.  6. The mitral valve is normal in structure. Mild mitral valve regurgitation. No evidence of mitral stenosis.  7. The tricuspid valve is normal in structure. Tricuspid valve regurgitation is mild.  8. The aortic valve is tricuspid. Aortic valve regurgitation is not visualized. Mild aortic valve sclerosis without stenosis.  9. The pulmonic valve was normal in structure. Pulmonic valve regurgitation is not visualized. 10. Mildly elevated pulmonary artery systolic pressure. 11. The inferior vena cava is normal in size with greater than 50% respiratory variability, suggesting right atrial pressure of 3 mmHg. 12. Hypokinesis of the inferolateral wall with overall preserved LV function; moderate LVH; grade 2 diastolic dysfunction; mild MR and TR; mildly elevated pulmonary pressure.  Patient Profile  DEVAL MCFARREN is a 58 y.o. 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  Assessment & Plan  1. Type 2 MI: Admitted with profound anemia  and had diffuse ST depression  and ST elevation in AVR concerning for global ischemia. Troponin peaked around 9000. No chest pain now or then. EGD with non-bleeding ulcers. TEE with low normal EF and WMA. We will proceed with LHC today to exclude left main disease and severe 3v CAD that may benefit from surgical revascularization. He cannot be on plavix and we will not pursue any PCI. I think given his alarming EKG changes with his anemia this is warranted. I discussed the purpose of the procedure with him and the risks/benefits. He is ok to proceed with LHC. We will need to get him on aspirin 81 mg pending GI approval.    For questions or updates, please contact [place] Please consult [email or web address] for contact info under        Signed, [place] T. Audie Box, [place]  09/30/2019 8:33 AM

## 2019-09-30 NOTE — Interval H&P Note (Signed)
Cath Lab Visit (complete for each Cath Lab visit)  Clinical Evaluation Leading to the Procedure:   ACS: Yes.    Non-ACS:    Anginal Classification: CCS II  Anti-ischemic medical therapy: Minimal Therapy (1 class of medications)  Non-Invasive Test Results: No non-invasive testing performed  Prior CABG: No previous CABG      History and Physical Interval Note:  09/30/2019 2:03 PM  Raymond Carney  has presented today for surgery, with the diagnosis of unstable angina.  The various methods of treatment have been discussed with the patient and family. After consideration of risks, benefits and other options for treatment, the patient has consented to  Procedure(s): LEFT HEART CATH AND CORONARY ANGIOGRAPHY (N/A) as a surgical intervention.  The patient's history has been reviewed, patient examined, no change in status, stable for surgery.  I have reviewed the patient's chart and labs.  Questions were answered to the patient's satisfaction.     Belva Crome III

## 2019-09-30 NOTE — Progress Notes (Signed)
Progress Note    Raymond Carney  [id number] DOB: Aug 10, 1961  DOA: 09/27/2019 PCP: Antony Contras, MD    Brief Narrative:     Medical records reviewed and are as summarized below:  Raymond Carney is an 58 y.o. male with history of peripheral vascular disease presenting with worsening left leg pain.  Left leg noted to be pale and cold to touch, sent for CTA and evaluated by vascular who feel this is a more chronic femoral occlusion that does not need immediate attention.  On way back from CT, patient had seizure like episode and became severely hypotensive.  H/H noted to be 6 g/dL from 13 two months ago.  Patient is on ibuprofen, dual antiplatelet therapy at home.  Has been having black stool unclear when started, no hematochezia, hematemesis.  No history of GIB.  No history of seizures.  PCCM asked to admit for possible seizure and acute hemorrhagic shock.  Assessment/Plan:   Active Problems:   Acute GI bleeding   Hemorrhagic shock (HCC)   Seizure (HCC)   NSTEMI (non-ST elevated myocardial infarction) ([place])   Acute gastric ulcer with hemorrhage   Upper GI bleed     hemorrhagic shock- resolved S/p 4 units pRBC given in ER GI consult with EGD: 11/30: 2 clean-based gastric ulcers 1 of which was large and likely culprit for bleed.  Size of ulcer makes for higher risk of re-bleeding in next several days.   Protonix 40 iv bid i Serum H Pylori IgG is pending  ? 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   Family Communication/Anticipated D/C date and plan/Code Status   DVT prophylaxis: scd Code Status: Full Code.  Family Communication: spoke with  sister Disposition Plan:    Medical Consultants:    PCCM  cardiology  Subjective:   Going for heart cath later today Wants me to call his sister  Objective:    Vitals:   09/30/19 0627 09/30/19 0705 09/30/19 0759 09/30/19 1204  BP: 90/67 90/67 98/78    Pulse: 61 (!) 57 60   Resp: 13 17 19    Temp:   97.8 F (36.6 C) 97.6 F (36.4 C)  TempSrc:   Oral Oral  SpO2: 97% 96% 95%   Weight:      Height:        Intake/Output Summary (Last 24 hours) at 09/30/2019 1400 Last data filed at 09/30/2019 1324 Gross per 24 hour  Intake 1499.89 ml  Output 3525 ml  Net -2025.11 ml   Filed Weights   09/29/19 0500 09/29/19 1245 09/30/19 0437  Weight: 59.2 kg 59.2 kg 58.7 kg    Exam: In bed, pleasant and cooperative rrr No increased work of breathing No LE Edema  Data Reviewed:   I have personally reviewed following labs and imaging studies:  Labs: Labs show the following:   Basic Metabolic Panel: Recent Labs  Lab 09/27/19 1521 09/27/19 1526 09/28/19 0324 09/29/19 0524 09/30/19 0053  NA 137 135 137 133* 138  K 3.9 4.1 3.3* 3.4* 3.6  CL 103 103 110 103 105  CO2 15*  --  19* 26 23  GLUCOSE 128* 123* 121* 91 87  BUN 45* 47* 26* 12 9  CREATININE 1.15 1.00 0.78 0.73 0.85  CALCIUM 8.7*  --  7.7* 8.1* 8.2*  MG  --   --  1.9 2.0  --   PHOS  --   --  2.3*  --   --    GFR Estimated Creatinine Clearance: 76.7 mL/min (by C-G formula based on SCr of 0.85 mg/dL). Liver Function Tests: Recent Labs  Lab 09/27/19 1521 09/29/19 0524  AST 19 43*  ALT 10 17  ALKPHOS 31* 36*  BILITOT 0.5 0.8  PROT 5.5* 4.7*  ALBUMIN 3.4* 2.6*   No results for input(s): LIPASE, AMYLASE in the last 168 hours. No results for input(s): AMMONIA in the last 168 hours. Coagulation profile Recent Labs  Lab 09/27/19 1521 09/28/19 0850  INR 1.3* 1.2    CBC: Recent Labs  Lab 09/27/19 1521  09/27/19 2126 09/28/19 0324  09/28/19 1723 09/28/19 2347 09/29/19 0524 09/29/19 1200 09/30/19  0053  WBC 19.3*  --  24.9* 23.4*  --   --   --  15.3*  --  10.9*  NEUTROABS 15.4*  --   --   --   --   --   --   --   --   --   HGB 6.7*   < > 12.6* 11.7*   < > 11.1* 11.0* 11.4* 11.4* 12.1*  HCT 21.2*   < > 35.4* 33.5*   < > 31.3* 30.6* 32.3* 33.0* 34.6*  MCV 92.6  --  85.7 86.6  --   --   --  87.1  --  87.6  PLT 259  --  167 172  --   --   --  172  --  199   < > = values in this interval not displayed.   Cardiac Enzymes: No results for input(s): CKTOTAL, CKMB, CKMBINDEX, TROPONINI in the last 168 hours. BNP (last 3 results) No results for input(s): PROBNP in the last 8760 hours. CBG: Recent Labs  Lab 09/29/19 2020 09/30/19 0109 09/30/19 0436 09/30/19 0800 09/30/19 1203  GLUCAP 82 78 89 113* 81   D-Dimer: No results for input(s): DDIMER in the last 72 hours. Hgb A1c: No results for input(s): HGBA1C in the last 72 hours. Lipid Profile: No results for input(s): CHOL, HDL, LDLCALC, TRIG, CHOLHDL, LDLDIRECT in the last 72 hours. Thyroid function studies: No results for input(s): TSH, T4TOTAL, T3FREE, THYROIDAB in the last 72 hours.  Invalid input(s): FREET3 Anemia work up: No results for input(s): VITAMINB12, FOLATE, FERRITIN, TIBC, IRON, RETICCTPCT in the last 72 hours. Sepsis Labs: Recent Labs  Lab 09/27/19 1521 09/27/19 1818 09/27/19 2126 09/27/19 2139 09/28/19 0214 09/28/19 0324 09/29/19 0524 09/30/19 0053  PROCALCITON  --   --   --   --  <0.10  --   --   --   WBC 19.3*  --  24.9*  --   --  23.4* 15.3* 10.9*  LATICACIDVEN 9.7* 4.9*  --  1.0  --   --   --   --     Microbiology Recent Results (from the past 240 hour(s))  SARS Coronavirus 2 by RT PCR (hospital order, performed in [place] hospital lab) Nasopharyngeal Nasopharyngeal Swab     Status: None   Collection Time: 09/27/19  3:16 PM   Specimen: Nasopharyngeal Swab  Result Value Ref Range Status   SARS Coronavirus 2 NEGATIVE NEGATIVE Final    Comment: (NOTE) SARS-CoV-2 target nucleic acids are NOT  DETECTED. The  SARS-CoV-2 RNA is generally detectable in upper and lower respiratory specimens during the acute phase of infection. The lowest concentration of SARS-CoV-2 viral copies this assay can detect is 250 copies / mL. A negative result does not preclude SARS-CoV-2 infection and should not be used as the sole basis for treatment or other patient management decisions.  A negative result may occur with improper specimen collection / handling, submission of specimen other than nasopharyngeal swab, presence of viral mutation(s) within the areas targeted by this assay, and inadequate number of viral copies (<250 copies / mL). A negative result must be combined with clinical observations, patient history, and epidemiological information. Fact Sheet for Patients:   StrictlyIdeas.no Fact Sheet for Healthcare Providers: BankingDealers.co.za This test is not yet approved or cleared  by the [place] FDA and has been authorized for detection and/or diagnosis of SARS-CoV-2 by FDA under an Emergency Use Authorization (EUA).  This EUA will remain in effect (meaning this test can be used) for the duration of the COVID-19 declaration under Section 564(b)(1) of the Act, 21 U.S.C. section 360bbb-3(b)(1), unless the authorization is terminated or revoked sooner. Performed at [facility], [place] [address] [place], [place] [address]   MRSA PCR Screening     Status: None   Collection Time: 09/27/19  8:29 PM   Specimen: Nasal Mucosa; Nasopharyngeal  Result Value Ref Range Status   MRSA by PCR NEGATIVE NEGATIVE Final    Comment:        The GeneXpert MRSA Assay (FDA approved for NASAL specimens only), is one component of a comprehensive MRSA colonization surveillance program. It is not intended to diagnose MRSA infection nor to guide or monitor treatment for MRSA infections. Performed at [facility], [place] [address],  [place], [place] [address]     Procedures and diagnostic studies:  No results found.  Medications:   . Chlorhexidine Gluconate Cloth  6 each Topical Daily  . insulin aspart  0-9 Units Subcutaneous Q4H  . mouth rinse  15 mL Mouth Rinse BID  . pantoprazole (PROTONIX) IV  40 mg Intravenous Q12H   Continuous Infusions: . sodium chloride 250 mL (09/30/19 1002)  . sodium chloride 150 mL/hr at 09/30/19 1112  . dextrose 5 % and 0.9% NaCl 50 mL/hr at 09/30/19 [id number]     LOS: 3 days   Geradine Girt  [facility]   How to contact the [facility] Attending or Consulting provider [place] or covering provider during after hours [place], for this patient?  1. Check the care team in [facility] and look for a) attending/consulting [facility] provider listed and b) the [facility] team listed 2. Log into [email or web address] and use [place]'s universal password to access. If you do not have the password, please contact the hospital operator. 3. Locate the [facility] provider you are looking for under [facility] and page to a number that you can be directly reached. 4. If you still have difficulty reaching the provider, please page the [facility] (Director on Call) for the Hospitalists listed on amion for assistance.  09/30/2019, 2:00 PM

## 2019-09-30 NOTE — Care Management Important Message (Signed)
Important Message  Patient Details  Name: Raymond Carney MRN: [id number] Date of Birth: 12/10/1960   Medicare Important Message Given:  Yes     Jaria Conway Montine Circle 09/30/2019, 12:56 PM

## 2019-09-30 NOTE — H&P (View-Only) (Signed)
Cardiology Progress Note  Patient ID: Raymond Carney MRN: [id number] DOB: 1960/11/08 Date of Encounter: 09/30/2019  Primary Cardiologist: Candee Furbish, MD  Subjective  No chest pain. Tele remains sinus brady.   ROS:  All other ROS reviewed and negative. Pertinent positives noted in the HPI.     Inpatient Medications  Scheduled Meds: . sodium chloride   Intravenous Once  . sodium chloride   Intravenous Once  . Chlorhexidine Gluconate Cloth  6 each Topical Daily  . insulin aspart  0-9 Units Subcutaneous Q4H  . mouth rinse  15 mL Mouth Rinse BID  . pantoprazole (PROTONIX) IV  40 mg Intravenous Q12H   Continuous Infusions: . sodium chloride    . dextrose 5 % and 0.9% NaCl 50 mL/hr at 09/30/19 0624   PRN Meds: acetaminophen, fentaNYL (SUBLIMAZE) injection, ondansetron (ZOFRAN) IV   Vital Signs   Vitals:   09/30/19 0437 09/30/19 0627 09/30/19 0705 09/30/19 0759  BP:  90/67 90/67 98/78   Pulse:  61 (!) 57 60  Resp:  13 17 19   Temp:    97.8 F (36.6 C)  TempSrc:    Oral  SpO2:  97% 96% 95%  Weight: 58.7 kg     Height:        Intake/Output Summary (Last 24 hours) at 09/30/2019 0833 Last data filed at 09/30/2019 0700 Gross per 24 hour  Intake 1689.93 ml  Output 3425 ml  Net -1735.07 ml   Last 3 Weights 09/30/2019 09/29/2019 09/29/2019  Weight (lbs) 129 lb 6.6 oz 130 lb 8.2 oz 130 lb 8.2 oz  Weight (kg) 58.7 kg 59.2 kg 59.2 kg      Telemetry  Overnight telemetry shows sinus bradycardia ~60 bpm, which I personally reviewed.   Physical Exam   Vitals:   09/30/19 0437 09/30/19 0627 09/30/19 0705 09/30/19 0759  BP:  90/67 90/67 98/78   Pulse:  61 (!) 57 60  Resp:  13 17 19   Temp:    97.8 F (36.6 C)  TempSrc:    Oral  SpO2:  97% 96% 95%  Weight: 58.7 kg     Height:         Intake/Output Summary (Last 24 hours) at 09/30/2019 0833 Last data filed at 09/30/2019 0700 Gross per 24 hour  Intake 1689.93 ml  Output 3425 ml  Net -1735.07 ml    Last 3 Weights 09/30/2019  09/29/2019 09/29/2019  Weight (lbs) 129 lb 6.6 oz 130 lb 8.2 oz 130 lb 8.2 oz  Weight (kg) 58.7 kg 59.2 kg 59.2 kg    Body mass index is 19.68 kg/m.  General: Well nourished, well developed, in no acute distress Head: Atraumatic, normal size  Eyes: PEERLA, EOMI  Neck: Supple, no JVD Endocrine: No thryomegaly Cardiac: Normal S1, S2; RRR; no murmurs, rubs, or gallops Lungs: Clear to auscultation bilaterally, no wheezing, rhonchi or rales  Abd: Soft, nontender, no hepatomegaly  Ext: No edema, pulses 2+ Musculoskeletal: No deformities, BUE and BLE strength normal and equal Skin: Warm and dry, no rashes   Neuro: Alert and oriented to person, place, time, and situation, CNII-XII grossly intact, no focal deficits  Psych: Normal mood and affect   Labs  High Sensitivity Troponin:   Recent Labs  Lab 09/27/19 2126 09/27/19 2349 09/28/19 0324 09/28/19 0639 09/28/19 0937  TROPONINIHS 3,542* 6,462* [id number]* 9,635* 8,693*     Cardiac EnzymesNo results for input(s): TROPONINI in the last 168 hours. No results for input(s): TROPIPOC in the last 168 hours.  Chemistry  Recent Labs  Lab 09/27/19 1521  09/28/19 0324 09/29/19 0524 09/30/19 0053  NA 137   < > 137 133* 138  K 3.9   < > 3.3* 3.4* 3.6  CL 103   < > 110 103 105  CO2 15*  --  19* 26 23  GLUCOSE 128*   < > 121* 91 87  BUN 45*   < > 26* 12 9  CREATININE 1.15   < > 0.78 0.73 0.85  CALCIUM 8.7*  --  7.7* 8.1* 8.2*  PROT 5.5*  --   --  4.7*  --   ALBUMIN 3.4*  --   --  2.6*  --   AST 19  --   --  43*  --   ALT 10  --   --  17  --   ALKPHOS 31*  --   --  36*  --   BILITOT 0.5  --   --  0.8  --   GFRNONAA >60  --  >60 >60 >60  GFRAA >60  --  >60 >60 >60  ANIONGAP 19*  --  8 4* 10   < > = values in this interval not displayed.    Hematology Recent Labs  Lab 09/28/19 0324  09/29/19 0524 09/29/19 1200 09/30/19 0053  WBC 23.4*  --  15.3*  --  10.9*  RBC 3.87*  --  3.71*  --  3.95*  HGB 11.7*   < > 11.4* 11.4* 12.1*  HCT  33.5*   < > 32.3* 33.0* 34.6*  MCV 86.6  --  87.1  --  87.6  MCH 30.2  --  30.7  --  30.6  MCHC 34.9  --  35.3  --  35.0  RDW 13.9  --  14.4  --  14.6  PLT 172  --  172  --  199   < > = values in this interval not displayed.   BNPNo results for input(s): BNP, PROBNP in the last 168 hours.  DDimer No results for input(s): DDIMER in the last 168 hours.   Radiology  No results found.  Cardiac Studies  TTE 09/28/2019  1. Left ventricular ejection fraction, by visual estimation, is 50 to 55%. The left ventricle has normal function. There is moderately increased left ventricular hypertrophy.  2. Left ventricular diastolic parameters are consistent with Grade II diastolic dysfunction (pseudonormalization).  3. Global right ventricle has normal systolic function.The right ventricular size is normal.  4. Left atrial size was normal.  5. Right atrial size was normal.  6. The mitral valve is normal in structure. Mild mitral valve regurgitation. No evidence of mitral stenosis.  7. The tricuspid valve is normal in structure. Tricuspid valve regurgitation is mild.  8. The aortic valve is tricuspid. Aortic valve regurgitation is not visualized. Mild aortic valve sclerosis without stenosis.  9. The pulmonic valve was normal in structure. Pulmonic valve regurgitation is not visualized. 10. Mildly elevated pulmonary artery systolic pressure. 11. The inferior vena cava is normal in size with greater than 50% respiratory variability, suggesting right atrial pressure of 3 mmHg. 12. Hypokinesis of the inferolateral wall with overall preserved LV function; moderate LVH; grade 2 diastolic dysfunction; mild MR and TR; mildly elevated pulmonary pressure.  Patient Profile   Raymond Carney is a 58 y.o. 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  Assessment & Plan  1. Type 2 MI: Admitted with profound anemia  and had diffuse ST depression  and ST elevation in AVR concerning for global ischemia. Troponin peaked around 9000. No chest pain now or then. EGD with non-bleeding ulcers. TEE with low normal EF and WMA. We will proceed with LHC today to exclude left main disease and severe 3v CAD that may benefit from surgical revascularization. He cannot be on plavix and we will not pursue any PCI. I think given his alarming EKG changes with his anemia this is warranted. I discussed the purpose of the procedure with him and the risks/benefits. He is ok to proceed with LHC. We will need to get him on aspirin 81 mg pending GI approval.    For questions or updates, please contact [place] Please consult [email or web address] for contact info under        Signed, [place] T. Audie Box, [place]  09/30/2019 8:33 AM

## 2019-09-30 NOTE — Progress Notes (Signed)
Site area: right groin a 5 french arterial sheath was removed  Site Prior to Removal:  Level 0  Pressure Applied For 20 MINUTES    Bedrest Beginning at 1745p  Manual:   Yes.    Patient Status During Pull:  stable  Post Pull Groin Site:  Level 0  Post Pull Instructions Given:  Yes.    Post Pull Pulses Present:  Yes.    Dressing Applied:  Yes.    Comments:

## 2019-09-30 NOTE — CV Procedure (Signed)
   Left heart cath with coronary angiography via right femoral using real-time vascular ultrasound for access.  Left main is widely patent but heavily calcified  Eccentric tortuous 85% proximal LAD.  Followed by 85% mid LAD within a sharp bend.  Circumflex is totally occluded proximally filling late via left to left collaterals.  Dominant RCA with heavy calcification and within the mid segment 50 to 60% stenosis noted.  No high-grade obstruction seen.  Distal circumflex fills by collaterals.  Left coronary collaterals are much more plentiful to the circumflex.  Normal LV systolic function.  Normal LVEDP.

## 2019-09-30 NOTE — Progress Notes (Signed)
Daily Rounding Note  09/30/2019, 10:09 AM  LOS: 3 days   SUBJECTIVE:   Chief complaint:   GI bleed with melena. Feels a lot better.   No BM's or nausea.  No abd pain  OBJECTIVE:         Vital signs in last 24 hours:    Temp:  [97.7 F (36.5 C)-99.5 F (37.5 C)] 97.8 F (36.6 C) (12/01 0759) Pulse Rate:  [54-80] 60 (12/01 0759) Resp:  [8-25] 19 (12/01 0759) BP: (73-117)/(56-81) 98/78 (12/01 0759) SpO2:  [93 %-100 %] 95 % (12/01 0759) Weight:  [58.7 kg-59.2 kg] 58.7 kg (12/01 0437) Last BM Date: 09/27/19 Filed Weights   09/29/19 0500 09/29/19 1245 09/30/19 0437  Weight: 59.2 kg 59.2 kg 58.7 kg   General: looks well.  NAD   Heart: RRR Chest: no dyspnea or cough.  Clear bil.   Abdomen: soft, NT, ND.  Active BS  Extremities: no CCE Neuro/Psych:  Oriented x 3.  Fluid speech. No gross deficits.   Intake/Output from previous day: 11/30 0701 - 12/01 0700 In: 1739.9 [P.O.:720; I.V.:1019.9] Out: [id number] [Urine:3425]  Intake/Output this shift: Total I/O In: -  Out: 400 [Urine:400]  Lab Results: Recent Labs    09/28/19 0324  09/29/19 0524 09/29/19 1200 09/30/19 0053  WBC 23.4*  --  15.3*  --  10.9*  HGB 11.7*   < > 11.4* 11.4* 12.1*  HCT 33.5*   < > 32.3* 33.0* 34.6*  PLT 172  --  172  --  199   < > = values in this interval not displayed.   BMET Recent Labs    09/28/19 0324 09/29/19 0524 09/30/19 0053  NA 137 133* 138  K 3.3* 3.4* 3.6  CL 110 103 105  CO2 19* 26 23  GLUCOSE 121* 91 87  BUN 26* 12 9  CREATININE 0.78 0.73 0.85  CALCIUM 7.7* 8.1* 8.2*   LFT Recent Labs    09/27/19 1521 09/29/19 0524  PROT 5.5* 4.7*  ALBUMIN 3.4* 2.6*  AST 19 43*  ALT 10 17  ALKPHOS 31* 36*  BILITOT 0.5 0.8   PT/INR Recent Labs    09/27/19 1521 09/28/19 0850  LABPROT 15.6* 14.6  INR 1.3* 1.2   Hepatitis Panel No results for input(s): HEPBSAG, HCVAB, HEPAIGM, HEPBIGM in the last 72 hours.   Studies/Results: No results found.  Scheduled Meds: . Chlorhexidine Gluconate Cloth  6 each Topical Daily  . insulin aspart  0-9 Units Subcutaneous Q4H  . mouth rinse  15 mL Mouth Rinse BID  . pantoprazole (PROTONIX) IV  40 mg Intravenous Q12H   Continuous Infusions: . sodium chloride 250 mL (09/30/19 1002)  . dextrose 5 % and 0.9% NaCl 50 mL/hr at 09/30/19 0624   PRN Meds:.acetaminophen, fentaNYL (SUBLIMAZE) injection, ondansetron (ZOFRAN) IV  ASSESMENT:   *   Upper GI bleed with melena and patient on Plavix, aspirin, Ibuprofen (600 mg q 6 hours). 09/29/2019 EGD showing HH, 2 clean-based gastric ulcers 1 of which was large and likely culprit for bleed.  Size of ulcer makes for higher risk of re-bleeding in next several days.   Protonix 40 iv bid in place. Serum H Pylori IgG is pending  *   Chronic Plavix, low-dose aspirin for peripheral vascular disease, extra-cranial  cerebro-vascular dz.  On hold.  Last doses 09/27/19.   *  ABL anemia.   Hgb 6.1 >> 1 U PRBC >> 12.1  *  Type 2 MI.  For left heart cath today  *   Seizures.     PLAN   *    ? When to convert PPI to po? Await H pylori IgG and add antibiotics if indicated.     *   Resume clears after cath, ? Advance to full liquids or solid?    Raymond Carney  09/30/2019, 10:09 AM Phone [phone]

## 2019-10-01 ENCOUNTER — Encounter (HOSPITAL_COMMUNITY): Payer: Self-pay | Admitting: Interventional Cardiology

## 2019-10-01 DIAGNOSIS — I214 Non-ST elevation (NSTEMI) myocardial infarction: Secondary | ICD-10-CM | POA: Diagnosis not present

## 2019-10-01 DIAGNOSIS — K250 Acute gastric ulcer with hemorrhage: Secondary | ICD-10-CM | POA: Diagnosis not present

## 2019-10-01 LAB — LIPID PANEL
Cholesterol: 119 mg/dL (ref 0–200)
HDL: 27 mg/dL — ABNORMAL LOW (ref >40)
LDL Cholesterol: 75 mg/dL (ref 0–99)
Total CHOL/HDL Ratio: 4.4 RATIO
Triglycerides: 83 mg/dL (ref <150)
VLDL: 17 mg/dL (ref 0–40)

## 2019-10-01 LAB — CBC
HCT: 32.9 % — ABNORMAL LOW (ref 39.0–52.0)
Hemoglobin: 11.2 g/dL — ABNORMAL LOW (ref 13.0–17.0)
MCH: 30.3 pg (ref 26.0–34.0)
MCHC: 34 g/dL (ref 30.0–36.0)
MCV: 88.9 fL (ref 80.0–100.0)
Platelets: 215 10*3/uL (ref 150–400)
RBC: 3.7 MIL/uL — ABNORMAL LOW (ref 4.22–5.81)
RDW: 14.9 % (ref 11.5–15.5)
WBC: 12.6 10*3/uL — ABNORMAL HIGH (ref 4.0–10.5)
nRBC: 0 % (ref 0.0–0.2)

## 2019-10-01 LAB — GLUCOSE, CAPILLARY
Glucose-Capillary: 109 mg/dL — ABNORMAL HIGH (ref 70–99)
Glucose-Capillary: 110 mg/dL — ABNORMAL HIGH (ref 70–99)
Glucose-Capillary: 84 mg/dL (ref 70–99)
Glucose-Capillary: 93 mg/dL (ref 70–99)

## 2019-10-01 LAB — BASIC METABOLIC PANEL
Anion gap: 10 (ref 5–15)
BUN: 9 mg/dL (ref 6–20)
CO2: 25 mmol/L (ref 22–32)
Calcium: 8.2 mg/dL — ABNORMAL LOW (ref 8.9–10.3)
Chloride: 104 mmol/L (ref 98–111)
Creatinine, Ser: 0.9 mg/dL (ref 0.61–1.24)
GFR calc Af Amer: >60 mL/min (ref >60)
GFR calc non Af Amer: >60 mL/min (ref >60)
Glucose, Bld: 89 mg/dL (ref 70–99)
Potassium: 3.4 mmol/L — ABNORMAL LOW (ref 3.5–5.1)
Sodium: 139 mmol/L (ref 135–145)

## 2019-10-01 LAB — H. PYLORI ANTIBODY, IGG: H Pylori IgG: 0.5 Index Value (ref 0.00–0.79)

## 2019-10-01 MED ORDER — PANTOPRAZOLE SODIUM 40 MG PO TBEC: 40.0000 mg | DELAYED_RELEASE_TABLET | Freq: Two times a day (BID) | ORAL | 2 refills | Status: DC

## 2019-10-01 MED ORDER — ASPIRIN EC 81 MG PO TBEC: 81.0000 mg | DELAYED_RELEASE_TABLET | Freq: Every day | ORAL | 3 refills | Status: DC

## 2019-10-01 MED ORDER — PANTOPRAZOLE SODIUM 40 MG PO TBEC: 40.0000 mg | DELAYED_RELEASE_TABLET | Freq: Two times a day (BID) | ORAL | Status: DC

## 2019-10-01 MED ORDER — ROSUVASTATIN CALCIUM 20 MG PO TABS
40.0000 mg | ORAL_TABLET | Freq: Every day | ORAL | Status: DC
  Administered 2019-10-01: 40 mg via ORAL
  Filled 2019-10-01: qty 2

## 2019-10-01 MED FILL — Verapamil HCl IV Soln 2.5 MG/ML: INTRAVENOUS | Qty: 2 | Status: AC

## 2019-10-01 NOTE — Progress Notes (Signed)
Cardiology Progress Note  Patient ID: Raymond Carney MRN: [id number] DOB: 10-27-1961 Date of Encounter: 10/01/2019  Primary Cardiologist: Candee Furbish, MD  Subjective  Left heart cath with CTO of left circumflex and obstructive proximal LAD disease.  Nonobstructive disease in the RCA.  No symptoms of chest pain.  ROS:  All other ROS reviewed and negative. Pertinent positives noted in the HPI.     Inpatient Medications  Scheduled Meds: . Chlorhexidine Gluconate Cloth  6 each Topical Daily  . heparin  5,000 Units Subcutaneous Q8H  . insulin aspart  0-9 Units Subcutaneous Q4H  . mouth rinse  15 mL Mouth Rinse BID  . pantoprazole (PROTONIX) IV  40 mg Intravenous Q12H  . rosuvastatin  40 mg Oral Daily  . sodium chloride flush  3 mL Intravenous Q12H   Continuous Infusions: . sodium chloride    . dextrose 5 % and 0.9% NaCl 50 mL/hr at 09/30/19 0624   PRN Meds: sodium chloride, acetaminophen, fentaNYL (SUBLIMAZE) injection, ondansetron (ZOFRAN) IV, oxyCODONE, sodium chloride flush   Vital Signs   Vitals:   10/01/19 0043 10/01/19 0547 10/01/19 0744 10/01/19 0746  BP: 103/80 102/77 (!) 66/54 100/69  Pulse: 64 61 63 (!) 59  Resp: 20 20 20    Temp: 97.8 F (36.6 C) 97.8 F (36.6 C)  98.5 F (36.9 C)  TempSrc: Oral Oral  Oral  SpO2: 97% 98% 97%   Weight:  56.7 kg    Height:        Intake/Output Summary (Last 24 hours) at 10/01/2019 0900 Last data filed at 10/01/2019 0553 Gross per 24 hour  Intake 310 ml  Output 2700 ml  Net -2390 ml   Last 3 Weights 10/01/2019 09/30/2019 09/30/2019  Weight (lbs) 125 lb 131 lb 3.2 oz 129 lb 6.6 oz  Weight (kg) 56.7 kg 59.512 kg 58.7 kg      Telemetry  Overnight telemetry shows sinus bradycardia ~60 bpm, which I personally reviewed.   Physical Exam   Vitals:   10/01/19 0043 10/01/19 0547 10/01/19 0744 10/01/19 0746  BP: 103/80 102/77 (!) 66/54 100/69  Pulse: 64 61 63 (!) 59  Resp: 20 20 20    Temp: 97.8 F (36.6 C) 97.8 F (36.6 C)   98.5 F (36.9 C)  TempSrc: Oral Oral  Oral  SpO2: 97% 98% 97%   Weight:  56.7 kg    Height:         Intake/Output Summary (Last 24 hours) at 10/01/2019 0900 Last data filed at 10/01/2019 0553 Gross per 24 hour  Intake 310 ml  Output 2700 ml  Net -2390 ml    Last 3 Weights 10/01/2019 09/30/2019 09/30/2019  Weight (lbs) 125 lb 131 lb 3.2 oz 129 lb 6.6 oz  Weight (kg) 56.7 kg 59.512 kg 58.7 kg    Body mass index is 19.01 kg/m.  General: Well nourished, well developed, in no acute distress Head: Atraumatic, normal size  Eyes: PEERLA, EOMI  Neck: Supple, no JVD Endocrine: No thryomegaly Cardiac: Normal S1, S2; RRR; no murmurs, rubs, or gallops Lungs: Clear to auscultation bilaterally, no wheezing, rhonchi or rales  Abd: Soft, nontender, no hepatomegaly  Ext: Right femoral cath site clean dry without evidence of hematoma.  There is a bruit present but this is related to his PAD.  The right radial cath site is clean dry without evidence of hematoma. Musculoskeletal: No deformities, BUE and BLE strength normal and equal Skin: Warm and dry, no rashes   Neuro: Alert and oriented  to person, place, time, and situation, CNII-XII grossly intact, no focal deficits  Psych: Normal mood and affect   Labs  High Sensitivity Troponin:   Recent Labs  Lab 09/27/19 2126 09/27/19 2349 09/28/19 0324 09/28/19 0639 09/28/19 0937  TROPONINIHS 3,542* 6,462* [id number]* 9,635* 8,693*     Cardiac EnzymesNo results for input(s): TROPONINI in the last 168 hours. No results for input(s): TROPIPOC in the last 168 hours.  Chemistry Recent Labs  Lab 09/27/19 1521  09/29/19 0524 09/30/19 0053 10/01/19 0414  NA 137   < > 133* 138 139  K 3.9   < > 3.4* 3.6 3.4*  CL 103   < > 103 105 104  CO2 15*   < > 26 23 25   GLUCOSE 128*   < > 91 87 89  BUN 45*   < > 12 9 9   CREATININE 1.15   < > 0.73 0.85 0.90  CALCIUM 8.7*   < > 8.1* 8.2* 8.2*  PROT 5.5*  --  4.7*  --   --   ALBUMIN 3.4*  --  2.6*  --   --   AST 19   --  43*  --   --   ALT 10  --  17  --   --   ALKPHOS 31*  --  36*  --   --   BILITOT 0.5  --  0.8  --   --   GFRNONAA >60   < > >60 >60 >60  GFRAA >60   < > >60 >60 >60  ANIONGAP 19*   < > 4* 10 10   < > = values in this interval not displayed.    Hematology Recent Labs  Lab 09/29/19 0524 09/29/19 1200 09/30/19 0053 10/01/19 0414  WBC 15.3*  --  10.9* 12.6*  RBC 3.71*  --  3.95* 3.70*  HGB 11.4* 11.4* 12.1* 11.2*  HCT 32.3* 33.0* 34.6* 32.9*  MCV 87.1  --  87.6 88.9  MCH 30.7  --  30.6 30.3  MCHC 35.3  --  35.0 34.0  RDW 14.4  --  14.6 14.9  PLT 172  --  199 215   BNPNo results for input(s): BNP, PROBNP in the last 168 hours.  DDimer No results for input(s): DDIMER in the last 168 hours.   Radiology  No results found.  Cardiac Studies  TTE 09/28/2019  1. Left ventricular ejection fraction, by visual estimation, is 50 to 55%. The left ventricle has normal function. There is moderately increased left ventricular hypertrophy.  2. Left ventricular diastolic parameters are consistent with Grade II diastolic dysfunction (pseudonormalization).  3. Global right ventricle has normal systolic function.The right ventricular size is normal.  4. Left atrial size was normal.  5. Right atrial size was normal.  6. The mitral valve is normal in structure. Mild mitral valve regurgitation. No evidence of mitral stenosis.  7. The tricuspid valve is normal in structure. Tricuspid valve regurgitation is mild.  8. The aortic valve is tricuspid. Aortic valve regurgitation is not visualized. Mild aortic valve sclerosis without stenosis.  9. The pulmonic valve was normal in structure. Pulmonic valve regurgitation is not visualized. 10. Mildly elevated pulmonary artery systolic pressure. 11. The inferior vena cava is normal in size with greater than 50% respiratory variability, suggesting right atrial pressure of 3 mmHg. 12. Hypokinesis of the inferolateral wall with overall preserved LV function;  moderate LVH; grade 2 diastolic dysfunction; mild MR and TR; mildly elevated pulmonary pressure.  LHC 09/30/2019  Severe diffuse three-vessel coronary calcification.   Widely patent left main  Segmental 85% proximal to mid LAD.  Mid to distal eccentric 85% LAD.  100% ostial to proximal circumflex.  Fills via left to left collaterals.  Severe diffuse proximal to distal right coronary disease with eccentric 75% mid vessel stenosis.  Normal LV size and function.  EF 60%.  LVEDP is normal.  Patient Profile  Raymond Carney is a 58 y.o. 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  Assessment & Plan  1. Type 2 MI - Admitted with profound anemia and had diffuse ST depression and ST elevation in AVR concerning for global ischemia. Troponin peaked around 9000. No chest pain now or then. EGD with non-bleeding ulcers. TTE with low normal EF and WMA.  -Left heart cath yesterday with CTO of the circumflex, 85% proximal to mid LAD lesion, nonobstructive disease in the RCA -Given that he has no symptoms of chest pain and no significant left main disease, I think we can forego any plans for PCI at this time.  We also would not want to pursue any DAPT at this time given his significant gastric ulcers. -The best course of action is to resume aspirin 81 mg daily at the discretion of gastroenterology.  There is no rush to get on this clearly we do not want him to have any bleeding issues. -I have added back his home Crestor and he will need to be back on his home Zetia at discharge.  Repeat lipid profile shows LDL cholesterol 75 and we will aim for a goal less than 70.  There has been discussion about getting him on a PCSK9 inhibitor but he appears to not followed up on this. -Given his lack of angina or chest pain I would not pursue any beta-blockers or blood pressure agents at this time.  I think we can just see how he does and should he  develop symptoms we can evaluate him for PCI to the proximal to mid LAD. -We will arrange follow-up in our clinic in 4 to 6 weeks to continue to follow him clinically to determine if he actually developed angina.  We will appreciate final GI recommendations on when it will be safe to resume aspirin as well as when it would be possibly safe to pursue PCI with DAPT.  Again, we have no plans to pursue this at this time but it will be helpful to have this information moving forward.  For questions or updates, please contact [place] Please consult [email or web address] for contact info under   Signed, Lake Bells T. Audie Box, [place]  10/01/2019 9:00 AM

## 2019-10-01 NOTE — Discharge Summary (Signed)
Physician Discharge Summary  Raymond Carney [id number] DOB: 01/21/1961 DOA: 09/27/2019  PCP: Antony Contras, MD  Admit date: 09/27/2019 Discharge date: 10/01/2019  Time spent: 45 minutes  Recommendations for Outpatient Follow-up:  1. Stop DAPT and resume aspirin only about 10 days 2. Needs screening CBC and Chem-12 in about 1 week 3. Recommend follow-up of H. pylori in the outpatient setting 4. 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   Acute GI bleeding   Hemorrhagic shock (HCC)   Seizure versus vasovagal episode   Acute gastric ulcer with hemorrhage   Upper GI bleed status post EGD 1130   Discharge Condition: Fair  Diet recommendation: Heart healthy  Filed Weights   09/30/19 0437 09/30/19 1811 10/01/19 0547  Weight: 58.7 kg 59.5 kg 56.7 kg    History of present illness:  15 white male Pituitary surgery 1990 PVD with chronic femoral occlusion, 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 status post intervention 2016 admitted by CCM secondary to unresponsiveness after a test on 11/28-hemoglobin was found to be 6 down from 13 he had been taking Plavix as well as nonsteroidal medications-gastroenterology was consulted Cardiology was also consulted because of syncope and low normal EF-he underwent left heart cath in addition to  Hospital Course:  Syncope in the setting of hemorrhagic shock-patient had 4 units PRBCs EGD showed 2 clean-based large ulcers which was at high risk for rebleeding GI saw the patient recommended aspirin only after 10 days as per [facility] on discharge and Protonix twice daily which will be continued and he will need outpatient follow-up scope  Type II MI patient had an  echocardiogram as below showing low normal EF and he had a catheterization-it was felt that he may have had angina causing some of these issues and would warrant further outpatient management and follow-up potentially further testing-he is not a candidate once again for DAPT secondary to large amount of bleeding and has been recommended to resume aspirin in about 10 days  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-outpatient follow-up  Chronic left lower extremity ischemia-stable at this time outpatient  Procedures: Potassium now 3.4 LDL 79 WBC 12.6 hemoglobin 11.2 platelet 215 Echo 11/29 EF 50-55% with pseudonormalization-hypokinesis of inferolateral wall EGD-clean-based ulcers 1 of which was large Cardiac cath 12/1 severe three-vessel disease patent left main  Consultations:  Cardiologist  Gastroenterology  Discharge Exam: Vitals:   10/01/19 0746 10/01/19 1127  BP: 100/69 118/66  Pulse: (!) 59 (!) 59  Resp:  16  Temp: 98.5 F (36.9 C) 98.4 F (36.9 C)  SpO2:  100%    General: Awake alert coherent no distress asking when he can go home Cardiovascular: S1-S2 no murmur rub or gallop Respiratory: Clinically clear no added sound Abdomen soft no rebound no guarding Neurologically intact no focal deficit no lower extremity edema  Discharge Instructions   Discharge Instructions    Diet - low sodium heart healthy   Complete by: As directed    Discharge instructions   Complete by: As directed    The instructions carefully with regards to discharge medications and resumption of your aspirin-you will need close follow-up with gastroenterology because of the size of your ulcer and may need another scope  done so please call them after discharge and get follow-up Up in about 1 week in the outpatient you might need labs in about 1 month to determine if your hemoglobin is stable No Plavix no ibuprofen going forward for right now please  make sure you have a follow-up with cardiology in addition Report large amounts of dark stool or bright red blood per rectum.  Dr. And seek medical attention deficit.   Increase activity slowly   Complete by: As directed      Allergies as of 10/01/2019   No Known Allergies     Medication List    STOP taking these medications   clopidogrel 75 MG tablet Commonly known as: PLAVIX   ibuprofen 200 MG tablet Commonly known as: ADVIL     TAKE these medications   aspirin EC 81 MG tablet Take 1 tablet (81 mg total) by mouth daily. Start taking on: October 10, 2019 What changed: These instructions start on October 10, 2019. If you are unsure what to do until then, ask your doctor or other care provider.   ezetimibe 10 MG tablet Commonly known as: ZETIA Take 10 mg by mouth daily.   pantoprazole 40 MG tablet Commonly known as: PROTONIX Take 1 tablet (40 mg total) by mouth 2 (two) times daily.   rosuvastatin 20 MG tablet Commonly known as: CRESTOR Take 20 mg by mouth daily.      No Known Allergies Follow-up Information    Arta Silence, MD Follow up.   Specialty: Gastroenterology Why: [facility] will be calling you with appointments regarding follow-up of ulcers.  If you do not hear back within 10 days, please call their office as you need to be seen within next few weeks.   Contact information: [address] [place] [address] [phone]            The results of significant diagnostics from this hospitalization (including imaging, microbiology, ancillary and laboratory) are listed below for reference.    Significant Diagnostic Studies: Ct Angio Aortobifemoral W And/or Wo Contrast  Result Date: 09/27/2019 CLINICAL DATA:  Left leg pain and absent left femoral pulses. EXAM: CT ANGIOGRAPHY AOBIFEM WITH CONTRAST TECHNIQUE: Abdomen, pelvis, and bilateral lower extremity CTA was performed after bolus administration of iodinated contrast. Noncontrast  phase was not acquired. CONTRAST:  127mL OMNIPAQUE IOHEXOL 350 MG/ML SOLN COMPARISON:  None. FINDINGS: Aorta: Extensive atheromatous wall thickening with both calcified and noncalcified plaque. No dissection or aneurysm. Celiac: Moderate narrowing at the origin. Moderate narrowing the origin and just before the splenic common hepatic bifurcation. No acute finding. SMA: Motion artifact at the level of the proximal SMA. There is atherosclerosis without flow limiting stenosis or proximal occlusion. Renal: Severe right renal artery stenosis with underfilling compared to the left. Proximal left renal artery stenosis is also high-grade, not measurable due to small vessel size and degree of narrowing. IMA: Patent with stenosis and atherosclerosis the origin. Left iliacs and lower extremity: Occluded left common iliac artery at the origin with downstream underfilling suggesting this is chronic. The iliacs are heavily calcified. Faint reconstitution seen by the level of the common femoral artery where there is bulky calcified plaque. The superficial femoral artery on the left is occluded proximally with minimal thready flow seen at the midportion in the none seen at the level of the lower SFA. Branches of the profundus femoris do enhance. Muscular branches reconstitute the popliteal artery from the lateral geniculate branches and there is patent although diffusely  significantly attenuated lower popliteal artery and upper calf vessels. Faint posterior tibial artery is seen to the level of the ankle but not at the level of the foot. 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 There is diffuse narrowing of the external iliac artery with high-grade narrowing at the right common femoral artery. Severe and diffuse proximal right SFA stenosis. The profunda femoris is patent, as is the distal SFA although severely stenotic. Heavily diseased popliteal artery. The  tibioperoneal trunk is patent with patent posterior tibial and anterior tibial artery to the level of the ankle. Lower chest: Motion artifact. Circumferential atheromatous wall thickening of the aorta. Hepatobiliary: No focal liver abnormality.No evidence of biliary obstruction or stone. Pancreas: Unremarkable. Spleen: Small gland without acute abnormality. Adrenals/Urinary Tract: Negative adrenals. No hydronephrosis or stone. Relative, smooth right renal atrophy attributed to severe arterial stenosis. Unremarkable bladder. Stomach/Bowel: No obstruction. No evidence of bowel inflammation or ischemia. Vascular/Lymphatic: Vascular findings below no mass or adenopathy. Reproductive:No pathologic findings. Other: No ascites or pneumoperitoneum. 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 Subchondral collapse in the lateral femoral condyle on the left. Bilateral hip replacement. Diffuse osteopenia. Motion artifact. Review of the MIP images confirms the above findings. IMPRESSION: 1. On the symptomatic left side there is an occluded common iliac artery which appears chronic as there is collapse of the downstream vessels. Most of the left SFA is chronically occluded with well-formed profunda collaterals reconstituting the severely attenuated and heavily diseased popliteal artery. Severely attenuated left calf vessels, the posterior tibial artery makes it most distal into the calf but is no longer enhancing by the level of the foot. 2. Right lower extremity PVD is also severe with diffusely and significantly attenuated SFA and popliteal artery. There is single vessel runoff into the right foot. 3. High-grade bilateral proximal renal artery stenosis, worse on the right where there is downstream underfilling and right renal atrophy. 4. Motion degraded. Electronically Signed   By: Monte Fantasia M.D.   On: 09/27/2019 17:04    Microbiology: Recent Results (from the  past 240 hour(s))  SARS Coronavirus 2 by RT PCR (hospital order, performed in [facility] hospital lab) Nasopharyngeal Nasopharyngeal Swab     Status: None   Collection Time: 09/27/19  3:16 PM   Specimen: Nasopharyngeal Swab  Result Value Ref Range Status   SARS Coronavirus 2 NEGATIVE NEGATIVE Final    Comment: (NOTE) SARS-CoV-2 target nucleic acids are NOT DETECTED. The SARS-CoV-2 RNA is generally detectable in upper and lower respiratory specimens during the acute phase of infection. The lowest concentration of SARS-CoV-2 viral copies this assay can detect is 250 copies / mL. A negative result does not preclude SARS-CoV-2 infection and should not be used as the sole basis for treatment or other patient management decisions.  A negative result may occur with improper specimen collection / handling, submission of specimen other than nasopharyngeal swab, presence of viral mutation(s) within the areas targeted by this assay, and inadequate number of viral copies (<250 copies / mL). A negative result must be combined with clinical observations, patient history, and epidemiological information. Fact Sheet for Patients:   StrictlyIdeas.no Fact Sheet for Healthcare Providers: BankingDealers.co.za This test is not yet approved or cleared  by the [place] FDA and has been authorized for detection and/or diagnosis of SARS-CoV-2 by FDA under an Emergency Use Authorization (EUA).  This EUA will remain in effect (meaning this  test can be used) for the duration of the COVID-19 declaration under Section 564(b)(1) of the Act, 21 U.S.C. section 360bbb-3(b)(1), unless the authorization is terminated or revoked sooner. Performed at [facility], [place] [address] [place], [place] [address]   MRSA PCR Screening     Status: None   Collection Time: 09/27/19  8:29 PM   Specimen: Nasal Mucosa; Nasopharyngeal  Result Value Ref Range Status   MRSA  by PCR NEGATIVE NEGATIVE Final    Comment:        The GeneXpert MRSA Assay (FDA approved for NASAL specimens only), is one component of a comprehensive MRSA colonization surveillance program. It is not intended to diagnose MRSA infection nor to guide or monitor treatment for MRSA infections. Performed at [facility], [place] [address], [place], [place] [address]      Labs: Basic Metabolic Panel: Recent Labs  Lab 09/27/19 1521 09/27/19 1526 09/28/19 0324 09/29/19 0524 09/30/19 0053 10/01/19 0414  NA 137 135 137 133* 138 139  K 3.9 4.1 3.3* 3.4* 3.6 3.4*  CL 103 103 110 103 105 104  CO2 15*  --  19* 26 23 25   GLUCOSE 128* 123* 121* 91 87 89  BUN 45* 47* 26* 12 9 9   CREATININE 1.15 1.00 0.78 0.73 0.85 0.90  CALCIUM 8.7*  --  7.7* 8.1* 8.2* 8.2*  MG  --   --  1.9 2.0  --   --   PHOS  --   --  2.3*  --   --   --    Liver Function Tests: Recent Labs  Lab 09/27/19 1521 09/29/19 0524  AST 19 43*  ALT 10 17  ALKPHOS 31* 36*  BILITOT 0.5 0.8  PROT 5.5* 4.7*  ALBUMIN 3.4* 2.6*   No results for input(s): LIPASE, AMYLASE in the last 168 hours. No results for input(s): AMMONIA in the last 168 hours. CBC: Recent Labs  Lab 09/27/19 1521  09/27/19 2126 09/28/19 0324  09/28/19 2347 09/29/19 0524 09/29/19 1200 09/30/19 0053 10/01/19 0414  WBC 19.3*  --  24.9* 23.4*  --   --  15.3*  --  10.9* 12.6*  NEUTROABS 15.4*  --   --   --   --   --   --   --   --   --   HGB 6.7*   < > 12.6* 11.7*   < > 11.0* 11.4* 11.4* 12.1* 11.2*  HCT 21.2*   < > 35.4* 33.5*   < > 30.6* 32.3* 33.0* 34.6* 32.9*  MCV 92.6  --  85.7 86.6  --   --  87.1  --  87.6 88.9  PLT 259  --  167 172  --   --  172  --  199 215   < > = values in this interval not displayed.   Cardiac Enzymes: No results for input(s): CKTOTAL, CKMB, CKMBINDEX, TROPONINI in the last 168 hours. BNP: BNP (last 3 results) No results for input(s): BNP in the last 8760 hours.  ProBNP (last 3 results) No results for  input(s): PROBNP in the last 8760 hours.  CBG: Recent Labs  Lab 09/30/19 1841 09/30/19 2043 10/01/19 0041 10/01/19 0435 10/01/19 1125  GLUCAP 229* 145* 93 84 109*       Signed:  Nita Sells MD   [facility] 10/01/2019, 1:49 PM

## 2019-10-01 NOTE — Progress Notes (Signed)
Patient was discharged home by MD order; discharged instructions review and give to patient with care notes; IV DIC; skin is intact; patient will be escorted to the car by nurse tech via wheelchair.

## 2019-10-01 NOTE — Progress Notes (Addendum)
Daily Rounding Note  10/01/2019, 11:47 AM  LOS: 4 days   SUBJECTIVE:   Chief complaint: Gastric ulcers, GI bleed.  Blood loss anemia. No stools for about 3 days.  Feels well.  Tolerating solid food without nausea.  OBJECTIVE:         Vital signs in last 24 hours:    Temp:  [97.6 F (36.4 C)-99 F (37.2 C)] 98.4 F (36.9 C) (12/02 1127) Pulse Rate:  [35-79] 59 (12/02 1127) Resp:  [12-27] 16 (12/02 1127) BP: (66-131)/(52-84) 118/66 (12/02 1127) SpO2:  [82 %-100 %] 100 % (12/02 1127) Weight:  [56.7 kg-59.5 kg] 56.7 kg (12/02 0547) Last BM Date: 09/27/19 Filed Weights   09/30/19 0437 09/30/19 1811 10/01/19 0547  Weight: 58.7 kg 59.5 kg 56.7 kg   General: Pleasant, looks well.  NAD, comfortable. Heart: RRR Chest: Clear bilaterally.  No labored breathing, no cough. Abdomen: Soft.  Not tender or distended. Extremities: No CCE. Neuro/Psych: Oriented x3.  Fully alert.  No deficits.  Calm.  Intake/Output from previous day: 12/01 0701 - 12/02 0700 In: 310 [P.O.:310] Out: 2700 [Urine:2700]  Intake/Output this shift: Total I/O In: 220 [P.O.:220] Out: 700 [Urine:700]  Lab Results: Recent Labs    09/29/19 0524 09/29/19 1200 09/30/19 0053 10/01/19 0414  WBC 15.3*  --  10.9* 12.6*  HGB 11.4* 11.4* 12.1* 11.2*  HCT 32.3* 33.0* 34.6* 32.9*  PLT 172  --  199 215   BMET Recent Labs    09/29/19 0524 09/30/19 0053 10/01/19 0414  NA 133* 138 139  K 3.4* 3.6 3.4*  CL 103 105 104  CO2 26 23 25   GLUCOSE 91 87 89  BUN 12 9 9   CREATININE 0.73 0.85 0.90  CALCIUM 8.1* 8.2* 8.2*   LFT Recent Labs    09/29/19 0524  PROT 4.7*  ALBUMIN 2.6*  AST 43*  ALT 17  ALKPHOS 36*  BILITOT 0.8   Scheduled Meds:  Chlorhexidine Gluconate Cloth  6 each Topical Daily   heparin  5,000 Units Subcutaneous Q8H   insulin aspart  0-9 Units Subcutaneous Q4H   mouth rinse  15 mL Mouth Rinse BID   pantoprazole (PROTONIX) IV   40 mg Intravenous Q12H   rosuvastatin  40 mg Oral Daily   sodium chloride flush  3 mL Intravenous Q12H   Continuous Infusions:  sodium chloride     dextrose 5 % and 0.9% NaCl 50 mL/hr at 09/30/19 0624   PRN Meds:.sodium chloride, acetaminophen, fentaNYL (SUBLIMAZE) injection, ondansetron (ZOFRAN) IV, oxyCODONE, sodium chloride flush   ASSESMENT:   *   Upper GI bleed with melena and patient on Plavix, aspirin, Ibuprofen (600 mg q 6 hours). 09/29/2019 EGD showing HH, 2 clean-based gastric ulcers 1 of which was large and likely culprit for bleed.  Size of ulcer makes for higher risk of re-bleeding in next several days.   Protonix 40 iv bid in place. Serum H Pylori IgG still pending  *   Chronic Plavix, low-dose aspirin for peripheral vascular disease, extra-cranial  cerebro-vascular dz.  On hold.  Last doses 09/27/19.   *  ABL anemia.   Hgb 6.1 >> 1 U PRBC >> 12.1 >> 11.2.    *  Type 2 MI.    09/30/2019 left heart catheterization.  85% proximal LAD lesion, 85% mid LAD lesion.  Totally occluded circumflex with late filling via left to left collaterals.  Distal circumflex filling by collaterals.  RCA dominant, heavy  calcification in mid RCA with 50 to 60% stenosis.  Normal LVEDP.  Normal LV systolic function. Per cardiology note today, would like to resume 81 mg aspirin though no rash to restart.  No plans for PCI.  No plans for DAPT given significant gastric ulcers and bleeding  *   Seizures.      PLAN   *   Switch to Protonix 40 mg po BID until at least time of GI fup. Ok to discharge home.  I called [facility] to arrange fup appt  And they will call pt with details for ROV.  Pt had colonoscopy w [facility] a few years back (Dr Lenard Simmer) and Dr Paulita Fujita agreed to fup pt after discharge (discussed w Dr Havery Moros).    Will need re-look EGD in ~ 6 to 8 weeks to assure ulcers healing.    *   Hold ASA for another 10 days.      Raymond Carney  10/01/2019, 11:47 AM Phone [phone]

## 2019-10-29 ENCOUNTER — Telehealth: Payer: Self-pay | Admitting: Cardiology

## 2019-10-29 NOTE — Telephone Encounter (Signed)
Patient's sister states she needs to be present for his appointment.

## 2019-10-29 NOTE — Telephone Encounter (Signed)
Called patient's sister (DPR) back about message. Patient has some physical limitation and has a prior stroke. Patient's sister stated patient does not always remember everything. Informed her that she would need to wear a mask and would be screen before appointment. Patient's sister verbalized understanding and has no problem wearing mask and being screened. Made note in appointment notes.

## 2019-11-03 ENCOUNTER — Other Ambulatory Visit: Payer: Self-pay

## 2019-11-03 ENCOUNTER — Encounter: Payer: Self-pay | Admitting: Cardiology

## 2019-11-03 ENCOUNTER — Ambulatory Visit: Payer: Medicare Other | Admitting: Cardiology

## 2019-11-03 VITALS — BP 120/70 | HR 84 | Ht 68.0 in | Wt 136.0 lb

## 2019-11-03 DIAGNOSIS — Z8673 Personal history of transient ischemic attack (TIA), and cerebral infarction without residual deficits: Secondary | ICD-10-CM | POA: Diagnosis not present

## 2019-11-03 DIAGNOSIS — I2510 Atherosclerotic heart disease of native coronary artery without angina pectoris: Secondary | ICD-10-CM | POA: Diagnosis not present

## 2019-11-03 DIAGNOSIS — I739 Peripheral vascular disease, unspecified: Secondary | ICD-10-CM

## 2019-11-03 NOTE — Telephone Encounter (Signed)
noted 

## 2019-11-03 NOTE — Progress Notes (Signed)
Cardiology Office Note:    Date:  11/03/2019   ID:  Raymond Carney, DOB Dec 01, 1960, MRN [id number]  PCP:  Antony Contras, MD  Cardiologist:  Candee Furbish, MD  Electrophysiologist:  None   Referring MD: Antony Contras, MD     History of Present Illness:    Raymond Carney is a 59 y.o. male with hospitalization December 2020 with left heart cath showing CTO of left circumflex and obstructive proximal LAD disease nonobstructive disease in the RCA.  No symptoms of chest pain.  This heart cath was done in the setting of myocardial infarction.  Echocardiogram 09/28/2019 showed EF 50 to 55%  He was also seen by Dr. Donnetta Hutching of vascular surgery given his severe peripheral vascular disease.  Left leg discomfort in the setting of severe anemia.  Overall he still is not having any chest discomfort.  Continues to smoke.  His daughter was on the telephone to help complete evaluation and provide pertinent history and insight.  He has been having left leg pain that he states is sometimes improved when he walks short distances.  There is no evidence of lower extremity infection currently.  Past Medical History:  Diagnosis Date  . Carotid stenosis    [place] 0000000:  R [id number]; LICA occluded; prox L subcl occluded; R subcl > 50%  . COPD (chronic obstructive pulmonary disease) ([place])   . History of stroke    Echo 7/16:  EF 60-65  . Hyperlipidemia   . Pituitary tumor    s/p excision ~ 2000  . Stenosis of right subclavian artery ([place])    S/P STENT FOLLOWED BY DR. Leonie Man AND DR. [name]   . TIA (transient ischemic attack)     Past Surgical History:  Procedure Laterality Date  . bilateral hip replacements    . ESOPHAGOGASTRODUODENOSCOPY (EGD) WITH PROPOFOL N/A 09/29/2019   Procedure: ESOPHAGOGASTRODUODENOSCOPY (EGD) WITH PROPOFOL;  Surgeon: Yetta Flock, MD;  Location: [facility];  Service: Gastroenterology;  Laterality: N/A;  . HIP ARTHROPLASTY Bilateral    ON DISABILITY SECONDARY   . IR ANGIO INTRA  EXTRACRAN SEL COM CAROTID INNOMINATE UNI R MOD SED  07/03/2019  . IR ANGIOGRAM EXTREMITY RIGHT  07/03/2019  . LEFT HEART CATH AND CORONARY ANGIOGRAPHY N/A 09/30/2019   Procedure: LEFT HEART CATH AND CORONARY ANGIOGRAPHY;  Surgeon: Belva Crome, MD;  Location: [facility];  Service: Cardiovascular;  Laterality: N/A;  . OTHER SURGICAL HISTORY  1996   HISTORY OF PITUITARY TUMOR REMOVAL CIRCA  . RADIOLOGY WITH ANESTHESIA N/A 05/26/2015   Procedure: RADIOLOGY WITH ANESTHESIA/ANGIOPLASTY;  Surgeon: Luanne Bras, MD;  Location: [facility];  Service: Radiology;  Laterality: N/A;  . TRANSPHENOIDAL / TRANSNASAL HYPOPHYSECTOMY / RESECTION PITUITARY TUMOR      Current Medications: Current Meds  Medication Sig  . aspirin EC 81 MG tablet Take 1 tablet (81 mg total) by mouth daily.  [name] ezetimibe (ZETIA) 10 MG tablet Take 10 mg by mouth daily.   . pantoprazole (PROTONIX) 40 MG tablet Take 1 tablet (40 mg total) by mouth 2 (two) times daily.  . rosuvastatin (CRESTOR) 20 MG tablet Take 20 mg by mouth daily.     Allergies:   Patient has no known allergies.   Social History   Socioeconomic History  . Marital status: Divorced    Spouse name: Not on file  . Number of children: 1  . Years of education: Not on file  . Highest education level: Not on file  Occupational History  .  Occupation: Disabled  Tobacco Use  . Smoking status: Former Smoker    Packs/day: 0.50    Quit date: 01/22/2014    Years since quitting: 5.7  . Smokeless tobacco: Never Used  Substance and Sexual Activity  . Alcohol use: No  . Drug use: Never  . Sexual activity: Not on file  Other Topics Concern  . Not on file  Social History Narrative  . Not on file   Social Determinants of Health   Financial Resource Strain:   . Difficulty of Paying Living Expenses: Not on file  Food Insecurity:   . Worried About Charity fundraiser in the Last Year: Not on file  . Ran Out of Food in the Last Year: Not on file  Transportation  Needs:   . Lack of Transportation (Medical): Not on file  . Lack of Transportation (Non-Medical): Not on file  Physical Activity:   . Days of Exercise per Week: Not on file  . Minutes of Exercise per Session: Not on file  Stress:   . Feeling of Stress : Not on file  Social Connections:   . Frequency of Communication with Friends and Family: Not on file  . Frequency of Social Gatherings with Friends and Family: Not on file  . Attends Religious Services: Not on file  . Active Member of Clubs or Organizations: Not on file  . Attends Archivist Meetings: Not on file  . Marital Status: Not on file     Family History: The patient's family history includes Atrial fibrillation in his mother; CAD in his father and mother; CVA in his father; Colon cancer (age of onset: 48) in his sister; Congestive Heart Failure in his mother; Hypertension in his father and mother; Prostate cancer in his father; Stroke in his father. There is no history of Colon polyps or Liver cancer.  ROS:   Please see the history of present illness.     All other systems reviewed and are negative.  EKGs/Labs/Other Studies Reviewed:    The following studies were reviewed today:  TTE 09/28/2019 1. Left ventricular ejection fraction, by visual estimation, is 50 to 55%. The left ventricle has normal function. There is moderately increased left ventricular hypertrophy. 2. Left ventricular diastolic parameters are consistent with Grade II diastolic dysfunction (pseudonormalization). 3. Global right ventricle has normal systolic function.The right ventricular size is normal. 4. Left atrial size was normal. 5. Right atrial size was normal. 6. The mitral valve is normal in structure. Mild mitral valve regurgitation. No evidence of mitral stenosis. 7. The tricuspid valve is normal in structure. Tricuspid valve regurgitation is mild. 8. The aortic valve is tricuspid. Aortic valve regurgitation is not visualized.  Mild aortic valve sclerosis without stenosis. 9. The pulmonic valve was normal in structure. Pulmonic valve regurgitation is not visualized. 10. Mildly elevated pulmonary artery systolic pressure. 11. The inferior vena cava is normal in size with greater than 50% respiratory variability, suggesting right atrial pressure of 3 mmHg. 12. Hypokinesis of the inferolateral wall with overall preserved LV function; moderate LVH; grade 2 diastolic dysfunction; mild MR and TR; mildly elevated pulmonary pressure.  LHC 09/30/2019  Severe diffuse three-vessel coronary calcification.   Widely patent left main  Segmental 85% proximal to mid LAD. Mid to distal eccentric 85% LAD.  100% ostial to proximal circumflex. Fills via left to left collaterals.  Severe diffuse proximal to distal right coronary disease with eccentric 75% mid vessel stenosis.  Normal LV size and function. EF 60%.  LVEDP is normal.  EKG: Sinus rhythm with diffuse ST depressions  Recent Labs: 09/29/2019: ALT 17; Magnesium 2.0 10/01/2019: BUN 9; Creatinine, Ser 0.90; Hemoglobin 11.2; Platelets 215; Potassium 3.4; Sodium 139  Recent Lipid Panel    Component Value Date/Time   CHOL 119 10/01/2019 0414   TRIG 83 10/01/2019 0414   HDL 27 (L) 10/01/2019 0414   CHOLHDL 4.4 10/01/2019 0414   VLDL 17 10/01/2019 0414   LDLCALC 75 10/01/2019 0414    Physical Exam:    VS:  BP 120/70   Pulse 84   Ht 5\' 8"  (1.727 m)   Wt 136 lb (61.7 kg)   BMI 20.68 kg/m     Wt Readings from Last 3 Encounters:  11/03/19 136 lb (61.7 kg)  10/01/19 125 lb (56.7 kg)  07/03/19 135 lb (61.2 kg)     GEN: Thin, well developed in no acute distress HEENT: Normal NECK: No JVD; Bilateral carotid bruits LYMPHATICS: No lymphadenopathy CARDIAC: RRR, no murmurs, rubs, gallops RESPIRATORY:  Clear to auscultation without rales, wheezing or rhonchi  ABDOMEN: Soft, non-tender, non-distended MUSCULOSKELETAL:  No edema; No deformity  SKIN: Warm and  dry NEUROLOGIC:  Alert and oriented x 3 PSYCHIATRIC:  Normal affect   ASSESSMENT:    1. PAD (peripheral artery disease) ([place])   2. Coronary artery disease involving native coronary artery of native heart without angina pectoris   3. History of stroke    PLAN:    In order of problems listed above:  Type II myocardial infarction/severe coronary artery disease -Diffuse ST segment depressions noted concerning for global ischemia but EGD showed nonbleeding ulcers.  Left heart catheterization showed CTO of the circumflex 85% proximal to mid LAD and nonobstructive disease in the RCA.  With no symptoms of chest pain and no significant left main disease no PCI was performed.  No dual antiplatelet therapy because of significant gastric ulcers.  Aspirin 81 mg at the discretion of gastroenterology.  Crestor was added home Zetia.  LDL 75 recently.  No beta-blocker, no anginal symptoms and prior bradycardia.  Severe peripheral vascular disease -Seen by Dr. Sherren Mocha Early 09/28/2019.  Chronic leg left leg ischemia made worse by prior hypotension in the setting of severe anemia.  He was seen prior to that in July 2019, office note reviewed.  Since he is having some left leg symptoms, I recommended to his daughter who helped with important elements of the interview today that he get back into see Dr. Donnetta Hutching for any other possible intervention. 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  He does have extensive common femoral disease as well.  His superior mesenteric and celiac arteries are widely patent For now, certainly encourage smoking cessation.  Tobacco cessation discussed.  GI bleed/severe anemia -Hemoglobin resolved 13.1 outside labs checked from 10/21/2019.  Also ALT was 17, creatinine 0.85 prior LDL in July was 125.   Medication Adjustments/Labs and Tests Ordered: Current medicines are reviewed at length with the patient today.  Concerns regarding  medicines are outlined above.  No orders of the defined types were placed in this encounter.  No orders of the defined types were placed in this encounter.   Patient Instructions  Medication Instructions:  The current medical regimen is effective;  continue present plan and medications.  *If you need a refill on your cardiac medications before your next appointment, please call your pharmacy*  Follow-Up: At [facility], you and your health needs are our priority.  As part of our continuing mission to provide you with exceptional heart care, we have created designated Provider Care Teams.  These Care Teams include your primary Cardiologist (physician) and Advanced Practice Providers (APPs -  Physician Assistants and Nurse Practitioners) who all work together to provide you with the care you need, when you need it.  Your next appointment:  6 months  The format for your next appointment:   In Person  Provider:   Richardson Dopp, PA. And 1 year with Dr Marlou Porch  Thank you for choosing [facility]!!        Signed, Candee Furbish, MD  11/03/2019 5:08 PM    [place]

## 2019-11-03 NOTE — Patient Instructions (Addendum)
Medication Instructions:  The current medical regimen is effective;  continue present plan and medications.  *If you need a refill on your cardiac medications before your next appointment, please call your pharmacy*  Follow-Up: At [facility], you and your health needs are our priority.  As part of our continuing mission to provide you with exceptional heart care, we have created designated Provider Care Teams.  These Care Teams include your primary Cardiologist (physician) and Advanced Practice Providers (APPs -  Physician Assistants and Nurse Practitioners) who all work together to provide you with the care you need, when you need it.  Your next appointment:  6 months  The format for your next appointment:   In Person  Provider:   Richardson Dopp, PA. And 1 year with Dr Marlou Porch  Thank you for choosing [facility]!!

## 2019-12-15 ENCOUNTER — Other Ambulatory Visit: Payer: Self-pay | Admitting: Gastroenterology

## 2019-12-22 ENCOUNTER — Other Ambulatory Visit: Payer: Self-pay

## 2019-12-22 ENCOUNTER — Encounter: Payer: Self-pay | Admitting: Neurology

## 2019-12-22 ENCOUNTER — Ambulatory Visit: Payer: Medicare Other | Admitting: Neurology

## 2019-12-22 VITALS — BP 127/62 | HR 87 | Temp 97.1°F | Ht 68.0 in | Wt 143.2 lb

## 2019-12-22 DIAGNOSIS — I771 Stricture of artery: Secondary | ICD-10-CM

## 2019-12-22 NOTE — Patient Instructions (Signed)
I had a long discussion with the patient regarding his subclavian stenosis and occlusion and reviewed results of recent diagnostic cerebral catheter angiogram which shows stable appearance is findings.  He seems to be doing clinically well without any symptoms of subclavian steal hence continue present medical management.  I have counseled him again to quit smoking completely and he says he will try.  He will stay on aspirin for stroke prevention and maintain aggressive control of blood pressure with goal below 130/90 and lipids with LDL cholesterol goal below 70 mg percent.  Check follow-up carotid ultrasound study.  He will return for follow-up in the future in 6 months with my nurse practitioner Janett Billow or call earlier if necessary.

## 2019-12-22 NOTE — Progress Notes (Signed)
[facility] [address] [place]. [place] [address] [phone]       OFFICE FOLLOW-UP NOTE  Raymond. Raymond Carney Date of Birth:  13-Jun-1961 Medical Record Number:  [id number]   HPI: Raymond Carney is seen for first office f/u visit after [name] admission for stroke in July 2016. Raymond Carney is an 59 y.o. 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 Presented on 05/23/15 with stuttering symptoms of speech difficulty and right sided weakness. Speech deficits described as dysarthria and an apparent expressive aphasia. Acute onset of symptoms around 1800, resolved around 1hr later and then returned around 1935, this time lasting around 15 minutes before mostly resolving again. (dysarthria remains). Reported taking ASA and Plavix at home. CT scan of the head on admission showed no acute abnormality but an old left occipital lobe infarct. Patient previously had had catheter range exam performed on 01/08/15 by Dr. [name] which has shown occluded left common carotid, left subclavian artery with high-grade 95% stenosis of the right subclavian at its origin and 95% stenosis of the right vertebral at its origin with retrograde and antegrade reconstitution of the left MCA via anterior communicating artery collaterals. TPA was considered but not given because patient was considered too mild to treat with NIH of only 1 for dysarthria. MRI scan of the brain showed acute ischemic nonhemorrhagic left anterior temporal cortical infarct. Cerebral catheter angiogram 05/24/15 by Dr. [name] showed worsening of the right subclavian artery stenosis and right vertebral ostial stenosis with chronic left carotid and subclavian occlusion. Patient underwent elective right subclavian angioplasty proximal to the right vertebral artery origin which less to modest improvement in the caliber of the right vertebral  artery which did not need additional angioplasty. Patient was started on aspirin and Plavix and his speech improved. His minimum weakness in the right grip and hip flexors which improved as well. He states his done well since discharge. He is tolerating aspirin and Plavix without bleeding or bruising. He is also on Lipitor and tolerating it without myalgias or arthralgias. He states his blood pressure is good. Today it is 100/70. He quit smoking in spring of 2016. He has not yet seen Dr. the mesh were for follow-up. Patient wants to have a colonoscopy done because he was having some blood in his stools even prior to his stroke. Update 01/25/16 : He returns for follow-up after last visit 6 months ago. Continue to do well without recurrent stroke or TIA symptoms. He is still living with his dad but his sister is accompanying him today. Patient has not had done colonoscopy as he wanted to wait due to stroke risk. He had follow-up carotid ultrasound done on 11/22/15 which I have reviewed shows chronically occluded left common carotid, left vertebral and subclavian arteries. Right internal carotid artery shows elevated velocities in the 60-79% range and right subclavian greater than 50% stenosis. Patient remains on aspirin and Plavix tolerating it well with only minor bruising and no bleeding episodes. Patient does have a family history of colon cancer and his colonoscopy is overdue and he has questions about stroke risk while holding antiplatelet therapy for the procedure. [name] Update 08/09/2016 ; he returns for follow-up after last visit 6 months ago. He continues to do well without recurrent stroke or TIA symptoms. He remains on aspirin and Plavix which is tolerating well without bruising or bleeding. He had a follow-up carotid  ultrasound done in the vascular surgery lab on 05/22/16 which showed persistent chronic left ICA occlusion with 50-69% right ICA and greater than 50% right subclavian stenosis. Patient denies any  symptoms of subclavian steal lower TIA or stroke. States her blood pressure is well controlled. He is tolerating his cholesterol medication without muscle aches or pains. Her is not sure when he had his last lipid profile checked. Update 06/09/2019 : He returns for follow-up today after his last visit nearly 3 years ago.  He is accompanied by his sister.  Patient was lost to follow-up after last visit in October 2017.  He was living with his parents and after his mother died his sister who is accompanying him today for this visit is not helping him with medical follow-up.  Patient has not had any recurrent stroke or TIA symptoms since his stroke in August 2016.  However he had once quit smoking but has restarted smoking and now smokes 2packs/week.  He is on Plavix and tolerating it well with only minor bruising.  He was supposed to be on aspirin also but is not taking it.  He had lipid profile checked in 11/04/2018 which showed total cholesterol 301 and LDL 241 mg percent.  He has since been started on Zetia and Crestor 20 mg which he seems to be tolerating well.  States his blood pressure is usually well controlled though today it is borderline at 142/67 in office.  He had follow-up lipid profile checked a few months ago after starting his medications and apparently it was satisfactory but I do not have those results.  He has no new neurological complaints.  He had follow-up carotid ultrasound done in Dr. Luther Parody office on 05/07/2018 which had shown 40 to 59% right ICA and some right subclavian stenosis as well.  He was advised to have follow-up studies but he has not had them for more than a year Update 12/22/2019 : He returns for follow-up after last visit a year ago.  He states is doing well has not had any recurrent stroke or TIA symptoms.  He in fact has had no new health problems in the last 1 year.  He however continues to smoke 1 pack/day cigarettes every 3 days he states that he is cut back from his prior  smoking though.  He did undergo diagnostic cerebral catheter angiogram on 07/03/2019 by Dr. [name] which showed stable appearance of his intra and extracranial findings with 80% right subclavian stenosis and chronic left subclavian occlusion with 50% right ICA stenosis.  Reconstitution of the left vertebral artery via transverse branches arising from the lingual artery, right thyrocervical trunk and retrograde opacification of the occipital artery on the left and subsequent retrograde flow in the left subclavian artery.  Dr. [name] recommended conservative follow-up for these findings.  Patient denies symptoms of vertebrobasilar steal in the form of dizziness lightheadedness or vertigo and has no problems lifting his arms above his shoulders placing objects on overhead shelves.  He states his blood pressure is well controlled today it is 127/62.  He remains on aspirin and Crestor which is tolerating well without any side effects. ROS:   14 system review of systems is positive for slight memory difficulties, bruising and all other systems negative   no  complaints today   PMH:  Past Medical History:  Diagnosis Date  . Carotid stenosis    [place] 0000000:  R [id number]; LICA occluded; prox L subcl occluded; R subcl > 50%  . COPD (chronic  obstructive pulmonary disease) ([place])   . History of stroke    Echo 7/16:  EF 60-65  . Hyperlipidemia   . Pituitary tumor    s/p excision ~ 2000  . Stenosis of right subclavian artery ([place])    S/P STENT FOLLOWED BY DR. Leonie Man AND DR. [name]   . TIA (transient ischemic attack)     Social History:  Social History   Socioeconomic History  . Marital status: Divorced    Spouse name: Not on file  . Number of children: 1  . Years of education: Not on file  . Highest education level: Not on file  Occupational History  . Occupation: Disabled  Tobacco Use  . Smoking status: Former Smoker    Packs/day: 0.50    Quit date: 01/22/2014    Years since quitting: 5.9  .  Smokeless tobacco: Never Used  Substance and Sexual Activity  . Alcohol use: No  . Drug use: Never  . Sexual activity: Not on file  Other Topics Concern  . Not on file  Social History Narrative  . Not on file   Social Determinants of Health   Financial Resource Strain:   . Difficulty of Paying Living Expenses: Not on file  Food Insecurity:   . Worried About Charity fundraiser in the Last Year: Not on file  . Ran Out of Food in the Last Year: Not on file  Transportation Needs:   . Lack of Transportation (Medical): Not on file  . Lack of Transportation (Non-Medical): Not on file  Physical Activity:   . Days of Exercise per Week: Not on file  . Minutes of Exercise per Session: Not on file  Stress:   . Feeling of Stress : Not on file  Social Connections:   . Frequency of Communication with Friends and Family: Not on file  . Frequency of Social Gatherings with Friends and Family: Not on file  . Attends Religious Services: Not on file  . Active Member of Clubs or Organizations: Not on file  . Attends Archivist Meetings: Not on file  . Marital Status: Not on file  Intimate Partner Violence:   . Fear of Current or Ex-Partner: Not on file  . Emotionally Abused: Not on file  . Physically Abused: Not on file  . Sexually Abused: Not on file    Medications:   Current Outpatient Medications on File Prior to Visit  Medication Sig Dispense Refill  . aspirin EC 81 MG tablet Take 1 tablet (81 mg total) by mouth daily. 90 tablet 3  . ezetimibe (ZETIA) 10 MG tablet Take 10 mg by mouth daily.     . Multiple Vitamin (MULTIVITAMIN ADULT PO) Take by mouth.    . pantoprazole (PROTONIX) 40 MG tablet Take 1 tablet (40 mg total) by mouth 2 (two) times daily. 60 tablet 2  . rosuvastatin (CRESTOR) 20 MG tablet Take 20 mg by mouth daily.     No current facility-administered medications on file prior to visit.    Allergies:  No Known Allergies  Physical Exam General: Frail  malnourished looking middle-aged Caucasian male seated, in no evident distress Head: head normocephalic and atraumatic.  Neck: supple with soft right carotid , subclavian and right vertebral and basilar bruits. Cardiovascular: regular rate and rhythm, no murmurs Musculoskeletal: no deformity Skin:  no rash/petichiae Vascular:  Normal pulses all extremities. Right ocular, subclavian and carotid bruit heard. No pulses felt in bilateral upper extremity. y. Vitals:   12/22/19 1031  BP: 127/62  Pulse: 87  Temp: (!) 97.1 F (36.2 C)   Neurologic Exam Mental Status: Awake and fully alert. Oriented to place and time. Recent and remote memory intact. Attention span, concentration and fund of knowledge appropriate. Mood and affect appropriate.  Cranial Nerves: Fundoscopic exam not done  . Pupils equal, briskly reactive to light. Extraocular movements full without nystagmus. Visual fields full to confrontation. Hearing intact. Facial sensation intact. Face, tongue, palate moves normally and symmetrically.  Motor: Normal bulk and tone. Normal strength in all tested extremity muscles. Sensory.: intact to touch ,pinprick .position and vibratory sensation.  Coordination: Rapid alternating movements normal in all extremities. Finger-to-nose and heel-to-shin performed accurately bilaterally. Gait and Station: Arises from chair without difficulty. Stance is normal. Gait demonstrates normal stride length and balance . Unable to heel, toe and tandem walk without difficulty.  Reflexes: 1+ and symmetric. Toes downgoing.      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 Patient underwent successful right subclavian angioplasty on 05/26/2015.  He has done well without recurrent neurovascular symptoms but continues  to smoke.    PLAN: I had a long discussion with the patient regarding his subclavian stenosis and occlusion and reviewed results of recent diagnostic cerebral catheter angiogram which shows stable appearance is findings.  He seems to be doing clinically well without any symptoms of subclavian steal hence continue present medical management.  I have counseled him again to quit smoking completely and he says he will try.  He will stay on aspirin for stroke prevention and maintain aggressive control of blood pressure with goal below 130/90 and lipids with LDL cholesterol goal below 70 mg percent.  Check follow-up carotid ultrasound study.  He will return for follow-up in the future in 6 months with my nurse practitioner Janett Billow or call earlier if necessary. Greater than 50% time during this 30-minute visit was spent in counseling and coordination of care about his subclavian stenosis and discussion about stroke prevention and treatment and answering questions.  Antony Contras, MD  Note: This document was prepared with digital dictation and possible smart phrase technology. Any transcriptional errors that result from this process are unintentional

## 2019-12-25 NOTE — Progress Notes (Signed)
I spoke with Raymond Carney sister, they are planning to cancel the Endoscopy procedure for 12/31/19 and reschedule for April.

## 2019-12-29 ENCOUNTER — Other Ambulatory Visit: Payer: Self-pay

## 2019-12-29 ENCOUNTER — Ambulatory Visit (HOSPITAL_COMMUNITY): Payer: Medicare Other

## 2019-12-29 ENCOUNTER — Telehealth: Payer: Self-pay | Admitting: *Deleted

## 2019-12-29 ENCOUNTER — Ambulatory Visit (HOSPITAL_COMMUNITY)
Admission: RE | Admit: 2019-12-29 | Discharge: 2019-12-29 | Disposition: A | Discharge status: Home / Self Care | Payer: Medicare Other | Source: Ambulatory Visit | Attending: Neurology | Admitting: Neurology

## 2019-12-29 DIAGNOSIS — I771 Stricture of artery: Secondary | ICD-10-CM | POA: Insufficient documentation

## 2019-12-29 NOTE — Progress Notes (Signed)
Carotid artery duplex completed. Refer to "CV Proc" under chart review to view preliminary results.  12/29/2019 10:44 AM Kelby Aline., MHA, RVT, RDCS, RDMS

## 2019-12-29 NOTE — Progress Notes (Signed)
Kindly inform the patient that follow-up carotid ultrasound study shows mostly stable and unchanged findings which do not significantly change from cerebral catheter angiogram from last year.  Since patient is clinically doing well no further action is necessary at this time

## 2019-12-29 NOTE — Telephone Encounter (Signed)
Called patient's Raymond Carney, on [place] and informed her that follow-up carotid ultrasound study shows mostly stable and unchanged findings which are not significantly change from cerebral catheter angiogram from last year. Since he is clinically doing well no further action is necessary at this time. Raymond Carney verbalized understanding, appreciation.

## 2019-12-30 NOTE — Progress Notes (Signed)
Talked with patients sister Santiago Glad and she stated that they did intend to reschedule this appointment scheduled at [facility] endoscopy at 0830. They would call dr. Erlinda Hong office to reschedule .

## 2019-12-31 ENCOUNTER — Ambulatory Visit (HOSPITAL_COMMUNITY): Admission: RE | Admit: 2019-12-31 | Payer: Medicare Other | Source: Home / Self Care | Admitting: Gastroenterology

## 2019-12-31 ENCOUNTER — Encounter (HOSPITAL_COMMUNITY): Admission: RE | Payer: Self-pay | Source: Home / Self Care

## 2019-12-31 SURGERY — ESOPHAGOGASTRODUODENOSCOPY (EGD) WITH PROPOFOL
Anesthesia: Monitor Anesthesia Care

## 2020-02-14 ENCOUNTER — Other Ambulatory Visit (HOSPITAL_COMMUNITY)
Admission: RE | Admit: 2020-02-14 | Discharge: 2020-02-14 | Disposition: A | Discharge status: Home / Self Care | Payer: Medicare Other | Source: Ambulatory Visit | Attending: Gastroenterology | Admitting: Gastroenterology

## 2020-02-14 DIAGNOSIS — Z20822 Contact with and (suspected) exposure to covid-19: Secondary | ICD-10-CM | POA: Insufficient documentation

## 2020-02-14 DIAGNOSIS — Z01812 Encounter for preprocedural laboratory examination: Secondary | ICD-10-CM | POA: Insufficient documentation

## 2020-02-14 LAB — SARS CORONAVIRUS 2 (TAT 6-24 HRS): SARS Coronavirus 2: NEGATIVE

## 2020-02-17 ENCOUNTER — Encounter (HOSPITAL_COMMUNITY): Payer: Medicare Other

## 2020-02-17 ENCOUNTER — Other Ambulatory Visit: Payer: Self-pay | Admitting: Gastroenterology

## 2020-02-18 ENCOUNTER — Ambulatory Visit (HOSPITAL_COMMUNITY): Payer: Medicare Other | Admitting: Anesthesiology

## 2020-02-18 ENCOUNTER — Encounter (HOSPITAL_COMMUNITY): Payer: Self-pay | Admitting: Gastroenterology

## 2020-02-18 ENCOUNTER — Ambulatory Visit (HOSPITAL_COMMUNITY)
Admission: RE | Admit: 2020-02-18 | Discharge: 2020-02-18 | Disposition: A | Discharge status: Home / Self Care | Payer: Medicare Other | Attending: Gastroenterology | Admitting: Gastroenterology

## 2020-02-18 ENCOUNTER — Encounter (HOSPITAL_COMMUNITY)
Admission: RE | Disposition: A | Discharge status: Home / Self Care | Payer: Self-pay | Source: Home / Self Care | Attending: Gastroenterology

## 2020-02-18 ENCOUNTER — Other Ambulatory Visit: Payer: Self-pay

## 2020-02-18 DIAGNOSIS — K259 Gastric ulcer, unspecified as acute or chronic, without hemorrhage or perforation: Secondary | ICD-10-CM | POA: Diagnosis not present

## 2020-02-18 DIAGNOSIS — E785 Hyperlipidemia, unspecified: Secondary | ICD-10-CM | POA: Insufficient documentation

## 2020-02-18 DIAGNOSIS — I739 Peripheral vascular disease, unspecified: Secondary | ICD-10-CM | POA: Insufficient documentation

## 2020-02-18 DIAGNOSIS — J449 Chronic obstructive pulmonary disease, unspecified: Secondary | ICD-10-CM | POA: Diagnosis not present

## 2020-02-18 DIAGNOSIS — Z955 Presence of coronary angioplasty implant and graft: Secondary | ICD-10-CM | POA: Diagnosis not present

## 2020-02-18 DIAGNOSIS — Z79899 Other long term (current) drug therapy: Secondary | ICD-10-CM | POA: Diagnosis not present

## 2020-02-18 DIAGNOSIS — Z8673 Personal history of transient ischemic attack (TIA), and cerebral infarction without residual deficits: Secondary | ICD-10-CM | POA: Insufficient documentation

## 2020-02-18 DIAGNOSIS — I252 Old myocardial infarction: Secondary | ICD-10-CM | POA: Diagnosis not present

## 2020-02-18 DIAGNOSIS — Z7982 Long term (current) use of aspirin: Secondary | ICD-10-CM | POA: Diagnosis not present

## 2020-02-18 DIAGNOSIS — K2970 Gastritis, unspecified, without bleeding: Secondary | ICD-10-CM | POA: Insufficient documentation

## 2020-02-18 DIAGNOSIS — Z96643 Presence of artificial hip joint, bilateral: Secondary | ICD-10-CM | POA: Diagnosis not present

## 2020-02-18 DIAGNOSIS — Z8711 Personal history of peptic ulcer disease: Secondary | ICD-10-CM | POA: Diagnosis present

## 2020-02-18 HISTORY — PX: ESOPHAGOGASTRODUODENOSCOPY (EGD) WITH PROPOFOL: SHX5813

## 2020-02-18 SURGERY — ESOPHAGOGASTRODUODENOSCOPY (EGD) WITH PROPOFOL
Anesthesia: Monitor Anesthesia Care

## 2020-02-18 MED ORDER — LIDOCAINE HCL 1 % IJ SOLN
INTRAMUSCULAR | Status: DC | PRN
  Administered 2020-02-18 (×2): 50 mg via INTRADERMAL

## 2020-02-18 MED ORDER — PROPOFOL 500 MG/50ML IV EMUL: INTRAVENOUS | Status: DC | PRN

## 2020-02-18 MED ORDER — PHENYLEPHRINE HCL (PRESSORS) 10 MG/ML IV SOLN
INTRAVENOUS | Status: DC | PRN
  Administered 2020-02-18: 100 ug via INTRAVENOUS
  Administered 2020-02-18: 80 ug via INTRAVENOUS
  Administered 2020-02-18 (×2): 40 ug via INTRAVENOUS

## 2020-02-18 MED ORDER — PROPOFOL 10 MG/ML IV BOLUS
INTRAVENOUS | Status: DC | PRN
  Administered 2020-02-18 (×2): 20 mg via INTRAVENOUS

## 2020-02-18 MED ORDER — EPHEDRINE SULFATE 50 MG/ML IJ SOLN
INTRAMUSCULAR | Status: DC | PRN
  Administered 2020-02-18: 5 mg via INTRAVENOUS
  Administered 2020-02-18 (×2): 10 mg via INTRAVENOUS
  Administered 2020-02-18: 15 mg via INTRAVENOUS
  Administered 2020-02-18 (×2): 5 mg via INTRAVENOUS

## 2020-02-18 MED ORDER — SODIUM CHLORIDE 0.9 % IV SOLN: INTRAVENOUS | Status: DC

## 2020-02-18 MED ORDER — PROPOFOL 500 MG/50ML IV EMUL
INTRAVENOUS | Status: DC | PRN
  Administered 2020-02-18: 125 ug/kg/min via INTRAVENOUS

## 2020-02-18 MED ORDER — LACTATED RINGERS IV SOLN: INTRAVENOUS | Status: DC | PRN

## 2020-02-18 SURGICAL SUPPLY — 15 items

## 2020-02-18 NOTE — Anesthesia Postprocedure Evaluation (Signed)
Anesthesia Post Note  Patient: Raymond Carney  Procedure(s) Performed: ESOPHAGOGASTRODUODENOSCOPY (EGD) WITH PROPOFOL (N/A )     Patient location during evaluation: Endoscopy Anesthesia Type: MAC Level of consciousness: awake and alert Pain management: pain level controlled Vital Signs Assessment: post-procedure vital signs reviewed and stable Respiratory status: spontaneous breathing, nonlabored ventilation, respiratory function stable and patient connected to nasal cannula oxygen Cardiovascular status: stable and blood pressure returned to baseline Postop Assessment: no apparent nausea or vomiting Anesthetic complications: no    Last Vitals:  Vitals:   02/18/20 0931 02/18/20 0934  BP: 95/63 103/72  Pulse: 66 62  Resp: 12 14  Temp:    SpO2: 92% 100%    Last Pain:  Vitals:   02/18/20 0934  TempSrc:   PainSc: 0-No pain                 Jeanean Hollett L Cadience Bradfield

## 2020-02-18 NOTE — Discharge Instructions (Signed)

## 2020-02-18 NOTE — Op Note (Signed)
[facility] Patient Name: Raymond Carney Procedure Date: 02/18/2020 MRN: [id number] Attending MD: Arta Silence , MD Date of Birth: 09-25-1961 CSN: [id number] Age: 59 Admit Type: Outpatient Procedure:                Upper GI endoscopy Indications:              Follow-up of peptic ulcer Providers:                Arta Silence, MD, Cleda Daub, RN, Meriem Lemieux Dalton, Technician Referring MD:              Medicines:                Monitored Anesthesia Care Complications:            No immediate complications. Estimated Blood Loss:     Estimated blood loss: none. Procedure:                Pre-Anesthesia Assessment:                           - Prior to the procedure, a History and Physical                            was performed, and patient medications and                            allergies were reviewed. The patient's tolerance of                            previous anesthesia was also reviewed. The risks                            and benefits of the procedure and the sedation                            options and risks were discussed with the patient.                            All questions were answered, and informed consent                            was obtained. Prior Anticoagulants: The patient has                            taken no previous anticoagulant or antiplatelet                            agents except for aspirin. ASA Grade Assessment:                            III - A patient with severe systemic disease. After  reviewing the risks and benefits, the patient was                            deemed in satisfactory condition to undergo the                            procedure.                           After obtaining informed consent, the endoscope was                            passed under direct vision. Throughout the                            procedure, the patient's blood pressure, pulse, and                             oxygen saturations were monitored continuously. The                            [id number]-[id number]) Olympus gastroscope was                            introduced through the mouth, and advanced to the                            second part of duodenum. The upper GI endoscopy was                            accomplished without difficulty. The patient                            tolerated the procedure well. Scope In: Scope Out: Findings:      The examined esophagus was normal.      Patchy mild inflammation and some scarring was found in the prepyloric       region of the stomach. All consistent with expected findings in midst of       ulcer healing (especially one as large as was seen initially). Ulcer       almost completely healed.      The exam of the stomach was otherwise normal.      The duodenal bulb, first portion of the duodenum and second portion of       the duodenum were normal. Impression:               - Normal esophagus.                           - Gastritis with changes compatible with ulcer                            healing. Near complete resolution of previously                            seen large antral ulcer.                           -  Normal duodenal bulb, first portion of the                            duodenum and second portion of the duodenum. Moderate Sedation:      None Recommendation:           - Patient has a contact number available for                            emergencies. The signs and symptoms of potential                            delayed complications were discussed with the                            patient. Return to normal activities tomorrow.                            Written discharge instructions were provided to the                            patient.                           - Discharge patient to home (via wheelchair).                           - Resume previous diet today.                           -  Continue present medications. Would recommend                            lifelong QD PPI therapy.                           - Return to GI clinic in 6 months.                           - Return to referring physician as previously                            scheduled. Procedure Code(s):        --- Professional ---                           [phone], Esophagogastroduodenoscopy, flexible,                            transoral; diagnostic, including collection of                            specimen(s) by brushing or washing, when performed                            (separate procedure) Diagnosis Code(s):        --- Professional ---  K29.70, Gastritis, unspecified, without bleeding                           K27.9, Peptic ulcer, site unspecified, unspecified                            as acute or chronic, without hemorrhage or                            perforation CPT copyright 2019 American Medical Association. All rights reserved. The codes documented in this report are preliminary and upon coder review may  be revised to meet current compliance requirements. Arta Silence, MD 02/18/2020 9:12:33 AM This report has been signed electronically. Number of Addenda: 0

## 2020-02-18 NOTE — Transfer of Care (Signed)
Immediate Anesthesia Transfer of Care Note  Patient: Raymond Carney  Procedure(s) Performed: ESOPHAGOGASTRODUODENOSCOPY (EGD) WITH PROPOFOL (N/A )  Patient Location: PACU and Endoscopy Unit  Anesthesia Type:MAC  Level of Consciousness: awake, alert , oriented and patient cooperative  Airway & Oxygen Therapy: Patient Spontanous Breathing and Patient connected to face mask oxygen  Post-op Assessment: Report given to RN and Post -op Vital signs reviewed and stable  Post vital signs: Reviewed and stable  Last Vitals:  Vitals Value Taken Time  BP 97/58 02/18/20 0920  Temp 36.5 C 02/18/20 0910  Pulse 70 02/18/20 0920  Resp 19 02/18/20 0920  SpO2 100 % 02/18/20 0920  Vitals shown include unvalidated device data.  Last Pain:  Vitals:   02/18/20 0910  TempSrc: Axillary  PainSc: 0-No pain         Complications: No apparent anesthesia complications

## 2020-02-18 NOTE — Anesthesia Preprocedure Evaluation (Addendum)
Anesthesia Evaluation  Patient identified by MRN, date of birth, ID band Patient awake    Reviewed: Allergy & Precautions, NPO status , Patient's Chart, lab work & pertinent test results  Airway Mallampati: II  TM Distance: >3 FB Neck ROM: Full    Dental  (+) Missing, Chipped, Poor Dentition   Pulmonary COPD, Patient abstained from smoking., former smoker,    Pulmonary exam normal breath sounds clear to auscultation       Cardiovascular + Past MI and + Peripheral Vascular Disease (right subclavian stenosis s/p stent 07/2019; carotid stenosis)  Normal cardiovascular exam Rhythm:Regular Rate:Normal  HLD  TTE 08/2019 EF 55%, G2DD, mild MR, mildly elevated pulmonary pressure  LHC 09/2019 Severe diffuse three-vessel coronary calcification.  Widely patent left main Segmental 85% proximal to mid LAD.  Mid to distal eccentric 85% LAD. 100% ostial to proximal circumflex.  Fills via left to left collaterals. Severe diffuse proximal to distal right coronary disease with eccentric 75% mid vessel stenosis. Normal LV size and function.  EF 60%.  LVEDP is normal. RECOMMENDATION: Severe underlying three-vessel coronary disease. The patient is not currently appropriate for revascularization therapy given recent active GI bleeding with melena requiring 4 units of blood. High risk for ischemic events,  however the patient needs time to heal the gastric ulcers which will lessen the risk of bleeding when coronary artery bypass grafting is considered. He would be high risk for complications with vascular surgery prior to coronary revascularization   Neuro/Psych Pituitary tumor s/p excision 2000 TIACVA negative psych ROS   GI/Hepatic Neg liver ROS, PUD, Gastric ulcer   Endo/Other  negative endocrine ROS  Renal/GU negative Renal ROS  negative genitourinary   Musculoskeletal negative musculoskeletal ROS (+)   Abdominal   Peds  Hematology negative hematology ROS (+)   Anesthesia Other Findings   Reproductive/Obstetrics                            Anesthesia Physical Anesthesia Plan  ASA: III  Anesthesia Plan: MAC   Post-op Pain Management:    Induction: Intravenous  PONV Risk Score and Plan: 1 and Propofol infusion and Treatment may vary due to age or medical condition  Airway Management Planned: Natural Airway  Additional Equipment:   Intra-op Plan:   Post-operative Plan:   Informed Consent: I have reviewed the patients History and Physical, chart, labs and discussed the procedure including the risks, benefits and alternatives for the proposed anesthesia with the patient or authorized representative who has indicated his/her understanding and acceptance.     Dental advisory given  Plan Discussed with: CRNA  Anesthesia Plan Comments:         Anesthesia Quick Evaluation

## 2020-02-18 NOTE — H&P (Signed)
Patient interval history reviewed.  Patient examined again.  There has been no change from documented H/P scanned into chart from our office except as documented below.  Assessment:  1.  Gastric ulcer; follow up to ensure healing.  HP serologies negative; felt to be NSAID-mediated.  Currently on PPI.  Plan:  1.  Endoscopy to follow up gastric ulcers, and ensure healing. 2.  Risks (bleeding, infection, bowel perforation that could require surgery, sedation-related changes in cardiopulmonary systems), benefits (identification and possible treatment of source of symptoms, exclusion of certain causes of symptoms), and alternatives (watchful waiting, radiographic imaging studies, empiric medical treatment) of upper endoscopy (EGD) were explained to patient/family in detail and patient wishes to proceed.

## 2020-02-20 ENCOUNTER — Encounter: Payer: Self-pay | Admitting: *Deleted

## 2020-04-01 ENCOUNTER — Other Ambulatory Visit: Payer: Self-pay | Admitting: *Deleted

## 2020-04-01 DIAGNOSIS — F17210 Nicotine dependence, cigarettes, uncomplicated: Secondary | ICD-10-CM

## 2020-04-01 DIAGNOSIS — Z87891 Personal history of nicotine dependence: Secondary | ICD-10-CM

## 2020-04-02 ENCOUNTER — Other Ambulatory Visit: Payer: Self-pay

## 2020-04-02 ENCOUNTER — Encounter: Payer: Self-pay | Admitting: Podiatry

## 2020-04-02 ENCOUNTER — Encounter: Payer: Self-pay | Admitting: Cardiology

## 2020-04-02 ENCOUNTER — Ambulatory Visit: Payer: Medicare Other | Admitting: Podiatry

## 2020-04-02 ENCOUNTER — Telehealth: Payer: Self-pay | Admitting: Cardiology

## 2020-04-02 ENCOUNTER — Ambulatory Visit: Payer: Medicare Other | Admitting: Cardiology

## 2020-04-02 VITALS — BP 145/68 | HR 61 | Resp 16

## 2020-04-02 VITALS — HR 58 | Ht 68.0 in | Wt 134.6 lb

## 2020-04-02 DIAGNOSIS — I739 Peripheral vascular disease, unspecified: Secondary | ICD-10-CM

## 2020-04-02 DIAGNOSIS — I2510 Atherosclerotic heart disease of native coronary artery without angina pectoris: Secondary | ICD-10-CM | POA: Diagnosis not present

## 2020-04-02 DIAGNOSIS — L03119 Cellulitis of unspecified part of limb: Secondary | ICD-10-CM | POA: Diagnosis not present

## 2020-04-02 DIAGNOSIS — L03032 Cellulitis of left toe: Secondary | ICD-10-CM | POA: Diagnosis not present

## 2020-04-02 DIAGNOSIS — L02619 Cutaneous abscess of unspecified foot: Secondary | ICD-10-CM | POA: Diagnosis not present

## 2020-04-02 MED ORDER — NEOMYCIN-POLYMYXIN-HC 1 % OT SOLN: OTIC | 1 refills | Status: DC

## 2020-04-02 NOTE — Progress Notes (Signed)
Cardiology Office Note:    Date:  04/02/2020   ID:  Raymond Carney, DOB September 01, 1961, MRN [id number]  PCP:  Raymond Contras, MD  [facility] Cardiologist:  Raymond Furbish, MD  [facility] Electrophysiologist:  None   Referring MD: Raymond Contras, MD   Here for the follow-up of coronary artery disease  History of Present Illness:    Raymond Carney is a 59 y.o. male with hospitalization December 2020 with type II myocardial infarction in the setting of severe GI bleed anemia (supply demand mismatch/demand ischemia) left heart cath showing CTO of left circumflex and obstructive proximal LAD disease nonobstructive disease in the RCA.  No symptoms of chest pain.  This heart cath was done in the setting of myocardial infarction.  Echocardiogram 09/28/2019 showed EF 50 to 55%  He was also seen by Dr. Donnetta Carney of vascular surgery given his severe peripheral vascular disease.  Left leg discomfort in the setting of severe anemia.  He also has severe right subclavian stenosis, difficult to obtain blood pressure in that arm.  Has severe carotid disease as well, see angiogram  Overall he is still continuing to smoke.  Denies any chest pain, no significant shortness of breath.  Repeat EGD shows healing ulcers.  Reassuring.   Past Medical History:  Diagnosis Date  . Carotid stenosis    [place] 4098:  R 11-91; LICA occluded; prox L subcl occluded; R subcl > 50%  . COPD (chronic obstructive pulmonary disease) ([place])   . History of stroke    Echo 7/16:  EF 60-65  . Hyperlipidemia   . Pituitary tumor    s/p excision ~ 2000  . Stenosis of right subclavian artery ([place])    S/P STENT FOLLOWED BY DR. Leonie Carney AND DR. Estanislado Carney   . TIA (transient ischemic attack)     Past Surgical History:  Procedure Laterality Date  . bilateral hip replacements    . ESOPHAGOGASTRODUODENOSCOPY (EGD) WITH PROPOFOL N/A 09/29/2019   Procedure: ESOPHAGOGASTRODUODENOSCOPY (EGD) WITH PROPOFOL;  Surgeon: Yetta Flock, MD;   Location: [place];  Service: Gastroenterology;  Laterality: N/A;  . ESOPHAGOGASTRODUODENOSCOPY (EGD) WITH PROPOFOL N/A 02/18/2020   Procedure: ESOPHAGOGASTRODUODENOSCOPY (EGD) WITH PROPOFOL;  Surgeon: Arta Silence, MD;  [facility];  Service: Endoscopy;  Laterality: N/A;  . HIP ARTHROPLASTY Bilateral    ON DISABILITY SECONDARY   . IR ANGIO INTRA EXTRACRAN SEL COM CAROTID INNOMINATE UNI R MOD SED  07/03/2019  . IR ANGIOGRAM EXTREMITY RIGHT  07/03/2019  . LEFT HEART CATH AND CORONARY ANGIOGRAPHY N/A 09/30/2019   Procedure: LEFT HEART CATH AND CORONARY ANGIOGRAPHY;  Surgeon: Belva Crome, MD;  Location: [place] [facility];  Service: Cardiovascular;  Laterality: N/A;  . OTHER SURGICAL HISTORY  1996   HISTORY OF PITUITARY TUMOR REMOVAL CIRCA  . RADIOLOGY WITH ANESTHESIA N/A 05/26/2015   Procedure: RADIOLOGY WITH ANESTHESIA/ANGIOPLASTY;  Surgeon: Luanne Bras, MD;  Location: [place];  Service: Radiology;  Laterality: N/A;  . TRANSPHENOIDAL / TRANSNASAL HYPOPHYSECTOMY / RESECTION PITUITARY TUMOR      Current Medications: Current Meds  Medication Sig  . aspirin EC 81 MG tablet Take 1 tablet (81 mg total) by mouth daily.  [name] ezetimibe (ZETIA) 10 MG tablet Take 10 mg by mouth daily.   . Multiple Vitamin (MULTIVITAMIN WITH MINERALS) TABS tablet Take 1 tablet by mouth daily.  . NEOMYCIN-POLYMYXIN-HYDROCORTISONE (CORTISPORIN) 1 % SOLN OTIC solution Apply 1-2 drops to toe BID after soaking  . pantoprazole (PROTONIX) 40 MG tablet Take 1 tablet (  40 mg total) by mouth 2 (two) times daily.  . rosuvastatin (CRESTOR) 20 MG tablet Take 20 mg by mouth daily.     Allergies:   Patient has no known allergies.   Social History   Socioeconomic History  . Marital status: Divorced    Spouse name: Not on file  . Number of children: 1  . Years of education: Not on file  . Highest education level: Not on file  Occupational History  . Occupation: Disabled  Tobacco Use  . Smoking status: Former  Smoker    Packs/day: 0.50    Quit date: 01/22/2014    Years since quitting: 6.1  . Smokeless tobacco: Never Used  Substance and Sexual Activity  . Alcohol use: No  . Drug use: Never  . Sexual activity: Not on file  Other Topics Concern  . Not on file  Social History Narrative  . Not on file   Social Determinants of Health   Financial Resource Strain:   . Difficulty of Paying Living Expenses:   Food Insecurity:   . Worried About Charity fundraiser in the Last Year:   . Arboriculturist in the Last Year:   Transportation Needs:   . Film/video editor (Medical):   [name] Lack of Transportation (Non-Medical):   Physical Activity:   . Days of Exercise per Week:   . Minutes of Exercise per Session:   Stress:   . Feeling of Stress :   Social Connections:   . Frequency of Communication with Friends and Family:   . Frequency of Social Gatherings with Friends and Family:   . Attends Religious Services:   . Active Member of Clubs or Organizations:   . Attends Archivist Meetings:   [name] Marital Status:      Family History: The patient's family history includes Atrial fibrillation in his mother; CAD in his father and mother; CVA in his father; Colon cancer (age of onset: 102) in his sister; Congestive Heart Failure in his mother; Hypertension in his father and mother; Prostate cancer in his father; Stroke in his father. There is no history of Colon polyps or Liver cancer.  ROS:   Please see the history of present illness.     All other systems reviewed and are negative.  EKGs/Labs/Other Studies Reviewed:    The following studies were reviewed today:  TTE 09/28/2019 1. Left ventricular ejection fraction, by visual estimation, is 50 to 55%. The left ventricle has normal function. There is moderately increased left ventricular hypertrophy. 2. Left ventricular diastolic parameters are consistent with Grade II diastolic dysfunction (pseudonormalization). 3. Global right  ventricle has normal systolic function.The right ventricular size is normal. 4. Left atrial size was normal. 5. Right atrial size was normal. 6. The mitral valve is normal in structure. Mild mitral valve regurgitation. No evidence of mitral stenosis. 7. The tricuspid valve is normal in structure. Tricuspid valve regurgitation is mild. 8. The aortic valve is tricuspid. Aortic valve regurgitation is not visualized. Mild aortic valve sclerosis without stenosis. 9. The pulmonic valve was normal in structure. Pulmonic valve regurgitation is not visualized. 10. Mildly elevated pulmonary artery systolic pressure. 11. The inferior vena cava is normal in size with greater than 50% respiratory variability, suggesting right atrial pressure of 3 mmHg. 12. Hypokinesis of the inferolateral wall with overall preserved LV function; moderate LVH; grade 2 diastolic dysfunction; mild MR and TR; mildly elevated pulmonary pressure.  LHC 09/30/2019  Severe diffuse three-vessel coronary calcification.  Widely patent left main  Segmental 85% proximal to mid LAD. Mid to distal eccentric 85% LAD.  100% ostial to proximal circumflex. Fills via left to left collaterals.  Severe diffuse proximal to distal right coronary disease with eccentric 75% mid vessel stenosis.  Normal LV size and function. EF 60%. LVEDP is normal.     Recent Labs: 09/29/2019: ALT 17; Magnesium 2.0 10/01/2019: BUN 9; Creatinine, Ser 0.90; Hemoglobin 11.2; Platelets 215; Potassium 3.4; Sodium 139  Recent Lipid Panel    Component Value Date/Time   CHOL 119 10/01/2019 0414   TRIG 83 10/01/2019 0414   HDL 27 (L) 10/01/2019 0414   CHOLHDL 4.4 10/01/2019 0414   VLDL 17 10/01/2019 0414   LDLCALC 75 10/01/2019 0414    Physical Exam:    VS:  Pulse (!) 58   Ht 5\' 8"  (1.727 m)   Wt 134 lb 9.6 oz (61.1 kg)   SpO2 96%   BMI 20.47 kg/m     Wt Readings from Last 3 Encounters:  04/02/20 134 lb 9.6 oz (61.1 kg)  02/18/20 150  lb (68 kg)  12/22/19 143 lb 3.2 oz (65 kg)     GEN:  Thin, in no acute distress HEENT: Normal NECK: No JVD; Right carotid bruit LYMPHATICS: No lymphadenopathy CARDIAC: RRR, 2/6 HSM, no rubs, gallops RESPIRATORY:  Clear to auscultation without rales, wheezing or rhonchi  ABDOMEN: Soft, non-tender, non-distended MUSCULOSKELETAL:  No edema; No deformity  SKIN: Warm and dry NEUROLOGIC:  Alert and oriented x 3 PSYCHIATRIC:  Normal affect   ASSESSMENT:    1. PAD (peripheral artery disease) ([place])   2. Coronary artery disease involving native coronary artery of native heart without angina pectoris    PLAN:    In order of problems listed above:   Type II myocardial infarction/severe coronary artery disease -Diffuse ST segment depressions noted concerning for global ischemia .  Left heart catheterization showed CTO of the circumflex 85% proximal to mid LAD and nonobstructive disease in the RCA.  With no symptoms of chest pain and no significant left main disease no PCI was performed.  No dual antiplatelet therapy because of significant gastric ulcers.  Aspirin 81 mg at the discretion of gastroenterology.  Crestor was added home Zetia.  LDL 75 recently.  No beta-blocker, no anginal symptoms and prior bradycardia.  Ultimately, he is at high risk for future cardiovascular events.  Challenging to obtain blood pressure because of subclavian stenosis in right arm.  Severe peripheral vascular disease -Seen by Dr. Sherren Mocha Carney 09/28/2019 in the hospital setting.  Chronic leg left leg ischemia made worse by prior hypotension in the setting of severe anemia.  He was seen prior to that in July 2019, office note reviewed.   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 He does have extensive common femoral disease as well. His superior mesenteric and celiac arteries are widely patent For now, certainly encourage smoking cessation. We will have him see Dr.  Donnetta Carney once again.  Tobacco cessation discussed.  GI bleed/severe anemia -Hemoglobin resolved 13.1 outside labs checked from 10/21/2019.  Also ALT was 17, creatinine 0.85 prior LDL in July was 125. EGD showed nonbleeding ulcers  No new recommendations at this time.   Medication Adjustments/Labs and Tests Ordered: Current medicines are reviewed at length with the patient today.  Concerns regarding medicines are outlined above.  Orders Placed This Encounter  Procedures  . Ambulatory referral to Vascular Surgery   No orders  of the defined types were placed in this encounter.   Patient Instructions  Medication Instructions:  The current medical regimen is effective;  continue present plan and medications.  *If you need a refill on your cardiac medications before your next appointment, please call your pharmacy*  You have been referred to Dr Raymond Carney.  Follow-Up: At [facility], you and your health needs are our priority.  As part of our continuing mission to provide you with exceptional heart care, we have created designated Provider Care Teams.  These Care Teams include your primary Cardiologist (physician) and Advanced Practice Providers (APPs -  Physician Assistants and Nurse Practitioners) who all work together to provide you with the care you need, when you need it.  We recommend signing up for the patient portal called "MyChart".  Sign up information is provided on this After Visit Summary.  MyChart is used to connect with patients for Virtual Visits (Telemedicine).  Patients are able to view lab/test results, encounter notes, upcoming appointments, etc.  Non-urgent messages can be sent to your provider as well.   To learn more about what you can do with MyChart, go to [email or web address].    Your next appointment:   12 month(s)  The format for your next appointment:   In Person  Provider:   Candee Furbish, MD    Thank you for choosing [facility]!!        Signed, Raymond Furbish, MD  04/02/2020 3:46 PM    [place]

## 2020-04-02 NOTE — Patient Instructions (Signed)
Medication Instructions:  The current medical regimen is effective;  continue present plan and medications.  *If you need a refill on your cardiac medications before your next appointment, please call your pharmacy*  You have been referred to Dr Sherren Mocha Early.  Follow-Up: At [facility], you and your health needs are our priority.  As part of our continuing mission to provide you with exceptional heart care, we have created designated Provider Care Teams.  These Care Teams include your primary Cardiologist (physician) and Advanced Practice Providers (APPs -  Physician Assistants and Nurse Practitioners) who all work together to provide you with the care you need, when you need it.  We recommend signing up for the patient portal called "MyChart".  Sign up information is provided on this After Visit Summary.  MyChart is used to connect with patients for Virtual Visits (Telemedicine).  Patients are able to view lab/test results, encounter notes, upcoming appointments, etc.  Non-urgent messages can be sent to your provider as well.   To learn more about what you can do with MyChart, go to [email or web address].    Your next appointment:   12 month(s)  The format for your next appointment:   In Person  Provider:   Candee Furbish, MD    Thank you for choosing [facility]!!

## 2020-04-02 NOTE — Telephone Encounter (Signed)
New message   Patient's sister states that she will be coming with the patient to Dr. Marlou Porch  appt he has had a stroke.

## 2020-04-02 NOTE — Telephone Encounter (Signed)
Noted - note place in appt notes

## 2020-04-02 NOTE — Patient Instructions (Signed)

## 2020-04-02 NOTE — Progress Notes (Signed)
Subjective:  Patient ID: Raymond Carney, male    DOB: [birth year],  MRN: [id number] HPI Chief Complaint  Patient presents with   Toe Pain    Hallux left - medial border, tender, red, swollen x 1 month, tried soaking in epsom, just completed cephalexin, his sister is with him today and she says he has bad vascular disease   New Patient (Initial Visit)    59 y.o. male presents with the above complaint.   ROS: Denies fever chills nausea vomiting muscle aches pains calf pain back pain chest pain shortness of breath.  Past Medical History:  Diagnosis Date   Carotid stenosis    [place] 0932:  R 35-57; LICA occluded; prox L subcl occluded; R subcl > 50%   COPD (chronic obstructive pulmonary disease) ([facility])    History of stroke    Echo 7/16:  EF 60-65   Hyperlipidemia    Pituitary tumor    s/p excision ~ 2000   Stenosis of right subclavian artery ([place])    S/P STENT FOLLOWED BY DR. Leonie Man AND DR. [name]    TIA (transient ischemic attack)    Past Surgical History:  Procedure Laterality Date   bilateral hip replacements     ESOPHAGOGASTRODUODENOSCOPY (EGD) WITH PROPOFOL N/A 09/29/2019   Procedure: ESOPHAGOGASTRODUODENOSCOPY (EGD) WITH PROPOFOL;  Surgeon: Yetta Flock, MD;  Location: [place];  Service: Gastroenterology;  Laterality: N/A;   ESOPHAGOGASTRODUODENOSCOPY (EGD) WITH PROPOFOL N/A 02/18/2020   Procedure: ESOPHAGOGASTRODUODENOSCOPY (EGD) WITH PROPOFOL;  Surgeon: Arta Silence, MD;  [facility];  Service: Endoscopy;  Laterality: N/A;   HIP ARTHROPLASTY Bilateral    ON DISABILITY SECONDARY    IR ANGIO INTRA EXTRACRAN SEL COM CAROTID INNOMINATE UNI R MOD SED  07/03/2019   IR ANGIOGRAM EXTREMITY RIGHT  07/03/2019   LEFT HEART CATH AND CORONARY ANGIOGRAPHY N/A 09/30/2019   Procedure: LEFT HEART CATH AND CORONARY ANGIOGRAPHY;  Surgeon: Belva Crome, MD;  Location: [place] [facility];  Service: Cardiovascular;  Laterality: N/A;   OTHER SURGICAL HISTORY   1996   HISTORY OF PITUITARY TUMOR REMOVAL CIRCA   RADIOLOGY WITH ANESTHESIA N/A 05/26/2015   Procedure: RADIOLOGY WITH ANESTHESIA/ANGIOPLASTY;  Surgeon: Luanne Bras, MD;  Location: [place];  Service: Radiology;  Laterality: N/A;   TRANSPHENOIDAL / TRANSNASAL HYPOPHYSECTOMY / RESECTION PITUITARY TUMOR      Current Outpatient Medications:    aspirin EC 81 MG tablet, Take 1 tablet (81 mg total) by mouth daily., Disp: 90 tablet, Rfl: 3   ezetimibe (ZETIA) 10 MG tablet, Take 10 mg by mouth daily. , Disp: , Rfl:    Multiple Vitamin (MULTIVITAMIN WITH MINERALS) TABS tablet, Take 1 tablet by mouth daily., Disp: , Rfl:    NEOMYCIN-POLYMYXIN-HYDROCORTISONE (CORTISPORIN) 1 % SOLN OTIC solution, Apply 1-2 drops to toe BID after soaking, Disp: 10 mL, Rfl: 1   pantoprazole (PROTONIX) 40 MG tablet, Take 1 tablet (40 mg total) by mouth 2 (two) times daily., Disp: 60 tablet, Rfl: 2   rosuvastatin (CRESTOR) 20 MG tablet, Take 20 mg by mouth daily., Disp: , Rfl:   No Known Allergies Review of Systems Objective:   Vitals:   04/02/20 1301  BP: (!) 145/68  Pulse: 61  Resp: 16    General: Well developed, nourished, in no acute distress, alert and oriented x3   Dermatological: Skin is warm, dry and supple bilateral. Nails x 10 are well maintained; remaining integument appears unremarkable at this time. There are no open sores, no preulcerative lesions, no rash  or signs of infection present.  Has a small area of blood and pus collected under small central lesion of the plantar lateral heel left appears to be a puncture wound that has developed into a small area of abscess.  I once this was opened and drained and removed it measured about 5 mm in diameter did not see any foreign body and it did not look like it probe deep into the foot.  If this is not resolved by next visit we will x-ray again.  Also has a sharp incurvated nail margin along the medial border of the hallux left.  There is mild erythema  no purulence no malodor.  Vascular: Dorsalis Pedis artery and Posterior Tibial artery pedal pulses are 1/4 bilateral with immedate capillary fill time. Pedal hair growth present. No varicosities and no lower extremity edema present bilateral.   Neruologic: Grossly intact via light touch bilateral. Vibratory intact via tuning fork bilateral. Protective threshold with Semmes Wienstein monofilament intact to all pedal sites bilateral. Patellar and Achilles deep tendon reflexes 2+ bilateral. No Babinski or clonus noted bilateral.   Musculoskeletal: No gross boney pedal deformities bilateral. No pain, crepitus, or limitation noted with foot and ankle range of motion bilateral. Muscular strength 5/5 in all groups tested bilateral.  Gait: Unassisted, Nonantalgic.    Radiographs:  None taken  Assessment & Plan:   Assessment: Small abscess plantar heel left.  Ingrown nail medial border hallux left.  Plan: The small area on the heel was prepped with Betadine and then debrided.  There is small amount of dried blood and semipurulent material and there no foreign body was identified.  Because of his vascular status we performed a incision and drainage to the toe as well he tolerated this well after local anesthetic.     Lior Hoen T. [place], [place]

## 2020-04-22 ENCOUNTER — Ambulatory Visit: Payer: Medicare Other | Admitting: Podiatry

## 2020-05-10 ENCOUNTER — Other Ambulatory Visit: Payer: Self-pay

## 2020-05-10 ENCOUNTER — Encounter: Payer: Self-pay | Admitting: Acute Care

## 2020-05-10 ENCOUNTER — Ambulatory Visit (INDEPENDENT_AMBULATORY_CARE_PROVIDER_SITE_OTHER): Payer: Medicare Other | Admitting: Acute Care

## 2020-05-10 ENCOUNTER — Ambulatory Visit
Admission: RE | Admit: 2020-05-10 | Discharge: 2020-05-10 | Disposition: A | Discharge status: Home / Self Care | Payer: Medicare Other | Source: Ambulatory Visit | Attending: Acute Care | Admitting: Acute Care

## 2020-05-10 DIAGNOSIS — F17210 Nicotine dependence, cigarettes, uncomplicated: Secondary | ICD-10-CM

## 2020-05-10 DIAGNOSIS — Z122 Encounter for screening for malignant neoplasm of respiratory organs: Secondary | ICD-10-CM

## 2020-05-10 DIAGNOSIS — Z87891 Personal history of nicotine dependence: Secondary | ICD-10-CM

## 2020-05-10 NOTE — Progress Notes (Signed)
Shared Decision Making Visit Lung Cancer Screening Program [phone])   Eligibility:  Age 59 y.o.  Pack Years Smoking History Calculation 50 pack year smoking history (# packs/per year x # years smoked)  Recent History of coughing up blood  no  Unexplained weight loss? no ( >Than 15 pounds within the last 6 months )  Prior History Lung / other cancer no (Diagnosis within the last 5 years already requiring surveillance chest CT Scans).  Smoking Status Current Smoker  Former Smokers: Years since quit: NA  Quit Date: NA  Visit Components:  Discussion included one or more decision making aids. yes  Discussion included risk/benefits of screening. yes  Discussion included potential follow up diagnostic testing for abnormal scans. yes  Discussion included meaning and risk of over diagnosis. yes  Discussion included meaning and risk of False Positives. yes  Discussion included meaning of total radiation exposure. yes  Counseling Included:  Importance of adherence to annual lung cancer LDCT screening. yes  Impact of comorbidities on ability to participate in the program. yes  Ability and willingness to under diagnostic treatment. yes  Smoking Cessation Counseling:  Current Smokers:   Discussed importance of smoking cessation. yes  Information about tobacco cessation classes and interventions provided to patient. yes  Patient provided with "ticket" for LDCT Scan. yes  Symptomatic Patient. no  Counseling NA  Diagnosis Code: Tobacco Use Z72.0  Asymptomatic Patient yes  Counseling (Intermediate counseling: > three minutes counseling) I3382  Former Smokers:   Discussed the importance of maintaining cigarette abstinence. yes  Diagnosis Code: Personal History of Nicotine Dependence. N05.397  Information about tobacco cessation classes and interventions provided to patient. Yes  Patient provided with "ticket" for LDCT Scan. yes  Written Order for Lung Cancer  Screening with LDCT placed in Epic. Yes (CT Chest Lung Cancer Screening Low Dose W/O CM) [id number] Z12.2-Screening of respiratory organs Z87.891-Personal history of nicotine dependence  I have spent 25 minutes of face to face time with Raymond Carney discussing the risks and benefits of lung cancer screening. We viewed a power point together that explained in detail the above noted topics. We paused at intervals to allow for questions to be asked and answered to ensure understanding.We discussed that the single most powerful action that he can take to decrease his risk of developing lung cancer is to quit smoking. We discussed whether or not he is ready to commit to setting a quit date. We discussed options for tools to aid in quitting smoking including nicotine replacement therapy, non-nicotine medications, support groups, Quit Smart classes, and behavior modification. We discussed that often times setting smaller, more achievable goals, such as eliminating 1 cigarette a day for a week and then 2 cigarettes a day for a week can be helpful in slowly decreasing the number of cigarettes smoked. This allows for a sense of accomplishment as well as providing a clinical benefit. I gave him the " Be Stronger Than Your Excuses" card with contact information for community resources, classes, free nicotine replacement therapy, and access to mobile apps, text messaging, and on-line smoking cessation help. I have also given him my card and contact information in the event he needs to contact me. We discussed the time and location of the scan, and that either [name] RN or I will call with the results within 24-48 hours of receiving them. I have offered him  a copy of the power point we viewed  as a resource in the event they need reinforcement  of the concepts we discussed today in the office. The patient verbalized understanding of all of  the above and had no further questions upon leaving the office. They have my  contact information in the event they have any further questions.  I spent 3 minutes counseling on smoking cessation and the health risks of continued tobacco abuse.  I explained to the patient that there has been a high incidence of coronary artery disease noted on these exams. I explained that this is a non-gated exam therefore degree or severity cannot be determined. This patient is currently on statin therapy. I have asked the patient to follow-up with their PCP regarding any incidental finding of coronary artery disease and management with diet or medication as their PCP  feels is clinically indicated. The patient verbalized understanding of the above and had no further questions upon completion of the visit.    Magdalen Spatz, NP 05/10/2020

## 2020-05-10 NOTE — Patient Instructions (Signed)
Thank you for participating in the [facility]. It was our pleasure to meet you today. We will call you with the results of your scan within the next few days. Your scan will be assigned a Lung RADS category score by the physicians reading the scans.  This Lung RADS score determines follow up scanning.  See below for description of categories, and follow up screening recommendations. We will be in touch to schedule your follow up screening annually or based on recommendations of our providers. We will fax a copy of your scan results to your Primary Care Physician, or the physician who referred you to the program, to ensure they have the results. Please call the office if you have any questions or concerns regarding your scanning experience or results.  Our office number is [phone]. Please speak with Denise Phelps, RN. She is our Lung Cancer Screening RN. If she is unavailable when you call, please have the office staff send her a message. She will return your call at her earliest convenience. Remember, if your scan is normal, we will scan you annually as long as you continue to meet the criteria for the program. (Age 61-77, Current smoker or smoker who has quit within the last 15 years). If you are a smoker, remember, quitting is the single most powerful action that you can take to decrease your risk of lung cancer and other pulmonary, breathing related problems. We know quitting is hard, and we are here to help.  Please let us know if there is anything we can do to help you meet your goal of quitting. If you are a former smoker, congratulations. We are proud of you! Remain smoke free! Remember you can refer friends or family members through the number above.  We will screen them to make sure they meet criteria for the program. Thank you for helping us take better care of you by participating in Lung Screening.  Lung RADS Categories:  Lung RADS 1: no nodules  or definitely non-concerning nodules.  Recommendation is for a repeat annual scan in 12 months.  Lung RADS 2:  nodules that are non-concerning in appearance and behavior with a very low likelihood of becoming an active cancer. Recommendation is for a repeat annual scan in 12 months.  Lung RADS 3: nodules that are probably non-concerning , includes nodules with a low likelihood of becoming an active cancer.  Recommendation is for a 6-month repeat screening scan. Often noted after an upper respiratory illness. We will be in touch to make sure you have no questions, and to schedule your 6-month scan.  Lung RADS 4 A: nodules with concerning findings, recommendation is most often for a follow up scan in 3 months or additional testing based on our provider's assessment of the scan. We will be in touch to make sure you have no questions and to schedule the recommended 3 month follow up scan.  Lung RADS 4 B:  indicates findings that are concerning. We will be in touch with you to schedule additional diagnostic testing based on our provider's  assessment of the scan.   

## 2020-05-11 NOTE — Progress Notes (Signed)
Please call patient and let them  know their  low dose Ct was read as a Lung RADS 2: nodules that are benign in appearance and behavior with a very low likelihood of becoming a clinically active cancer due to size or lack of growth. Recommendation per radiology is for a repeat LDCT in 12 months. .Please let them  know we will order and schedule their  annual screening scan for 04/2021. Please let them  know there was notation of CAD on their  scan.  Please remind the patient  that this is a non-gated exam therefore degree or severity of disease  cannot be determined. Please have them  follow up with their PCP regarding potential risk factor modification, dietary therapy or pharmacologic therapy if clinically indicated. Pt.  is  currently on statin therapy. Please place order for annual  screening scan for  04/2021 and fax results to PCP. Thanks so much. 

## 2020-05-12 ENCOUNTER — Telehealth: Payer: Self-pay | Admitting: Acute Care

## 2020-05-12 DIAGNOSIS — F17210 Nicotine dependence, cigarettes, uncomplicated: Secondary | ICD-10-CM

## 2020-05-13 NOTE — Telephone Encounter (Signed)
LMTC x 1  

## 2020-05-13 NOTE — Telephone Encounter (Signed)
Pt informed of CT results per Sarah Groce, NP.  PT verbalized understanding.  Copy sent to PCP.  Order placed for 1 yr f/u CT.  

## 2020-06-22 ENCOUNTER — Ambulatory Visit: Payer: Medicare Other | Admitting: Adult Health

## 2020-06-23 ENCOUNTER — Other Ambulatory Visit: Payer: Self-pay

## 2020-06-23 DIAGNOSIS — I739 Peripheral vascular disease, unspecified: Secondary | ICD-10-CM

## 2020-07-02 ENCOUNTER — Ambulatory Visit: Payer: Medicare Other | Admitting: Vascular Surgery

## 2020-07-02 ENCOUNTER — Encounter (HOSPITAL_COMMUNITY): Payer: Medicare Other

## 2020-10-10 ENCOUNTER — Emergency Department (HOSPITAL_COMMUNITY): Payer: Medicare Other

## 2020-10-10 ENCOUNTER — Inpatient Hospital Stay (HOSPITAL_COMMUNITY)
Admission: EM | Admit: 2020-10-10 | Discharge: 2020-10-14 | DRG: 280 | Disposition: A | Discharge status: Home / Self Care | Payer: Medicare Other | Attending: Internal Medicine | Admitting: Internal Medicine

## 2020-10-10 ENCOUNTER — Other Ambulatory Visit: Payer: Self-pay

## 2020-10-10 DIAGNOSIS — J440 Chronic obstructive pulmonary disease with acute lower respiratory infection: Secondary | ICD-10-CM | POA: Diagnosis present

## 2020-10-10 DIAGNOSIS — I4891 Unspecified atrial fibrillation: Secondary | ICD-10-CM

## 2020-10-10 DIAGNOSIS — I2510 Atherosclerotic heart disease of native coronary artery without angina pectoris: Secondary | ICD-10-CM | POA: Diagnosis present

## 2020-10-10 DIAGNOSIS — I358 Other nonrheumatic aortic valve disorders: Secondary | ICD-10-CM | POA: Diagnosis present

## 2020-10-10 DIAGNOSIS — R570 Cardiogenic shock: Secondary | ICD-10-CM | POA: Diagnosis present

## 2020-10-10 DIAGNOSIS — I480 Paroxysmal atrial fibrillation: Secondary | ICD-10-CM | POA: Diagnosis present

## 2020-10-10 DIAGNOSIS — Z79899 Other long term (current) drug therapy: Secondary | ICD-10-CM

## 2020-10-10 DIAGNOSIS — I110 Hypertensive heart disease with heart failure: Secondary | ICD-10-CM | POA: Diagnosis present

## 2020-10-10 DIAGNOSIS — J9601 Acute respiratory failure with hypoxia: Secondary | ICD-10-CM

## 2020-10-10 DIAGNOSIS — I5043 Acute on chronic combined systolic (congestive) and diastolic (congestive) heart failure: Secondary | ICD-10-CM | POA: Diagnosis present

## 2020-10-10 DIAGNOSIS — Z7982 Long term (current) use of aspirin: Secondary | ICD-10-CM

## 2020-10-10 DIAGNOSIS — F17210 Nicotine dependence, cigarettes, uncomplicated: Secondary | ICD-10-CM | POA: Diagnosis present

## 2020-10-10 DIAGNOSIS — Z20822 Contact with and (suspected) exposure to covid-19: Secondary | ICD-10-CM | POA: Diagnosis present

## 2020-10-10 DIAGNOSIS — E876 Hypokalemia: Secondary | ICD-10-CM | POA: Diagnosis present

## 2020-10-10 DIAGNOSIS — I214 Non-ST elevation (NSTEMI) myocardial infarction: Secondary | ICD-10-CM | POA: Diagnosis not present

## 2020-10-10 DIAGNOSIS — Z96643 Presence of artificial hip joint, bilateral: Secondary | ICD-10-CM | POA: Diagnosis present

## 2020-10-10 DIAGNOSIS — R778 Other specified abnormalities of plasma proteins: Secondary | ICD-10-CM

## 2020-10-10 DIAGNOSIS — E785 Hyperlipidemia, unspecified: Secondary | ICD-10-CM | POA: Diagnosis present

## 2020-10-10 DIAGNOSIS — I255 Ischemic cardiomyopathy: Secondary | ICD-10-CM | POA: Diagnosis present

## 2020-10-10 DIAGNOSIS — J189 Pneumonia, unspecified organism: Secondary | ICD-10-CM

## 2020-10-10 DIAGNOSIS — J9690 Respiratory failure, unspecified, unspecified whether with hypoxia or hypercapnia: Secondary | ICD-10-CM

## 2020-10-10 DIAGNOSIS — I739 Peripheral vascular disease, unspecified: Secondary | ICD-10-CM | POA: Diagnosis present

## 2020-10-10 DIAGNOSIS — Z452 Encounter for adjustment and management of vascular access device: Secondary | ICD-10-CM

## 2020-10-10 DIAGNOSIS — N179 Acute kidney failure, unspecified: Secondary | ICD-10-CM | POA: Diagnosis not present

## 2020-10-10 DIAGNOSIS — Z8673 Personal history of transient ischemic attack (TIA), and cerebral infarction without residual deficits: Secondary | ICD-10-CM

## 2020-10-10 LAB — CBC WITH DIFFERENTIAL/PLATELET
Abs Immature Granulocytes: 0.13 10*3/uL — ABNORMAL HIGH (ref 0.00–0.07)
Basophils Absolute: 0.1 10*3/uL (ref 0.0–0.1)
Basophils Relative: 0 %
Eosinophils Absolute: 0 10*3/uL (ref 0.0–0.5)
Eosinophils Relative: 0 %
HCT: 39.7 % (ref 39.0–52.0)
Hemoglobin: 12.6 g/dL — ABNORMAL LOW (ref 13.0–17.0)
Immature Granulocytes: 1 %
Lymphocytes Relative: 7 %
Lymphs Abs: 1.3 10*3/uL (ref 0.7–4.0)
MCH: 28.1 pg (ref 26.0–34.0)
MCHC: 31.7 g/dL (ref 30.0–36.0)
MCV: 88.4 fL (ref 80.0–100.0)
Monocytes Absolute: 1.3 10*3/uL — ABNORMAL HIGH (ref 0.1–1.0)
Monocytes Relative: 7 %
Neutro Abs: 16.2 10*3/uL — ABNORMAL HIGH (ref 1.7–7.7)
Neutrophils Relative %: 85 %
Platelets: 240 10*3/uL (ref 150–400)
RBC: 4.49 MIL/uL (ref 4.22–5.81)
RDW: 14.7 % (ref 11.5–15.5)
WBC: 19 10*3/uL — ABNORMAL HIGH (ref 4.0–10.5)
nRBC: 0 % (ref 0.0–0.2)

## 2020-10-10 LAB — I-STAT VENOUS BLOOD GAS, ED
Acid-base deficit: 2 mmol/L (ref 0.0–2.0)
Bicarbonate: 22.8 mmol/L (ref 20.0–28.0)
Calcium, Ion: 1.04 mmol/L — ABNORMAL LOW (ref 1.15–1.40)
HCT: 35 % — ABNORMAL LOW (ref 39.0–52.0)
Hemoglobin: 11.9 g/dL — ABNORMAL LOW (ref 13.0–17.0)
O2 Saturation: 64 %
Potassium: 3.7 mmol/L (ref 3.5–5.1)
Sodium: 142 mmol/L (ref 135–145)
TCO2: 24 mmol/L (ref 22–32)
pCO2, Ven: 40.4 mmHg — ABNORMAL LOW (ref 44.0–60.0)
pH, Ven: 7.36 (ref 7.250–7.430)
pO2, Ven: 34 mmHg (ref 32.0–45.0)

## 2020-10-10 LAB — TROPONIN I (HIGH SENSITIVITY): Troponin I (High Sensitivity): 637 ng/L (ref <18)

## 2020-10-10 LAB — URINALYSIS, ROUTINE W REFLEX MICROSCOPIC
Bilirubin Urine: NEGATIVE
Glucose, UA: 50 mg/dL — AB
Ketones, ur: NEGATIVE mg/dL
Leukocytes,Ua: NEGATIVE
Nitrite: NEGATIVE
Protein, ur: 100 mg/dL — AB
Specific Gravity, Urine: 1.016 (ref 1.005–1.030)
pH: 5 (ref 5.0–8.0)

## 2020-10-10 LAB — CBG MONITORING, ED: Glucose-Capillary: 97 mg/dL (ref 70–99)

## 2020-10-10 LAB — I-STAT ARTERIAL BLOOD GAS, ED
Acid-base deficit: 4 mmol/L — ABNORMAL HIGH (ref 0.0–2.0)
Bicarbonate: 21.6 mmol/L (ref 20.0–28.0)
Calcium, Ion: 1.1 mmol/L — ABNORMAL LOW (ref 1.15–1.40)
HCT: 35 % — ABNORMAL LOW (ref 39.0–52.0)
Hemoglobin: 11.9 g/dL — ABNORMAL LOW (ref 13.0–17.0)
O2 Saturation: 87 %
Potassium: 4.2 mmol/L (ref 3.5–5.1)
Sodium: 140 mmol/L (ref 135–145)
TCO2: 23 mmol/L (ref 22–32)
pCO2 arterial: 38.1 mmHg (ref 32.0–48.0)
pH, Arterial: 7.36 (ref 7.350–7.450)
pO2, Arterial: 55 mmHg — ABNORMAL LOW (ref 83.0–108.0)

## 2020-10-10 LAB — RESP PANEL BY RT-PCR (FLU A&B, COVID) ARPGX2
Influenza A by PCR: NEGATIVE
Influenza B by PCR: NEGATIVE
SARS Coronavirus 2 by RT PCR: NEGATIVE

## 2020-10-10 LAB — COMPREHENSIVE METABOLIC PANEL
ALT: 25 U/L (ref 0–44)
AST: 52 U/L — ABNORMAL HIGH (ref 15–41)
Albumin: 3.2 g/dL — ABNORMAL LOW (ref 3.5–5.0)
Alkaline Phosphatase: 57 U/L (ref 38–126)
Anion gap: 14 (ref 5–15)
BUN: 15 mg/dL (ref 8–23)
CO2: 21 mmol/L — ABNORMAL LOW (ref 22–32)
Calcium: 8.6 mg/dL — ABNORMAL LOW (ref 8.9–10.3)
Chloride: 106 mmol/L (ref 98–111)
Creatinine, Ser: 1.34 mg/dL — ABNORMAL HIGH (ref 0.61–1.24)
GFR, Estimated: >60 mL/min (ref >60)
Glucose, Bld: 104 mg/dL — ABNORMAL HIGH (ref 70–99)
Potassium: 3.8 mmol/L (ref 3.5–5.1)
Sodium: 141 mmol/L (ref 135–145)
Total Bilirubin: 0.5 mg/dL (ref 0.3–1.2)
Total Protein: 6.5 g/dL (ref 6.5–8.1)

## 2020-10-10 LAB — BRAIN NATRIURETIC PEPTIDE: B Natriuretic Peptide: 847.8 pg/mL — ABNORMAL HIGH (ref 0.0–100.0)

## 2020-10-10 LAB — LACTIC ACID, PLASMA: Lactic Acid, Venous: 3.4 mmol/L (ref 0.5–1.9)

## 2020-10-10 MED ORDER — SODIUM CHLORIDE 0.9 % IV SOLN
2.0000 g | INTRAVENOUS | Status: DC
  Administered 2020-10-10 – 2020-10-13 (×4): 2 g via INTRAVENOUS
  Filled 2020-10-10: qty 2
  Filled 2020-10-10: qty 20
  Filled 2020-10-10: qty 2
  Filled 2020-10-10 (×3): qty 20

## 2020-10-10 MED ORDER — SODIUM CHLORIDE 0.9 % IV BOLUS (SEPSIS): 1000.0000 mL | Freq: Once | INTRAVENOUS | Status: DC

## 2020-10-10 MED ORDER — AZITHROMYCIN 500 MG IV SOLR
500.0000 mg | INTRAVENOUS | Status: DC
  Administered 2020-10-10 – 2020-10-13 (×4): 500 mg via INTRAVENOUS
  Filled 2020-10-10 (×6): qty 500

## 2020-10-10 MED ORDER — SODIUM CHLORIDE 0.9 % IV SOLN
250.0000 mL | INTRAVENOUS | Status: DC
  Administered 2020-10-10: 23:00:00 250 mL via INTRAVENOUS

## 2020-10-10 MED ORDER — SODIUM CHLORIDE 0.9 % IV BOLUS: 1000.0000 mL | Freq: Once | INTRAVENOUS | Status: DC

## 2020-10-10 MED ORDER — SODIUM CHLORIDE 0.9 % IV BOLUS
1000.0000 mL | Freq: Once | INTRAVENOUS | Status: AC
  Administered 2020-10-10: 22:00:00 1000 mL via INTRAVENOUS

## 2020-10-10 MED ORDER — NOREPINEPHRINE 4 MG/250ML-% IV SOLN
2.0000 ug/min | INTRAVENOUS | Status: DC
  Administered 2020-10-11: 13:00:00 7 ug/min via INTRAVENOUS
  Administered 2020-10-11: 01:00:00 4 ug/min via INTRAVENOUS
  Administered 2020-10-11: 08:00:00 7 ug/min via INTRAVENOUS
  Filled 2020-10-10 (×4): qty 250

## 2020-10-10 MED ORDER — SODIUM CHLORIDE 0.9 % IV SOLN: INTRAVENOUS | Status: DC | PRN

## 2020-10-10 NOTE — ED Triage Notes (Signed)
Pt from home by EMS; reports new onset weakness; BGL 360, but pt denies being diabetic; rhythm Afib, but pt denies hx of this rhythm.  EMS report that info given to them is in conflict with info given to family.

## 2020-10-10 NOTE — ED Provider Notes (Signed)
[facility] EMERGENCY DEPARTMENT Provider Note   CSN: [id number] Arrival date & time: 10/10/20  2053     History Chief Complaint  Patient presents with  . Weakness    Raymond Carney is a 59 y.o. male.  HPI 59 year old male presents with acute lightheadedness and weakness.  Started around 7:30 PM.  He felt like he was going to have a bowel movement but did not and was also nauseated.  Sister states that his color was very pale.  EMS found him to be in A. fib and hypotensive.  Has been given about 700 cc of IV fluid prior to me seeing him and the sister states his color is much better and he states he is feeling better.  Chronic cough and dyspnea from smoking but no new symptoms.  No chest pain today.  No headache or abdominal pain.   Past Medical History:  Diagnosis Date  . Carotid stenosis    [place] 6222:  R 97-98; LICA occluded; prox L subcl occluded; R subcl > 50%  . COPD (chronic obstructive pulmonary disease) ([place])   . History of stroke    Echo 7/16:  EF 60-65  . Hyperlipidemia   . Pituitary tumor    s/p excision ~ 2000  . Stenosis of right subclavian artery ([place])    S/P STENT FOLLOWED BY DR. Leonie Man AND DR. [name]   . TIA (transient ischemic attack)     Patient Active Problem List   Diagnosis Date Noted  . Encounter for screening for lung cancer 05/10/2020  . Seizure versus vasovagal episode   . NSTEMI in the setting of syncope 11/28 status post cardiac cath 12/1   . Acute gastric ulcer with hemorrhage   . Upper GI bleed status post EGD 1130   . Hemorrhagic shock ([place])   . Acute GI bleeding 09/27/2019  . History of stroke 07/06/2015  . Pre-operative cardiovascular examination 07/06/2015  . Stenosis of artery -multiple subclavian vessels status post right subclavian angioplasty 05/23/2015   . CVA 05/23/2015 05/23/2015  . Dysarthria   . Right sided weakness   . TIA (transient ischemic attack)     Past Surgical History:  Procedure Laterality Date   . bilateral hip replacements    . ESOPHAGOGASTRODUODENOSCOPY (EGD) WITH PROPOFOL N/A 09/29/2019   Procedure: ESOPHAGOGASTRODUODENOSCOPY (EGD) WITH PROPOFOL;  Surgeon: Yetta Flock, MD;  Location: [facility];  Service: Gastroenterology;  Laterality: N/A;  . ESOPHAGOGASTRODUODENOSCOPY (EGD) WITH PROPOFOL N/A 02/18/2020   Procedure: ESOPHAGOGASTRODUODENOSCOPY (EGD) WITH PROPOFOL;  Surgeon: Arta Silence, MD;  Location: [facility];  Service: Endoscopy;  Laterality: N/A;  . HIP ARTHROPLASTY Bilateral    ON DISABILITY SECONDARY   . IR ANGIO INTRA EXTRACRAN SEL COM CAROTID INNOMINATE UNI R MOD SED  07/03/2019  . IR ANGIOGRAM EXTREMITY RIGHT  07/03/2019  . LEFT HEART CATH AND CORONARY ANGIOGRAPHY N/A 09/30/2019   Procedure: LEFT HEART CATH AND CORONARY ANGIOGRAPHY;  Surgeon: Belva Crome, MD;  Location: [facility];  Service: Cardiovascular;  Laterality: N/A;  . OTHER SURGICAL HISTORY  1996   HISTORY OF PITUITARY TUMOR REMOVAL CIRCA  . RADIOLOGY WITH ANESTHESIA N/A 05/26/2015   Procedure: RADIOLOGY WITH ANESTHESIA/ANGIOPLASTY;  Surgeon: Luanne Bras, MD;  Location: [facility];  Service: Radiology;  Laterality: N/A;  . TRANSPHENOIDAL / TRANSNASAL HYPOPHYSECTOMY / RESECTION PITUITARY TUMOR         Family History  Problem Relation Age of Onset  . Hypertension Mother   . CAD Mother   .  Congestive Heart Failure Mother   . Atrial fibrillation Mother   . Stroke Father   . Hypertension Father   . CVA Father   . CAD Father        s/p CABG  . Prostate cancer Father   . Colon cancer Sister 73  . Colon polyps Neg Hx   . Liver cancer Neg Hx     Social History   Tobacco Use  . Smoking status: Current Every Day Smoker    Packs/day: 1.27    Years: 40.00    Pack years: 50.80    Types: Cigarettes    Last attempt to quit: 01/22/2014    Years since quitting: 6.7  . Smokeless tobacco: Never Used  Vaping Use  . Vaping Use: Never used  Substance Use Topics  . Alcohol use: No  .  Drug use: Never    Home Medications Prior to Admission medications   Medication Sig Start Date End Date Taking? Authorizing Provider  acetaminophen (TYLENOL) 325 MG tablet Take 325 mg by mouth every 6 (six) hours as needed (for discomfort).   Yes [provider]  aspirin 325 MG EC tablet Take 162.5 mg by mouth in the morning.   Yes [provider]  ezetimibe (ZETIA) 10 MG tablet Take 10 mg by mouth daily.  08/03/16  Yes [provider]  Multiple Vitamin (MULTIVITAMIN WITH MINERALS) TABS tablet Take 1 tablet by mouth 3 (three) times a week.   Yes [provider]  pantoprazole (PROTONIX) 40 MG tablet Take 1 tablet (40 mg total) by mouth 2 (two) times daily. Patient taking differently: Take 40 mg by mouth in the morning. 10/01/19  Yes Nita Sells, MD  rosuvastatin (CRESTOR) 20 MG tablet Take 20 mg by mouth daily.   Yes [provider]  aspirin EC 81 MG tablet Take 1 tablet (81 mg total) by mouth daily. 10/10/19   Nita Sells, MD  NEOMYCIN-POLYMYXIN-HYDROCORTISONE (CORTISPORIN) 1 % SOLN OTIC solution Apply 1-2 drops to toe BID after soaking Patient not taking: No sig reported 04/02/20   Tyson Dense T, DPM    Allergies    Patient has no known allergies.  Review of Systems   Review of Systems  Constitutional: Negative for fever.  Respiratory: Negative for shortness of breath.   Cardiovascular: Negative for chest pain and palpitations.  Gastrointestinal: Positive for nausea. Negative for abdominal pain, diarrhea and vomiting.  Neurological: Positive for weakness and light-headedness.  All other systems reviewed and are negative.   Physical Exam Updated Vital Signs BP (!) 89/76   Pulse (!) 109   Temp (!) 97.3 F (36.3 C) (Oral)   Resp (!) 30   SpO2 94%   Physical Exam Vitals and nursing note reviewed.  Constitutional:      General: He is not in acute distress.    Appearance: He is well-developed and well-nourished. He is  not ill-appearing or diaphoretic.  HENT:     Head: Normocephalic and atraumatic.     Right Ear: External ear normal.     Left Ear: External ear normal.     Nose: Nose normal.  Eyes:     General:        Right eye: No discharge.        Left eye: No discharge.  Cardiovascular:     Rate and Rhythm: Regular rhythm. Tachycardia present.     Heart sounds: Normal heart sounds.  Pulmonary:     Effort: Pulmonary effort is normal.  Breath sounds: Normal breath sounds.  Abdominal:     Palpations: Abdomen is soft.     Tenderness: There is no abdominal tenderness.  Musculoskeletal:        General: No edema.     Cervical back: Neck supple.  Skin:    General: Skin is warm and dry.  Neurological:     Mental Status: He is alert.     Comments: 5/5 strength in all 4 extremities  Psychiatric:        Mood and Affect: Mood is not anxious.     ED Results / Procedures / Treatments   Labs (all labs ordered are listed, but only abnormal results are displayed) Labs Reviewed  COMPREHENSIVE METABOLIC PANEL - Abnormal; Notable for the following components:      Result Value   CO2 21 (*)    Glucose, Bld 104 (*)    Creatinine, Ser 1.34 (*)    Calcium 8.6 (*)    Albumin 3.2 (*)    AST 52 (*)    All other components within normal limits  CBC WITH DIFFERENTIAL/PLATELET - Abnormal; Notable for the following components:   WBC 19.0 (*)    Hemoglobin 12.6 (*)    Neutro Abs 16.2 (*)    Monocytes Absolute 1.3 (*)    Abs Immature Granulocytes 0.13 (*)    All other components within normal limits  URINALYSIS, ROUTINE W REFLEX MICROSCOPIC - Abnormal; Notable for the following components:   Glucose, UA 50 (*)    Hgb urine dipstick SMALL (*)    Protein, ur 100 (*)    Bacteria, UA RARE (*)    All other components within normal limits  I-STAT VENOUS BLOOD GAS, ED - Abnormal; Notable for the following components:   pCO2, Ven 40.4 (*)    Calcium, Ion 1.04 (*)    HCT 35.0 (*)    Hemoglobin 11.9 (*)     All other components within normal limits  TROPONIN I (HIGH SENSITIVITY) - Abnormal; Notable for the following components:   Troponin I (High Sensitivity) 637 (*)    All other components within normal limits  RESP PANEL BY RT-PCR (FLU A&B, COVID) ARPGX2  CULTURE, BLOOD (ROUTINE X 2)  CULTURE, BLOOD (ROUTINE X 2)  BRAIN NATRIURETIC PEPTIDE  LACTIC ACID, PLASMA  LACTIC ACID, PLASMA  CBG MONITORING, ED  TROPONIN I (HIGH SENSITIVITY)    EKG EKG Interpretation  Date/Time:  Sunday October 10 2020 21:57:43 EST Ventricular Rate:  110 PR Interval:    QRS Duration: 108 QT Interval:  340 QTC Calculation: 460 R Axis:   86 Text Interpretation: Sinus tachycardia Probable left atrial enlargement Borderline right axis deviation RSR' in V1 or V2, probably normal variant LVH with secondary repolarization abnormality ST depression, consider ischemia, diffuse lds Afib no longer present Confirmed by Sherwood Gambler [phone]) on 10/10/2020 10:07:12 PM   Radiology DG Chest Portable 1 View  Result Date: 10/10/2020 CLINICAL DATA:  Weakness, atrial fibrillation EXAM: PORTABLE CHEST 1 VIEW COMPARISON:  02/02/2015 FINDINGS: Bilateral perihilar airspace disease. Mild cardiomegaly. No effusions. No acute bony abnormality. IMPRESSION: Bilateral perihilar opacities, likely edema/CHF. Electronically Signed   By: Rolm Baptise M.D.   On: 10/10/2020 21:53    Procedures .Critical Care Performed by: Sherwood Gambler, MD Authorized by: Sherwood Gambler, MD   Critical care provider statement:    Critical care time (minutes):  40   Critical care was time spent personally by me on the following activities:  Discussions with consultants, evaluation of patient's  response to treatment, examination of patient, ordering and performing treatments and interventions, ordering and review of laboratory studies, ordering and review of radiographic studies, pulse oximetry, re-evaluation of patient's condition, obtaining history  from patient or surrogate and review of old charts   (including critical care time)  Medications Ordered in ED Medications  cefTRIAXone (ROCEPHIN) 2 g in sodium chloride 0.9 % 100 mL IVPB (0 g Intravenous Stopped 10/10/20 2346)  azithromycin (ZITHROMAX) 500 mg in sodium chloride 0.9 % 250 mL IVPB (500 mg Intravenous New Bag/Given 10/10/20 2346)  0.9 %  sodium chloride infusion (250 mLs Intravenous New Bag/Given 10/10/20 2315)  norepinephrine (LEVOPHED) 4mg  in 229mL premix infusion (has no administration in time range)  0.9 %  sodium chloride infusion (has no administration in time range)  sodium chloride 0.9 % bolus 1,000 mL (0 mLs Intravenous Stopped 10/10/20 2319)    ED Course  I have reviewed the triage vital signs and the nursing notes.  Pertinent labs & imaging results that were available during my care of the patient were reviewed by me and considered in my medical decision making (see chart for details).    MDM Rules/Calculators/A&P                          Patient presents with acute weakness and near syncope. Found to have A. fib with RVR of unclear onset. However with some fluids by EMS (1 L) his blood pressure came up and his A. fib seemed to resolve. He is feeling better. Was given second liter. Since then, blood pressure seems to be dropping as well as his oxygen. Chest x-ray looks like it is having pulmonary edema but he doesn't have a CHF history or clinically look like CHF. Given cough at home we'll also treat as sepsis with Rocephin and azithromycin. With the 2 L of IV fluids he has received 30 cc/KG. Blood pressure is now borderline though his mental state is good. Peripheral Levophed has been ordered if his blood pressure were to drop more as I'm not sure he needs more fluids. His troponin elevation is likely secondary to his overall illness rather than primary MI, especially with no chest pain. Discussed with Dr. Patsey Berthold of ICU who asked for respiratory to place arterial  line and ICU will admit. Final Clinical Impression(s) / ED Diagnoses Final diagnoses:  Acute respiratory failure with hypoxia [facility])    Rx / DC Orders ED Discharge Orders    None       Sherwood Gambler, MD 10/10/20 2351

## 2020-10-10 NOTE — ED Notes (Signed)
Date and time results received: 10/10/20 2314 (use smartphrase ".now" to insert current time)  Test: Trop Critical Value: 637  Name of Provider Notified: [place]  Orders Received? Or Actions Taken?: Continue to monitor pt

## 2020-10-11 ENCOUNTER — Inpatient Hospital Stay (HOSPITAL_COMMUNITY): Payer: Medicare Other

## 2020-10-11 ENCOUNTER — Encounter (HOSPITAL_COMMUNITY): Payer: Self-pay | Admitting: Pulmonary Disease

## 2020-10-11 DIAGNOSIS — J189 Pneumonia, unspecified organism: Secondary | ICD-10-CM | POA: Diagnosis present

## 2020-10-11 DIAGNOSIS — E876 Hypokalemia: Secondary | ICD-10-CM | POA: Diagnosis present

## 2020-10-11 DIAGNOSIS — E785 Hyperlipidemia, unspecified: Secondary | ICD-10-CM | POA: Diagnosis present

## 2020-10-11 DIAGNOSIS — R579 Shock, unspecified: Secondary | ICD-10-CM

## 2020-10-11 DIAGNOSIS — I4891 Unspecified atrial fibrillation: Secondary | ICD-10-CM | POA: Diagnosis not present

## 2020-10-11 DIAGNOSIS — J440 Chronic obstructive pulmonary disease with acute lower respiratory infection: Secondary | ICD-10-CM | POA: Diagnosis present

## 2020-10-11 DIAGNOSIS — I110 Hypertensive heart disease with heart failure: Secondary | ICD-10-CM | POA: Diagnosis present

## 2020-10-11 DIAGNOSIS — I255 Ischemic cardiomyopathy: Secondary | ICD-10-CM | POA: Diagnosis present

## 2020-10-11 DIAGNOSIS — F17210 Nicotine dependence, cigarettes, uncomplicated: Secondary | ICD-10-CM | POA: Diagnosis present

## 2020-10-11 DIAGNOSIS — I5043 Acute on chronic combined systolic (congestive) and diastolic (congestive) heart failure: Secondary | ICD-10-CM | POA: Diagnosis present

## 2020-10-11 DIAGNOSIS — Z7982 Long term (current) use of aspirin: Secondary | ICD-10-CM | POA: Diagnosis not present

## 2020-10-11 DIAGNOSIS — R570 Cardiogenic shock: Secondary | ICD-10-CM | POA: Diagnosis present

## 2020-10-11 DIAGNOSIS — I5031 Acute diastolic (congestive) heart failure: Secondary | ICD-10-CM

## 2020-10-11 DIAGNOSIS — I480 Paroxysmal atrial fibrillation: Secondary | ICD-10-CM | POA: Diagnosis present

## 2020-10-11 DIAGNOSIS — I25110 Atherosclerotic heart disease of native coronary artery with unstable angina pectoris: Secondary | ICD-10-CM | POA: Diagnosis not present

## 2020-10-11 DIAGNOSIS — R778 Other specified abnormalities of plasma proteins: Secondary | ICD-10-CM | POA: Diagnosis not present

## 2020-10-11 DIAGNOSIS — N179 Acute kidney failure, unspecified: Secondary | ICD-10-CM | POA: Diagnosis not present

## 2020-10-11 DIAGNOSIS — J9601 Acute respiratory failure with hypoxia: Secondary | ICD-10-CM

## 2020-10-11 DIAGNOSIS — I739 Peripheral vascular disease, unspecified: Secondary | ICD-10-CM | POA: Diagnosis present

## 2020-10-11 DIAGNOSIS — Z79899 Other long term (current) drug therapy: Secondary | ICD-10-CM | POA: Diagnosis not present

## 2020-10-11 DIAGNOSIS — I5041 Acute combined systolic (congestive) and diastolic (congestive) heart failure: Secondary | ICD-10-CM | POA: Diagnosis not present

## 2020-10-11 DIAGNOSIS — I2510 Atherosclerotic heart disease of native coronary artery without angina pectoris: Secondary | ICD-10-CM | POA: Diagnosis present

## 2020-10-11 DIAGNOSIS — Z20822 Contact with and (suspected) exposure to covid-19: Secondary | ICD-10-CM | POA: Diagnosis present

## 2020-10-11 DIAGNOSIS — I214 Non-ST elevation (NSTEMI) myocardial infarction: Secondary | ICD-10-CM | POA: Diagnosis present

## 2020-10-11 DIAGNOSIS — Z8673 Personal history of transient ischemic attack (TIA), and cerebral infarction without residual deficits: Secondary | ICD-10-CM | POA: Diagnosis not present

## 2020-10-11 DIAGNOSIS — Z96643 Presence of artificial hip joint, bilateral: Secondary | ICD-10-CM | POA: Diagnosis present

## 2020-10-11 DIAGNOSIS — I358 Other nonrheumatic aortic valve disorders: Secondary | ICD-10-CM | POA: Diagnosis present

## 2020-10-11 LAB — TROPONIN I (HIGH SENSITIVITY)
Troponin I (High Sensitivity): 5019 ng/L (ref <18)
Troponin I (High Sensitivity): 25844 ng/L (ref <18)
Troponin I (High Sensitivity): 22131 ng/L (ref <18)

## 2020-10-11 LAB — BASIC METABOLIC PANEL
Anion gap: 13 (ref 5–15)
BUN: 16 mg/dL (ref 8–23)
CO2: 19 mmol/L — ABNORMAL LOW (ref 22–32)
Calcium: 8.3 mg/dL — ABNORMAL LOW (ref 8.9–10.3)
Chloride: 105 mmol/L (ref 98–111)
Creatinine, Ser: 1.24 mg/dL (ref 0.61–1.24)
GFR, Estimated: >60 mL/min (ref >60)
Glucose, Bld: 132 mg/dL — ABNORMAL HIGH (ref 70–99)
Potassium: 4.6 mmol/L (ref 3.5–5.1)
Sodium: 137 mmol/L (ref 135–145)

## 2020-10-11 LAB — HIV ANTIBODY (ROUTINE TESTING W REFLEX): HIV Screen 4th Generation wRfx: NONREACTIVE

## 2020-10-11 LAB — CBC
HCT: 39 % (ref 39.0–52.0)
HCT: 38.2 % — ABNORMAL LOW (ref 39.0–52.0)
Hemoglobin: 12.4 g/dL — ABNORMAL LOW (ref 13.0–17.0)
Hemoglobin: 12.3 g/dL — ABNORMAL LOW (ref 13.0–17.0)
MCH: 27.7 pg (ref 26.0–34.0)
MCH: 27.7 pg (ref 26.0–34.0)
MCHC: 31.8 g/dL (ref 30.0–36.0)
MCHC: 32.2 g/dL (ref 30.0–36.0)
MCV: 87.2 fL (ref 80.0–100.0)
MCV: 86 fL (ref 80.0–100.0)
Platelets: 272 10*3/uL (ref 150–400)
Platelets: 313 10*3/uL (ref 150–400)
RBC: 4.47 MIL/uL (ref 4.22–5.81)
RBC: 4.44 MIL/uL (ref 4.22–5.81)
RDW: 14.8 % (ref 11.5–15.5)
RDW: 14.6 % (ref 11.5–15.5)
WBC: 16.5 10*3/uL — ABNORMAL HIGH (ref 4.0–10.5)
WBC: 16 10*3/uL — ABNORMAL HIGH (ref 4.0–10.5)
nRBC: 0 % (ref 0.0–0.2)
nRBC: 0 % (ref 0.0–0.2)

## 2020-10-11 LAB — COOXEMETRY PANEL
Carboxyhemoglobin: 0.4 % — ABNORMAL LOW (ref 0.5–1.5)
Carboxyhemoglobin: 0.6 % (ref 0.5–1.5)
Methemoglobin: 1.3 % (ref 0.0–1.5)
Methemoglobin: 1.4 % (ref 0.0–1.5)
O2 Saturation: 77.8 %
O2 Saturation: 69.1 %
Total hemoglobin: 12.4 g/dL (ref 12.0–16.0)
Total hemoglobin: 10.8 g/dL — ABNORMAL LOW (ref 12.0–16.0)

## 2020-10-11 LAB — PROCALCITONIN: Procalcitonin: 0.58 ng/mL

## 2020-10-11 LAB — LACTIC ACID, PLASMA
Lactic Acid, Venous: 1.3 mmol/L (ref 0.5–1.9)
Lactic Acid, Venous: 1.3 mmol/L (ref 0.5–1.9)
Lactic Acid, Venous: 1.1 mmol/L (ref 0.5–1.9)

## 2020-10-11 LAB — CREATININE, SERUM
Creatinine, Ser: 1.27 mg/dL — ABNORMAL HIGH (ref 0.61–1.24)
GFR, Estimated: >60 mL/min (ref >60)

## 2020-10-11 LAB — HEPARIN LEVEL (UNFRACTIONATED)
Heparin Unfractionated: <0.1 IU/mL — ABNORMAL LOW (ref 0.30–0.70)
Heparin Unfractionated: <0.1 IU/mL — ABNORMAL LOW (ref 0.30–0.70)

## 2020-10-11 LAB — MAGNESIUM: Magnesium: 1.9 mg/dL (ref 1.7–2.4)

## 2020-10-11 LAB — CORTISOL: Cortisol, Plasma: 28.1 ug/dL

## 2020-10-11 LAB — ECHOCARDIOGRAM COMPLETE
Area-P 1/2: 3.48 cm2
S' Lateral: 4.2 cm
Weight: 2398.6 oz

## 2020-10-11 LAB — MRSA PCR SCREENING: MRSA by PCR: NEGATIVE

## 2020-10-11 LAB — PHOSPHORUS: Phosphorus: 3.8 mg/dL (ref 2.5–4.6)

## 2020-10-11 LAB — PROTIME-INR
INR: 1 (ref 0.8–1.2)
Prothrombin Time: 12.9 seconds (ref 11.4–15.2)

## 2020-10-11 MED ORDER — CHLORHEXIDINE GLUCONATE CLOTH 2 % EX PADS
6.0000 | MEDICATED_PAD | Freq: Every day | CUTANEOUS | Status: DC
  Administered 2020-10-12 – 2020-10-14 (×3): 6 via TOPICAL

## 2020-10-11 MED ORDER — SODIUM CHLORIDE 0.9 % IV SOLN: INTRAVENOUS | Status: DC | PRN

## 2020-10-11 MED ORDER — ASPIRIN EC 81 MG PO TBEC
81.0000 mg | DELAYED_RELEASE_TABLET | Freq: Every day | ORAL | Status: DC
Start: 2020-10-11 — End: 2020-10-14
  Administered 2020-10-11 – 2020-10-14 (×4): 81 mg via ORAL
  Filled 2020-10-11 (×4): qty 1

## 2020-10-11 MED ORDER — FUROSEMIDE 10 MG/ML IJ SOLN
40.0000 mg | Freq: Once | INTRAMUSCULAR | Status: AC
  Administered 2020-10-11: 05:00:00 40 mg via INTRAVENOUS
  Filled 2020-10-11: qty 4

## 2020-10-11 MED ORDER — ASPIRIN 81 MG PO CHEW
81.0000 mg | CHEWABLE_TABLET | ORAL | Status: AC
  Administered 2020-10-12: 06:00:00 81 mg via ORAL
  Filled 2020-10-11: qty 1

## 2020-10-11 MED ORDER — HEPARIN (PORCINE) 25000 UT/250ML-% IV SOLN
1150.0000 [IU]/h | INTRAVENOUS | Status: DC
  Administered 2020-10-11: 13:00:00 950 [IU]/h via INTRAVENOUS
  Administered 2020-10-11: 05:00:00 750 [IU]/h via INTRAVENOUS
  Filled 2020-10-11 (×2): qty 250

## 2020-10-11 MED ORDER — SODIUM CHLORIDE 0.9% FLUSH
3.0000 mL | Freq: Two times a day (BID) | INTRAVENOUS | Status: DC
  Administered 2020-10-11 – 2020-10-13 (×5): 3 mL via INTRAVENOUS

## 2020-10-11 MED ORDER — SODIUM CHLORIDE 0.9% FLUSH: 3.0000 mL | INTRAVENOUS | Status: DC | PRN

## 2020-10-11 MED ORDER — HEPARIN BOLUS VIA INFUSION
1500.0000 [IU] | Freq: Once | INTRAVENOUS | Status: AC
  Administered 2020-10-11: 20:00:00 1500 [IU] via INTRAVENOUS
  Filled 2020-10-11: qty 1500

## 2020-10-11 MED ORDER — AMIODARONE HCL 200 MG PO TABS
400.0000 mg | ORAL_TABLET | Freq: Two times a day (BID) | ORAL | Status: DC
  Administered 2020-10-11 – 2020-10-14 (×6): 400 mg via ORAL
  Filled 2020-10-11 (×6): qty 2

## 2020-10-11 MED ORDER — SODIUM CHLORIDE 0.9 % IV SOLN: 250.0000 mL | INTRAVENOUS | Status: DC | PRN

## 2020-10-11 MED ORDER — HEPARIN SODIUM (PORCINE) 5000 UNIT/ML IJ SOLN: 5000.0000 [IU] | Freq: Three times a day (TID) | INTRAMUSCULAR | Status: DC

## 2020-10-11 MED ORDER — SODIUM CHLORIDE 0.9 % IV SOLN: INTRAVENOUS | Status: DC

## 2020-10-11 MED ORDER — HEPARIN BOLUS VIA INFUSION
3500.0000 [IU] | Freq: Once | INTRAVENOUS | Status: AC
  Administered 2020-10-11: 05:00:00 3500 [IU] via INTRAVENOUS
  Filled 2020-10-11: qty 3500

## 2020-10-11 NOTE — H&P (Signed)
Please see consult note dictated by Georgann Housekeeper, supervised by Dr. Marchelle Gearing on 12/13

## 2020-10-11 NOTE — Progress Notes (Signed)
ANTICOAGULATION CONSULT NOTE - Initial Consult  Pharmacy Consult for Heparin Indication: chest pain/ACS  No Known Allergies  Patient Measurements:    Ht 68 in Wt 61 kg   Vital Signs: Temp: 98 F (36.7 C) (12/13 0000) Temp Source: Oral (12/13 0000) BP: 95/79 (12/13 0358) Pulse Rate: 82 (12/13 0320)  Labs: Recent Labs    10/10/20 2210 10/10/20 2217 10/10/20 2354 10/11/20 0140 10/11/20 0207  HGB 12.6* 11.9* 11.9*  --  12.4*  HCT 39.7 35.0* 35.0*  --  39.0  PLT 240  --   --   --  272  LABPROT  --   --   --   --  12.9  INR  --   --   --   --  1.0  CREATININE 1.34*  --   --   --  1.27*  TROPONINIHS 637*  --   --  5,019*  --     CrCl cannot be calculated (Unknown ideal weight.).   Medical History: Past Medical History:  Diagnosis Date  . Carotid stenosis    [place] 7543:  R 60-67; LICA occluded; prox L subcl occluded; R subcl > 50%  . COPD (chronic obstructive pulmonary disease) ([place])   . History of stroke    Echo 7/16:  EF 60-65  . Hyperlipidemia   . Pituitary tumor    s/p excision ~ 2000  . Stenosis of right subclavian artery ([place])    S/P STENT FOLLOWED BY DR. Leonie Man AND DR. [name]   . TIA (transient ischemic attack)     Medications:  See electronic med rec  Assessment: 59 y.o. M presents with new onset AF with RVR which has improved. Pharmacy to start heparin for r/o ACS. Trop up to 5019. CBC ok on admission. No AC PTA.  Goal of Therapy:  Heparin level 0.3-0.7 units/ml Monitor platelets by anticoagulation protocol: Yes   Plan:  Heparin IV bolus 3500 units Heparin gtt at 750 units/hr Will f/u heparin level in 6 hours Daily heparin level and CBC  Sherlon Handing, PharmD, BCPS Please see amion for complete clinical pharmacist phone list 10/11/2020,4:05 AM

## 2020-10-11 NOTE — Procedures (Signed)
Arterial Catheter Insertion Procedure Note  KELBY ADELL  [id number]  08-21-1961  Date:10/11/20  Time:11:24 AM    Provider Performing: Montey Hora    Procedure: Insertion of Arterial Line [phone]) without US guidance  Indication(s) Blood pressure monitoring and/or need for frequent ABGs  Consent Risks of the procedure as well as the alternatives and risks of each were explained to the patient and/or caregiver.  Consent for the procedure was obtained and is signed in the bedside chart  Anesthesia None   Time Out Verified patient identification, verified procedure, site/side was marked, verified correct patient position, special equipment/implants available, medications/allergies/relevant history reviewed, required imaging and test results available.   Sterile Technique Maximal sterile technique including full sterile barrier drape, hand hygiene, sterile gown, sterile gloves, mask, hair covering, sterile ultrasound probe cover (if used).   Procedure Description Area of catheter insertion was cleaned with chlorhexidine and draped in sterile fashion. Without real-time ultrasound guidance an arterial catheter was placed into the left radial artery.  Appropriate arterial tracings confirmed on monitor.     Complications/Tolerance None; patient tolerated the procedure well.   EBL Minimal   Specimen(s) None   Montey Hora, [place] [facility] 10/11/2020, 11:25 AM

## 2020-10-11 NOTE — Plan of Care (Signed)

## 2020-10-11 NOTE — Procedures (Signed)
Arterial Catheter Insertion Procedure Note  Raymond Carney  [id number]  11-21-1960  Date:10/11/20  Time:12:44 AM    Provider Performing: Ulice Dash    Procedure: Insertion of Arterial Line [phone]) with US guidance ([id number])   Indication(s) Blood pressure monitoring and/or need for frequent ABGs  Consent Unable to obtain consent due to emergent nature of procedure.  Anesthesia None   Time Out Verified patient identification, verified procedure, site/side was marked, verified correct patient position, special equipment/implants available, medications/allergies/relevant history reviewed, required imaging and test results available.   Sterile Technique Maximal sterile technique including full sterile barrier drape, hand hygiene, sterile gown, sterile gloves, mask, hair covering, sterile ultrasound probe cover (if used).   Procedure Description Area of catheter insertion was cleaned with chlorhexidine and draped in sterile fashion. With real-time ultrasound guidance an arterial catheter was placed into the right radial artery.  Appropriate arterial tracings confirmed on monitor.     Complications/Tolerance None; patient tolerated the procedure well.   EBL Minimal   Specimen(s) None

## 2020-10-11 NOTE — ED Notes (Signed)
Pt admitted to 2H04; report called to Baxter Flattery, Therapist, sports.

## 2020-10-11 NOTE — Progress Notes (Signed)
NAME:  Raymond Carney, MRN:  [id number], DOB:  18-Oct-1961, LOS: 0 ADMISSION DATE:  10/10/2020, CONSULTATION DATE:  12/13 REFERRING MD:  Dr. Regenia Skeeter, CHIEF COMPLAINT:  Shock   Brief History   59 year old male presented in new onset AF-RVR. Improved with volume, but he then became hypoxic and hypotensive requiring BiPAP and levophed. PCCM consulted.   History of present illness   59 year old male with PMH as below, which is significant for 40 pack year history active smoker, multivessel CAD, R subclavian artery stenosis, and GI bleed. He had admission about one year prior to this admission for hemorrhagic shock secondary to bleeding gastric ulcers. He suffered type-II MI as a result of anemia and underwent cardiac workup showing multi-vessel CAD. He was not felt to be a surgical candidate at that time due to GI bleeding. Outpatient follow-up has been uneventful.   He now presented to [facility] ED on 12/12 with complaints of weakness and malaise. Upon arrival to ED he was found to be in rapid AF with rates in the 120s. This improved with 2L IVF as part of sepsis workup, however, shortly after he became dyspneic, hypoxic, and hypotensive. He was started on BiPAP and has since been started on norepinephrine with improvement. Denies fever, chills, chest pain, productive cough.   Past Medical History   has a past medical history of Carotid stenosis, COPD (chronic obstructive pulmonary disease) ([place]), History of stroke, Hyperlipidemia, Pituitary tumor, Stenosis of right subclavian artery ([place]), and TIA (transient ischemic attack).   Significant Hospital Events   12/12 admit  Consults:  Cardiology  Procedures:  CVL pending 12/13 >  R radial art line 12/12 > 12/13. New radial art line pending 12/13 >   Significant Diagnostic Tests:  POC echo 12/13 > EF 30% Echocardiogram 12/13 >  Micro Data:  COVID 12/12 > neg. Flu 12/12 > neg. Blood 12/13 >   Antimicrobials:  CTX 12/12 > Azithromycin  12/12 >  Interim history/subjective:  Remains on BiPAP. Levo at 7, gradually increased overnight.   Objective   Blood pressure 93/74, pulse 73, temperature (!) 97.5 F (36.4 C), temperature source Axillary, resp. rate 14, weight 68 kg, SpO2 100 %.    Vent Mode: BIPAP FiO2 (%):  [80 %-100 %] 80 % PEEP:  [8 cmH20] 8 cmH20 Pressure Support:  [10 cmH20] 10 cmH20   Intake/Output Summary (Last 24 hours) at 10/11/2020 0842 Last data filed at 10/11/2020 0700 Gross per 24 hour  Intake 1671.13 ml  Output --  Net 1671.13 ml   Filed Weights   10/11/20 0500  Weight: 68 kg    Examination:  General: Adult male, resting in bed, in NAD. Neuro: A&O x 3, no deficits. HEENT: /AT. Sclerae anicteric. EOMI. BiPAP in place Cardiovascular: RRR, no M/R/G.  Lungs: Respirations even and unlabored.  CTA bilaterally, No W/R/R.  Abdomen: BS x 4, soft, NT/ND.  Musculoskeletal: No gross deformities, no edema.  Skin: Intact, cool, no rashes.   Assessment & Plan:   Acute hypoxemic respiratory failure: likely due to pulmonary edema, but cannot rule out CAP with bilateral infiltrates and leukocytosis.  - Continue BiPAP as needed, will give him a break now for 1 - 2 hours and see how he does. - SpO2 goal > 90%. - Empiric CAP coverage.  - Blood cultures pending.   Shock: septic vs cardiogenic. Favor the latter.  - Replace right arterial line since he has hx of R [place] artery stenosis and  line has poor readings. - Will place either left art line or femoral - Place CVL - Continue levophed as needed for goal MAP > 65.  Acute on chronic HFpEF Known multivessel CAD. Troponin elevation: likely related to demand.  - Unable to diurese due to shock.  - Continue empiric heparin. - F/u on Echocardiogram. - Cardiology consultation.  COPD without acute exacerbation (Active 1 ppd smoker). - PRN albuterol  Best practice (evaluated daily)  Diet: NPO Pain/Anxiety/Delirium protocol (if indicated): NA VAP  protocol (if indicated): NA DVT prophylaxis: heparin infusion GI prophylaxis: NA Glucose control: NA Mobility: BR last date of multidisciplinary goals of care discussion Family and staff present  Summary of discussion  Follow up goals of care discussion due 12/20 Code Status: FULL Disposition: ICU   Critical care time: 30 minutes   Montey Hora, [place] [facility] 10/11/2020, 9:00 AM

## 2020-10-11 NOTE — Procedures (Signed)
Point of Care Echocardiogram:  LV systolic function is reduced, EF approximately 30% Global hypokinesis with severe hypokinesis/akinesis of the LV apex Limited views of RV, probably no significant RV dysfunction Aortic valve opens well, no significant regurgitation No significant mitral regurgitation RVSP is approximately 55 mmHg IVC 2.5 cm with 50% inspiratory collapse No significant pericardial effusion  Compared to prior echocardiogram the LV systolic function is now reduced and estimated RVSP and RAP have increased.

## 2020-10-11 NOTE — Procedures (Signed)
Central Venous Catheter Insertion Procedure Note  JOSSUE RUBENSTEIN  [id number]  [birth year]  Date:10/11/20  Time:11:23 AM   Provider Performing:Aayushi Solorzano Shearon Stalls   Procedure: Insertion of Non-tunneled Central Venous Catheter(36556) without US guidance  Indication(s) Medication administration and Difficult access  Consent Risks of the procedure as well as the alternatives and risks of each were explained to the patient and/or caregiver.  Consent for the procedure was obtained and is signed in the bedside chart  Anesthesia Topical only with 1% lidocaine   Timeout Verified patient identification, verified procedure, site/side was marked, verified correct patient position, special equipment/implants available, medications/allergies/relevant history reviewed, required imaging and test results available.  Sterile Technique Maximal sterile technique including full sterile barrier drape, hand hygiene, sterile gown, sterile gloves, mask, hair covering, sterile ultrasound probe cover (if used).  Procedure Description Area of catheter insertion was cleaned with chlorhexidine and draped in sterile fashion.  Without real-time ultrasound guidance a central venous catheter was placed into the left internal jugular vein. Nonpulsatile blood flow and easy flushing noted in all ports.  The catheter was sutured in place and sterile dressing applied.  Complications/Tolerance None; patient tolerated the procedure well. Chest X-ray is ordered to verify placement for internal jugular or subclavian cannulation.   Chest x-ray is not ordered for femoral cannulation.  EBL Minimal  Specimen(s) None  Attempted left [place] cannulation on first attempt.  Vessel entered but met resistance with wire and pt had discomfort; therefore, aborted and switched to IJ approach.   Montey Hora, [place] [facility] 10/11/2020, 11:24 AM

## 2020-10-11 NOTE — Progress Notes (Signed)
ANTICOAGULATION CONSULT NOTE - Initial Consult  Pharmacy Consult for Heparin Indication: chest pain/ACS  No Known Allergies  Patient Measurements: Weight: 68 kg (149 lb 14.6 oz)  Ht 68 in Wt 61 kg   Vital Signs: Temp: 98.7 F (37.1 C) (12/13 1151) Temp Source: Oral (12/13 1151) BP: 93/74 (12/13 0700) Pulse Rate: 73 (12/13 0700)  Labs: Recent Labs    10/10/20 2210 10/10/20 2217 10/10/20 2354 10/11/20 0140 10/11/20 0207 10/11/20 0500 10/11/20 1120  HGB 12.6*   < > 11.9*  --  12.4* 12.3*  --   HCT 39.7   < > 35.0*  --  39.0 38.2*  --   PLT 240  --   --   --  272 313  --   LABPROT  --   --   --   --  12.9  --   --   INR  --   --   --   --  1.0  --   --   HEPARINUNFRC  --   --   --   --   --   --  <0.10*  CREATININE 1.34*  --   --   --  1.27* 1.24  --   TROPONINIHS 637*  --   --  5,019*  --   --  81,275*   < > = values in this interval not displayed.    CrCl cannot be calculated (Unknown ideal weight.).   Medical History: Past Medical History:  Diagnosis Date  . Carotid stenosis    [place] 1700:  R 17-49; LICA occluded; prox L subcl occluded; R subcl > 50%  . COPD (chronic obstructive pulmonary disease) ([place])   . History of stroke    Echo 7/16:  EF 60-65  . Hyperlipidemia   . Pituitary tumor    s/p excision ~ 2000  . Stenosis of right subclavian artery ([place])    S/P STENT FOLLOWED BY DR. Leonie Man AND DR. [name]   . TIA (transient ischemic attack)     Medications:  See electronic med rec  Assessment: 59 y.o. M presents with new onset AF with RVR which has improved. Pharmacy to start heparin for r/o ACS. Trop up to 44,967. No AC PTA.   Hgb 12.3, plt 313. Heparin infusion was stopped for central line placement (~30 minutes prior to level being drawn). Anticipate level still low despite holding. No infusion issues or s/sx of bleeding.  Goal of Therapy:  Heparin level 0.3-0.7 units/ml Monitor platelets by anticoagulation protocol: Yes   Plan:  Increase heparin  gtt at 950 units/hr Will f/u heparin level in 6 hours Daily heparin level and CBC  Antonietta Jewel, PharmD, [place] Pharmacist  Phone: [phone] 10/11/2020 12:52 PM  Please check AMION for all [place] phone numbers After 10:00 PM, call [place] [phone]

## 2020-10-11 NOTE — Progress Notes (Signed)
Pt transported from ED 25 to [facility] 04 without complications.

## 2020-10-11 NOTE — Progress Notes (Signed)
  Echocardiogram 2D Echocardiogram has been performed.  Raymond Carney 10/11/2020, 9:36 AM

## 2020-10-11 NOTE — Consult Note (Addendum)
Cardiology Consultation:   Patient ID: Raymond Carney MRN: [id number]; DOB: 1961-02-23  Admit date: 10/10/2020 Date of Consult: 10/11/2020  Primary Care Provider: Antony Contras, MD [facility] Cardiologist: Raymond Furbish, MD  [facility] Electrophysiologist:  None    Patient Profile:   Raymond Carney is a 59 y.o. male with a hx of evere PAD, multivessel obstructive CAD, GI bleed, right subclavian stenosis, tobacco use who is being seen today for the evaluation of shock at the request of Dr Raymond Carney.  History of Present Illness:   Raymond Carney with history of severe PAD, multivessel obstructive CAD, GI bleed, right subclavian stenosis, tobacco use who presents with shock.  He was admitted in November 2020 with hemorrhagic shock due to bleeding gastric ulcers.  Had troponin elevation, with echocardiogram on 09/28/2019 showing LVEF 50 to 40%, grade 2 diastolic dysfunction, normal RV function, mild MR. Underwent catheterization on 09/30/2019, which showed 85% proximal to mid LAD stenosis, mid to distal eccentric 85% LAD stenosis, occluded ostial circumflex, severe diffuse proximal to distal RCA stenosis with eccentric 75% mid stenosis.  Was not felt to be appropriate for revascularization at that time given recent GI bleed.  He presented to [facility] yesterday with weakness.  Noted to be in A. fib with RVR, rates 120s, on admission.  He was given 2 L IV fluid, and subsequently became short of breath, hypoxic, and hypotensive.  He was started on BiPAP, Levophed and admitted to CCU.  On presentation to ED, pulse up to 129, BP 80/62 initially, fell as low as 57/33.  Labs notable for creatinine 1.3, BNP 848, high-sensitivity troponin I 637 > 5019, lactate 3.4 > 1.3, procalcitonin 0.58, WBC 19, hemoglobin 12.6, platelets 240.  Bedside echo overnight reportedly with EF 30%.  Currently in sinus rhythm in 70s.  He denies any chest pain.  Reports dyspnea has improved.   Past Medical History:  Diagnosis Date   . Carotid stenosis    [place] 9811:  R 91-47; LICA occluded; prox L subcl occluded; R subcl > 50%  . COPD (chronic obstructive pulmonary disease) ([place])   . History of stroke    Echo 7/16:  EF 60-65  . Hyperlipidemia   . Pituitary tumor    s/p excision ~ 2000  . Stenosis of right subclavian artery ([place])    S/P STENT FOLLOWED BY DR. Leonie Man AND DR. [name]   . TIA (transient ischemic attack)     Past Surgical History:  Procedure Laterality Date  . bilateral hip replacements    . ESOPHAGOGASTRODUODENOSCOPY (EGD) WITH PROPOFOL N/A 09/29/2019   Procedure: ESOPHAGOGASTRODUODENOSCOPY (EGD) WITH PROPOFOL;  Surgeon: Yetta Flock, MD;  Location: [facility];  Service: Gastroenterology;  Laterality: N/A;  . ESOPHAGOGASTRODUODENOSCOPY (EGD) WITH PROPOFOL N/A 02/18/2020   Procedure: ESOPHAGOGASTRODUODENOSCOPY (EGD) WITH PROPOFOL;  Surgeon: Arta Silence, MD;  Location: [facility];  Service: Endoscopy;  Laterality: N/A;  . HIP ARTHROPLASTY Bilateral    ON DISABILITY SECONDARY   . IR ANGIO INTRA EXTRACRAN SEL COM CAROTID INNOMINATE UNI R MOD SED  07/03/2019  . IR ANGIOGRAM EXTREMITY RIGHT  07/03/2019  . LEFT HEART CATH AND CORONARY ANGIOGRAPHY N/A 09/30/2019   Procedure: LEFT HEART CATH AND CORONARY ANGIOGRAPHY;  Surgeon: Belva Crome, MD;  Location: [facility];  Service: Cardiovascular;  Laterality: N/A;  . OTHER SURGICAL HISTORY  1996   HISTORY OF PITUITARY TUMOR REMOVAL CIRCA  . RADIOLOGY WITH ANESTHESIA N/A 05/26/2015   Procedure: RADIOLOGY WITH ANESTHESIA/ANGIOPLASTY;  Surgeon: Luanne Bras, MD;  Location: [facility];  Service: Radiology;  Laterality: N/A;  . TRANSPHENOIDAL / TRANSNASAL HYPOPHYSECTOMY / RESECTION PITUITARY TUMOR       Inpatient Medications: Scheduled Meds:  Continuous Infusions: . sodium chloride 10 mL/hr at 10/11/20 0700  . sodium chloride    . sodium chloride    . azithromycin Stopped (10/11/20 0050)  . cefTRIAXone (ROCEPHIN)  IV Stopped (10/10/20 2342)   . heparin 750 Units/hr (10/11/20 0700)  . norepinephrine (LEVOPHED) Adult infusion 7 mcg/min (10/11/20 0738)   PRN Meds: Place/Maintain arterial line **AND** sodium chloride, Place/Maintain arterial line **AND** sodium chloride  Allergies:   No Known Allergies  Social History:   Social History   Socioeconomic History  . Marital status: Divorced    Spouse name: Not on file  . Number of children: 1  . Years of education: Not on file  . Highest education level: Not on file  Occupational History  . Occupation: Disabled  Tobacco Use  . Smoking status: Current Every Day Smoker    Packs/day: 1.27    Years: 40.00    Pack years: 50.80    Types: Cigarettes    Last attempt to quit: 01/22/2014    Years since quitting: 6.7  . Smokeless tobacco: Never Used  Vaping Use  . Vaping Use: Never used  Substance and Sexual Activity  . Alcohol use: No  . Drug use: Never  . Sexual activity: Not on file  Other Topics Concern  . Not on file  Social History Narrative  . Not on file   Social Determinants of Health   Financial Resource Strain: Not on file  Food Insecurity: Not on file  Transportation Needs: Not on file  Physical Activity: Not on file  Stress: Not on file  Social Connections: Not on file  Intimate Partner Violence: Not on file    Family History:    Family History  Problem Relation Age of Onset  . Hypertension Mother   . CAD Mother   . Congestive Heart Failure Mother   . Atrial fibrillation Mother   . Stroke Father   . Hypertension Father   . CVA Father   . CAD Father        s/p CABG  . Prostate cancer Father   . Colon cancer Sister 46  . Colon polyps Neg Hx   . Liver cancer Neg Hx      ROS:  Please see the history of present illness.   All other ROS reviewed and negative.     Physical Exam/Data:   Vitals:   10/11/20 0600 10/11/20 0700 10/11/20 0731 10/11/20 0743  BP: (!) 108/94 93/74    Pulse: 70 73    Resp: 19 14    Temp:   (!) 97.5 F (36.4 C)    TempSrc:   Axillary   SpO2: 100% 100%  100%  Weight:        Intake/Output Summary (Last 24 hours) at 10/11/2020 0825 Last data filed at 10/11/2020 0700 Gross per 24 hour  Intake 1671.13 ml  Output --  Net 1671.13 ml   Last 3 Weights 10/11/2020 04/02/2020 02/18/2020  Weight (lbs) 149 lb 14.6 oz 134 lb 9.6 oz 150 lb  Weight (kg) 68 kg 61.054 kg 68.04 kg     Body mass index is 22.79 kg/m.  General:  On Bipap, appears comfortable HEENT: normal Neck: no JVD appreciated Cardiac:  RRR, 3/6 systolic murmur at RUSB Lungs: Bibasilar crackles Abd: soft, nontender, no hepatomegaly  Ext: no edema Musculoskeletal:  No deformities, BUE and BLE strength normal and equal Skin: warm and dry  Neuro:   no focal abnormalities noted Psych:  Normal affect   EKG:  The EKG was personally reviewed and demonstrates: Atrial fibrillation, rate 137, LVH with repolarization changes, diffuse ST depressions with aVR elevation Telemetry:  Telemetry was personally reviewed and demonstrates: Normal sinus rhythm with rate 70  Relevant CV Studies:   Laboratory Data:  High Sensitivity Troponin:   Recent Labs  Lab 10/10/20 2210 10/11/20 0140  TROPONINIHS 637* 5,019*     Chemistry Recent Labs  Lab 10/10/20 2210 10/10/20 2217 10/10/20 2354 10/11/20 0207 10/11/20 0500  NA 141 142 140  --  137  K 3.8 3.7 4.2  --  4.6  CL 106  --   --   --  105  CO2 21*  --   --   --  19*  GLUCOSE 104*  --   --   --  132*  BUN 15  --   --   --  16  CREATININE 1.34*  --   --  1.27* 1.24  CALCIUM 8.6*  --   --   --  8.3*  GFRNONAA >60  --   --  >60 >60  ANIONGAP 14  --   --   --  13    Recent Labs  Lab 10/10/20 2210  PROT 6.5  ALBUMIN 3.2*  AST 52*  ALT 25  ALKPHOS 57  BILITOT 0.5   Hematology Recent Labs  Lab 10/10/20 2210 10/10/20 2217 10/10/20 2354 10/11/20 0207 10/11/20 0500  WBC 19.0*  --   --  16.5* 16.0*  RBC 4.49  --   --  4.47 4.44  HGB 12.6*   < > 11.9* 12.4* 12.3*  HCT 39.7   < > 35.0*  39.0 38.2*  MCV 88.4  --   --  87.2 86.0  MCH 28.1  --   --  27.7 27.7  MCHC 31.7  --   --  31.8 32.2  RDW 14.7  --   --  14.8 14.6  PLT 240  --   --  272 313   < > = values in this interval not displayed.   BNP Recent Labs  Lab 10/10/20 2255  BNP 847.8*    DDimer No results for input(s): DDIMER in the last 168 hours.   Radiology/Studies:  DG Chest Port 1 View  Result Date: 10/11/2020 CLINICAL DATA:  Hypoxia EXAM: PORTABLE CHEST 1 VIEW COMPARISON:  12/12/2019 FINDINGS: Bilateral perihilar airspace opacities are again noted similar prior study. Heart is mildly enlarged. No effusions or pneumothorax. IMPRESSION: Stable bilateral perihilar airspace disease. Electronically Signed   By: Rolm Baptise M.D.   On: 10/11/2020 02:26   DG Chest Portable 1 View  Result Date: 10/10/2020 CLINICAL DATA:  Weakness, atrial fibrillation EXAM: PORTABLE CHEST 1 VIEW COMPARISON:  02/02/2015 FINDINGS: Bilateral perihilar airspace disease. Mild cardiomegaly. No effusions. No acute bony abnormality. IMPRESSION: Bilateral perihilar opacities, likely edema/CHF. Electronically Signed   By: Rolm Baptise M.D.   On: 10/10/2020 21:53     Assessment and Plan:   Shock: Cardiogenic versus septic.  Lactate 3.4 on admission, has normalized.  Currently on Levophed at 7 mcg/min.  Given known severe multivessel CAD with reportedly severe LV dysfunction on bedside echo, suspect cardiogenic shock.  However does not appear cold or wet on exam and procalcitonin 0.58, WBC 19 on admission -Echocardiogram -Recommend CVC placement and checking co-ox/CVP.  Good response to  IV Lasix overnight, would follow CVP to guide further diuresis. If low Co-ox, plan to add milrinone in addition to levophed -Arterial line is in right arm, and given his severe right subclavian stenosis, suspect this is inaccurate.  Would recommend left arm arterial line -F/u culture results, abx per primary team  ADDENDUM: Echo showed EF 35% with regional  WMA.  CVC placed, Co-ox 69%, CVP 4.  Can hold off on further diuresis or milrinone.  Troponin up to 25K.  Recommend proceeding with LHC/RHC tomorrow.  Risks and benefits of cardiac catheterization have been discussed with the patient.  These include bleeding, infection, kidney damage, stroke, heart attack, death.  The patient understands these risks and is willing to proceed.  A. fib with RVR: Presented with A. fib with RVR, rates 130s.  Converted to sinus rhythm overnight -Continue heparin drip -Suspect Afib was likely contributing to his shock on presentation, recommend starting amiodarone 400mg  BID to maintain sinus rhythm  Troponin elevation: High-sensitivity troponin 637 > 5019.  He denies any chest pain.  Has known severe multivessel CAD.  May represent demand ischemia in setting of shock as above -Trend troponin to peak -Echocardiogram -Continue heparin drip as above.  Continue ASA, statin -Likely plan RHC/LHC once stabilized.  Revascularization was previously deferred due to GI bleed last year.  Hemoglobin appears stable, will monitor while on heparin for further bleeding.  If no further bleeding or evidence of infection as above, would recommend evaluation for revascularization, particularly if EF is now reduced   CRITICAL CARE TIME: I have spent a total of 40 minutes with patient reviewing hospital notes, telemetry, EKGs, labs and examining the patient as well as establishing an assessment and plan that was discussed with the patient.  > 50% of time was spent in direct patient care. The patient is critically ill with multi-organ system failure and requires high complexity decision making for assessment and support, frequent evaluation and titration of therapies, application of advanced monitoring technologies and extensive interpretation of multiple databases.   For questions or updates, please contact [place] Please consult [email or web address] for contact info under     Signed, Donato Heinz, MD  10/11/2020 8:25 AM

## 2020-10-11 NOTE — Progress Notes (Signed)
Pt being followed for Sepsis protocol 

## 2020-10-11 NOTE — Consult Note (Signed)
NAME:  Raymond Carney, MRN:  [id number], DOB:  Jun 02, 1961, LOS: 0 ADMISSION DATE:  10/10/2020, CONSULTATION DATE:  12/13 REFERRING MD:  Dr. Regenia Skeeter, CHIEF COMPLAINT:  Shock   Brief History   59 year old male presented in new onset AF-RVR. Improved with volume, but he then became hypoxic and hypotensive requiring BiPAP and levophed. PCCM consulted.   History of present illness   59 year old male with PMH as below, which is significant for 40 pack year history active smoker, multivessel CAD, R subclavian artery stenosis, and GI bleed. He had admission about one year prior to this admission for hemorrhagic shock secondary to bleeding gastric ulcers. He suffered type-II MI as a result of anemia and underwent cardiac workup showing multi-vessel CAD. He was not felt to be a surgical candidate at that time due to GI bleeding. Outpatient follow-up has been uneventful.   He now presented to [facility] ED on 12/12 with complaints of weakness and malaise. Upon arrival to ED he was found to be in rapid AF with rates in the 120s. This improved with 2L IVF as part of sepsis workup, however, shortly after he became dyspneic, hypoxic, and hypotensive. He was started on BiPAP and has since been started on norepinephrine with improvement. Denies fever, chills, chest pain, productive cough.   Past Medical History   has a past medical history of Carotid stenosis, COPD (chronic obstructive pulmonary disease) ([place]), History of stroke, Hyperlipidemia, Pituitary tumor, Stenosis of right subclavian artery ([place]), and TIA (transient ischemic attack).   Significant Hospital Events   12/12 admit  Consults:  Cardiology  Procedures:    Significant Diagnostic Tests:  Echocardiogram 12/13 >  Micro Data:    Antimicrobials:  CTX 12/12 > Azithromycin 12/12 >  Interim history/subjective:    Objective   Blood pressure 98/84, pulse (!) 103, temperature (!) 97.3 F (36.3 C), temperature source Oral, resp. rate  (!) 24, SpO2 100 %.    Vent Mode: BIPAP FiO2 (%):  [100 %] 100 %   Intake/Output Summary (Last 24 hours) at 10/11/2020 0135 Last data filed at 10/10/2020 2319 Gross per 24 hour  Intake 1000 ml  Output --  Net 1000 ml   There were no vitals filed for this visit.  Examination:  General: elderly appearing male in NAD on BiPAP HENT: [place]/AT, PERRL, no appreciable JVD Lungs: fine crackles, B lines on lung ultrasound.  Cardiovascular: RRR, no MRG Abdomen: Soft, non-tender, non-distended Extremities: No acute deformity. No edema.  Neuro: Alert, oriented, non-focal  Resolved Hospital Problem list     Assessment & Plan:   Acute hypoxemic respiratory failure: likely due to pulmonary edema, but cannot rule out CAP with bilateral infiltrates and leukocytosis.  - BiPAP - SpO2 goal > 90% - Empiric CAP coverage.  - Blood cultures pending.   Shock: septic vs cardiogenic. Favor the latter.  - ICU hemodynamic monitoring - norepinephrine for MAP goal 65 mmHg - may need CVL  Acute on chronic HFpEF Known multivessel CAD - Unable to diurese due to shock.  - Echocardiogram - Cardiology consulatation  Troponin elevation: likely related to demand.  - Continue to trend troponin, 637 > 5019. Will involve cardiology.   COPD without acute exacerbation Active 1 ppd smoker - does not appear ready to quit - PRN albuterol  Best practice (evaluated daily)   Diet: NPO Pain/Anxiety/Delirium protocol (if indicated): NA VAP protocol (if indicated): NA DVT prophylaxis: heparin infusion GI prophylaxis: NA Glucose control: NA Mobility: BR  last date of multidisciplinary goals of care discussion Family and staff present  Summary of discussion  Follow up goals of care discussion due 12/20 Code Status: FULL Disposition: ICU  Labs   CBC: Recent Labs  Lab 10/10/20 2210 10/10/20 2217 10/10/20 2354  WBC 19.0*  --   --   NEUTROABS 16.2*  --   --   HGB 12.6* 11.9* 11.9*  HCT 39.7 35.0*  35.0*  MCV 88.4  --   --   PLT 240  --   --     Basic Metabolic Panel: Recent Labs  Lab 10/10/20 2210 10/10/20 2217 10/10/20 2354  NA 141 142 140  K 3.8 3.7 4.2  CL 106  --   --   CO2 21*  --   --   GLUCOSE 104*  --   --   BUN 15  --   --   CREATININE 1.34*  --   --   CALCIUM 8.6*  --   --    GFR: CrCl cannot be calculated (Unknown ideal weight.). Recent Labs  Lab 10/10/20 2210 10/10/20 2256  WBC 19.0*  --   LATICACIDVEN  --  3.4*    Liver Function Tests: Recent Labs  Lab 10/10/20 2210  AST 52*  ALT 25  ALKPHOS 57  BILITOT 0.5  PROT 6.5  ALBUMIN 3.2*   No results for input(s): LIPASE, AMYLASE in the last 168 hours. No results for input(s): AMMONIA in the last 168 hours.  ABG    Component Value Date/Time   PHART 7.360 10/10/2020 2354   PCO2ART 38.1 10/10/2020 2354   PO2ART 55 (L) 10/10/2020 2354   HCO3 21.6 10/10/2020 2354   TCO2 23 10/10/2020 2354   ACIDBASEDEF 4.0 (H) 10/10/2020 2354   O2SAT 87.0 10/10/2020 2354     Coagulation Profile: No results for input(s): INR, PROTIME in the last 168 hours.  Cardiac Enzymes: No results for input(s): CKTOTAL, CKMB, CKMBINDEX, TROPONINI in the last 168 hours.  HbA1C: Hgb A1c MFr Bld  Date/Time Value Ref Range Status  05/24/2015 02:22 AM 5.9 (H) 4.8 - 5.6 % Final    Comment:    (NOTE)         Pre-diabetes: 5.7 - 6.4         Diabetes: >6.4         Glycemic control for adults with diabetes: <7.0     CBG: Recent Labs  Lab 10/10/20 2307  GLUCAP 97    Review of Systems:    Bolds are positive  Constitutional: weight loss, gain, night sweats, Fevers, chills, fatigue .  HEENT: headaches, Sore throat, sneezing, nasal congestion, post nasal drip, Difficulty swallowing, Tooth/dental problems, visual complaints visual changes, ear ache CV:  chest pain, radiates:,Orthopnea, PND, swelling in lower extremities, dizziness, palpitations, syncope.  GI  heartburn, indigestion, abdominal pain, nausea, vomiting,  diarrhea, change in bowel habits, loss of appetite, bloody stools.  Resp: cough, productive:, hemoptysis, dyspnea, chest pain, pleuritic.  Skin: rash or itching or icterus GU: dysuria, change in color of urine, urgency or frequency. flank pain, hematuria  MS: joint pain or swelling. decreased range of motion  Psych: change in mood or affect. depression or anxiety.  Neuro: difficulty with speech, weakness, numbness, ataxia    Past Medical History  He,  has a past medical history of Carotid stenosis, COPD (chronic obstructive pulmonary disease) ([place]), History of stroke, Hyperlipidemia, Pituitary tumor, Stenosis of right subclavian artery ([place]), and TIA (transient ischemic attack).   Surgical History  Past Surgical History:  Procedure Laterality Date  . bilateral hip replacements    . ESOPHAGOGASTRODUODENOSCOPY (EGD) WITH PROPOFOL N/A 09/29/2019   Procedure: ESOPHAGOGASTRODUODENOSCOPY (EGD) WITH PROPOFOL;  Surgeon: Yetta Flock, MD;  Location: [place];  Service: Gastroenterology;  Laterality: N/A;  . ESOPHAGOGASTRODUODENOSCOPY (EGD) WITH PROPOFOL N/A 02/18/2020   Procedure: ESOPHAGOGASTRODUODENOSCOPY (EGD) WITH PROPOFOL;  Surgeon: Arta Silence, MD;  Location: [facility];  Service: Endoscopy;  Laterality: N/A;  . HIP ARTHROPLASTY Bilateral    ON DISABILITY SECONDARY   . IR ANGIO INTRA EXTRACRAN SEL COM CAROTID INNOMINATE UNI R MOD SED  07/03/2019  . IR ANGIOGRAM EXTREMITY RIGHT  07/03/2019  . LEFT HEART CATH AND CORONARY ANGIOGRAPHY N/A 09/30/2019   Procedure: LEFT HEART CATH AND CORONARY ANGIOGRAPHY;  Surgeon: Belva Crome, MD;  Location: [facility];  Service: Cardiovascular;  Laterality: N/A;  . OTHER SURGICAL HISTORY  1996   HISTORY OF PITUITARY TUMOR REMOVAL CIRCA  . RADIOLOGY WITH ANESTHESIA N/A 05/26/2015   Procedure: RADIOLOGY WITH ANESTHESIA/ANGIOPLASTY;  Surgeon: Luanne Bras, MD;  Location: [place];  Service: Radiology;  Laterality: N/A;  .  TRANSPHENOIDAL / TRANSNASAL HYPOPHYSECTOMY / RESECTION PITUITARY TUMOR       Social History   reports that he has been smoking cigarettes. He has a 50.80 pack-year smoking history. He has never used smokeless tobacco. He reports that he does not drink alcohol and does not use drugs.   Family History   His family history includes Atrial fibrillation in his mother; CAD in his father and mother; CVA in his father; Colon cancer (age of onset: 9) in his sister; Congestive Heart Failure in his mother; Hypertension in his father and mother; Prostate cancer in his father; Stroke in his father. There is no history of Colon polyps or Liver cancer.   Allergies No Known Allergies   Home Medications  Prior to Admission medications   Medication Sig Start Date End Date Taking? Authorizing Provider  acetaminophen (TYLENOL) 325 MG tablet Take 325 mg by mouth every 6 (six) hours as needed (for discomfort).   Yes [provider]  aspirin 325 MG EC tablet Take 162.5 mg by mouth in the morning.   Yes [provider]  ezetimibe (ZETIA) 10 MG tablet Take 10 mg by mouth daily.  08/03/16  Yes [provider]  Multiple Vitamin (MULTIVITAMIN WITH MINERALS) TABS tablet Take 1 tablet by mouth 3 (three) times a week.   Yes [provider]  pantoprazole (PROTONIX) 40 MG tablet Take 1 tablet (40 mg total) by mouth 2 (two) times daily. Patient taking differently: Take 40 mg by mouth in the morning. 10/01/19  Yes Nita Sells, MD  rosuvastatin (CRESTOR) 20 MG tablet Take 20 mg by mouth daily.   Yes [provider]  aspirin EC 81 MG tablet Take 1 tablet (81 mg total) by mouth daily. 10/10/19   Nita Sells, MD  NEOMYCIN-POLYMYXIN-HYDROCORTISONE (CORTISPORIN) 1 % SOLN OTIC solution Apply 1-2 drops to toe BID after soaking Patient not taking: No sig reported 04/02/20   Garrel Ridgel, DPM     Critical care time: 48 minutes     Georgann Housekeeper, AGACNP-BC [place] for personal pager PCCM on call pager [phone]  10/11/2020 2:52 AM

## 2020-10-11 NOTE — Progress Notes (Signed)
ANTICOAGULATION CONSULT NOTE  Pharmacy Consult for Heparin Indication: chest pain/ACS  No Known Allergies  Patient Measurements: Weight: 68 kg (149 lb 14.6 oz)  Ht 68 in Wt 61 kg   Vital Signs: Temp: 98.9 F (37.2 C) (12/13 1946) Temp Source: Oral (12/13 1946) BP: 124/89 (12/13 1900) Pulse Rate: 87 (12/13 1900)  Labs: Recent Labs    10/10/20 2210 10/10/20 2217 10/10/20 2354 10/11/20 0140 10/11/20 0207 10/11/20 0500 10/11/20 1120 10/11/20 1737  HGB 12.6*   < > 11.9*  --  12.4* 12.3*  --   --   HCT 39.7   < > 35.0*  --  39.0 38.2*  --   --   PLT 240  --   --   --  272 313  --   --   LABPROT  --   --   --   --  12.9  --   --   --   INR  --   --   --   --  1.0  --   --   --   HEPARINUNFRC  --   --   --   --   --   --  <0.10* <0.10*  CREATININE 1.34*  --   --   --  1.27* 1.24  --   --   TROPONINIHS 637*  --   --  5,019*  --   --  92,426* 22,131*   < > = values in this interval not displayed.    CrCl cannot be calculated (Unknown ideal weight.).   Medical History: Past Medical History:  Diagnosis Date  . Carotid stenosis    [place] 8341:  R 96-22; LICA occluded; prox L subcl occluded; R subcl > 50%  . COPD (chronic obstructive pulmonary disease) ([place])   . History of stroke    Echo 7/16:  EF 60-65  . Hyperlipidemia   . Pituitary tumor    s/p excision ~ 2000  . Stenosis of right subclavian artery ([place])    S/P STENT FOLLOWED BY DR. Leonie Man AND DR. [name]   . TIA (transient ischemic attack)      Assessment: 59 y.o. M presents with new onset AF with RVR which has improved. Pharmacy to start heparin for r/o ACS. Trop up to 29,798. No AC PTA.   Heparin level remains undetectable despite rate adjustment.   Goal of Therapy:  Heparin level 0.3-0.7 units/ml Monitor platelets by anticoagulation protocol: Yes   Plan:  Heparin bolus 1500 units x1  Increase heparin infusion to 1150 units/h Recheck heparin level in 8h   Arrie Senate, PharmD, [place], [place]  Pharmacist [phone] Please check AMION for all [facility] Pharmacy numbers 10/11/2020

## 2020-10-11 NOTE — Progress Notes (Signed)
Entered patients room to place arterial line. Patient was very short of breath and labored respirations. Placed patient on bipap to help with increased work of breathing. Patient stated he felt like he was breathing better with bipap. Signs of work of breathing did improve.

## 2020-10-12 ENCOUNTER — Encounter (HOSPITAL_COMMUNITY): Payer: Self-pay | Admitting: Cardiovascular Disease

## 2020-10-12 ENCOUNTER — Encounter (HOSPITAL_COMMUNITY)
Admission: EM | Disposition: A | Discharge status: Home / Self Care | Payer: Self-pay | Source: Home / Self Care | Attending: Internal Medicine

## 2020-10-12 ENCOUNTER — Inpatient Hospital Stay (HOSPITAL_COMMUNITY): Payer: Medicare Other

## 2020-10-12 DIAGNOSIS — I2510 Atherosclerotic heart disease of native coronary artery without angina pectoris: Secondary | ICD-10-CM

## 2020-10-12 DIAGNOSIS — I214 Non-ST elevation (NSTEMI) myocardial infarction: Principal | ICD-10-CM

## 2020-10-12 DIAGNOSIS — I255 Ischemic cardiomyopathy: Secondary | ICD-10-CM

## 2020-10-12 DIAGNOSIS — I480 Paroxysmal atrial fibrillation: Secondary | ICD-10-CM

## 2020-10-12 DIAGNOSIS — I4891 Unspecified atrial fibrillation: Secondary | ICD-10-CM

## 2020-10-12 DIAGNOSIS — I25110 Atherosclerotic heart disease of native coronary artery with unstable angina pectoris: Secondary | ICD-10-CM

## 2020-10-12 HISTORY — PX: RIGHT/LEFT HEART CATH AND CORONARY ANGIOGRAPHY: CATH118266

## 2020-10-12 LAB — POCT I-STAT EG7
Acid-Base Excess: 1 mmol/L (ref 0.0–2.0)
Acid-Base Excess: 2 mmol/L (ref 0.0–2.0)
Acid-Base Excess: 3 mmol/L — ABNORMAL HIGH (ref 0.0–2.0)
Bicarbonate: 25.4 mmol/L (ref 20.0–28.0)
Bicarbonate: 27.3 mmol/L (ref 20.0–28.0)
Bicarbonate: 27.5 mmol/L (ref 20.0–28.0)
Calcium, Ion: 1.18 mmol/L (ref 1.15–1.40)
Calcium, Ion: 1.19 mmol/L (ref 1.15–1.40)
Calcium, Ion: 1.19 mmol/L (ref 1.15–1.40)
HCT: 32 % — ABNORMAL LOW (ref 39.0–52.0)
HCT: 32 % — ABNORMAL LOW (ref 39.0–52.0)
HCT: 32 % — ABNORMAL LOW (ref 39.0–52.0)
Hemoglobin: 10.9 g/dL — ABNORMAL LOW (ref 13.0–17.0)
Hemoglobin: 10.9 g/dL — ABNORMAL LOW (ref 13.0–17.0)
Hemoglobin: 10.9 g/dL — ABNORMAL LOW (ref 13.0–17.0)
O2 Saturation: 62 %
O2 Saturation: 66 %
O2 Saturation: 94 %
Patient temperature: 42
Potassium: 3.7 mmol/L (ref 3.5–5.1)
Potassium: 3.7 mmol/L (ref 3.5–5.1)
Potassium: 3.7 mmol/L (ref 3.5–5.1)
Sodium: 138 mmol/L (ref 135–145)
Sodium: 139 mmol/L (ref 135–145)
Sodium: 139 mmol/L (ref 135–145)
TCO2: 26 mmol/L (ref 22–32)
TCO2: 29 mmol/L (ref 22–32)
TCO2: 29 mmol/L (ref 22–32)
pCO2, Ven: 42 mmHg — ABNORMAL LOW (ref 44.0–60.0)
pCO2, Ven: 43.5 mmHg — ABNORMAL LOW (ref 44.0–60.0)
pCO2, Ven: 46.4 mmHg (ref 44.0–60.0)
pH, Ven: 7.366 (ref 7.250–7.430)
pH, Ven: 7.409 (ref 7.250–7.430)
pH, Ven: 7.421 (ref 7.250–7.430)
pO2, Ven: 32 mmHg (ref 32.0–45.0)
pO2, Ven: 34 mmHg (ref 32.0–45.0)
pO2, Ven: 93 mmHg — ABNORMAL HIGH (ref 32.0–45.0)

## 2020-10-12 LAB — CBC
HCT: 33.2 % — ABNORMAL LOW (ref 39.0–52.0)
Hemoglobin: 10.3 g/dL — ABNORMAL LOW (ref 13.0–17.0)
MCH: 27.2 pg (ref 26.0–34.0)
MCHC: 31 g/dL (ref 30.0–36.0)
MCV: 87.8 fL (ref 80.0–100.0)
Platelets: 213 10*3/uL (ref 150–400)
RBC: 3.78 MIL/uL — ABNORMAL LOW (ref 4.22–5.81)
RDW: 14.8 % (ref 11.5–15.5)
WBC: 14.4 10*3/uL — ABNORMAL HIGH (ref 4.0–10.5)
nRBC: 0 % (ref 0.0–0.2)

## 2020-10-12 LAB — BASIC METABOLIC PANEL
Anion gap: 9 (ref 5–15)
BUN: 11 mg/dL (ref 8–23)
CO2: 25 mmol/L (ref 22–32)
Calcium: 8.5 mg/dL — ABNORMAL LOW (ref 8.9–10.3)
Chloride: 102 mmol/L (ref 98–111)
Creatinine, Ser: 1.04 mg/dL (ref 0.61–1.24)
GFR, Estimated: >60 mL/min (ref >60)
Glucose, Bld: 107 mg/dL — ABNORMAL HIGH (ref 70–99)
Potassium: 3.5 mmol/L (ref 3.5–5.1)
Sodium: 136 mmol/L (ref 135–145)

## 2020-10-12 LAB — COOXEMETRY PANEL
Carboxyhemoglobin: 0.9 % (ref 0.5–1.5)
Methemoglobin: 1.5 % (ref 0.0–1.5)
O2 Saturation: 65.1 %
Total hemoglobin: 10.8 g/dL — ABNORMAL LOW (ref 12.0–16.0)

## 2020-10-12 LAB — MAGNESIUM: Magnesium: 1.8 mg/dL (ref 1.7–2.4)

## 2020-10-12 LAB — HEPARIN LEVEL (UNFRACTIONATED): Heparin Unfractionated: 0.44 IU/mL (ref 0.30–0.70)

## 2020-10-12 LAB — PHOSPHORUS: Phosphorus: 2.2 mg/dL — ABNORMAL LOW (ref 2.5–4.6)

## 2020-10-12 LAB — POCT ACTIVATED CLOTTING TIME: Activated Clotting Time: 130 seconds

## 2020-10-12 SURGERY — RIGHT/LEFT HEART CATH AND CORONARY ANGIOGRAPHY
Anesthesia: LOCAL

## 2020-10-12 MED ORDER — ROSUVASTATIN CALCIUM 20 MG PO TABS
20.0000 mg | ORAL_TABLET | Freq: Every day | ORAL | Status: DC
  Administered 2020-10-12: 13:00:00 20 mg via ORAL
  Filled 2020-10-12: qty 1

## 2020-10-12 MED ORDER — FENTANYL CITRATE (PF) 100 MCG/2ML IJ SOLN
INTRAMUSCULAR | Status: DC | PRN
  Administered 2020-10-12: 25 ug via INTRAVENOUS

## 2020-10-12 MED ORDER — HEPARIN (PORCINE) IN NACL 1000-0.9 UT/500ML-% IV SOLN
INTRAVENOUS | Status: AC
  Filled 2020-10-12: qty 1000

## 2020-10-12 MED ORDER — LIDOCAINE HCL (PF) 1 % IJ SOLN
INTRAMUSCULAR | Status: AC
  Filled 2020-10-12: qty 30

## 2020-10-12 MED ORDER — HEPARIN (PORCINE) IN NACL 1000-0.9 UT/500ML-% IV SOLN
INTRAVENOUS | Status: DC | PRN
  Administered 2020-10-12 (×2): 500 mL

## 2020-10-12 MED ORDER — PANTOPRAZOLE SODIUM 40 MG PO TBEC
40.0000 mg | DELAYED_RELEASE_TABLET | Freq: Every day | ORAL | Status: DC
  Administered 2020-10-12 – 2020-10-14 (×3): 40 mg via ORAL
  Filled 2020-10-12 (×3): qty 1

## 2020-10-12 MED ORDER — MIDAZOLAM HCL 2 MG/2ML IJ SOLN
INTRAMUSCULAR | Status: AC
  Filled 2020-10-12: qty 2

## 2020-10-12 MED ORDER — MIDAZOLAM HCL 2 MG/2ML IJ SOLN
INTRAMUSCULAR | Status: DC | PRN
  Administered 2020-10-12: 1 mg via INTRAVENOUS

## 2020-10-12 MED ORDER — FENTANYL CITRATE (PF) 100 MCG/2ML IJ SOLN
INTRAMUSCULAR | Status: AC
  Filled 2020-10-12: qty 2

## 2020-10-12 MED ORDER — HEPARIN (PORCINE) 25000 UT/250ML-% IV SOLN
1150.0000 [IU]/h | INTRAVENOUS | Status: DC
  Administered 2020-10-12 – 2020-10-13 (×2): 1150 [IU]/h via INTRAVENOUS
  Filled 2020-10-12 (×2): qty 250

## 2020-10-12 MED ORDER — LIDOCAINE HCL (PF) 1 % IJ SOLN
INTRAMUSCULAR | Status: DC | PRN
  Administered 2020-10-12: 15 mL

## 2020-10-12 MED ORDER — IOHEXOL 350 MG/ML SOLN
INTRAVENOUS | Status: DC | PRN
  Administered 2020-10-12: 10:00:00 55 mL

## 2020-10-12 SURGICAL SUPPLY — 10 items
CATH INFINITI 5FR MULTPACK ANG (CATHETERS) ×1 IMPLANT
CATH SWAN GANZ 7F STRAIGHT (CATHETERS) ×1 IMPLANT
KIT HEART LEFT (KITS) ×2 IMPLANT
PACK CARDIAC CATHETERIZATION (CUSTOM PROCEDURE TRAY) ×2 IMPLANT
SHEATH PINNACLE 5F 10CM (SHEATH) ×1 IMPLANT
SHEATH PINNACLE 7F 10CM (SHEATH) ×1 IMPLANT
SHEATH PROBE COVER 6X72 (BAG) ×1 IMPLANT
TRANSDUCER W/STOPCOCK (MISCELLANEOUS) ×2 IMPLANT
TUBING CIL FLEX 10 FLL-RA (TUBING) ×2 IMPLANT
WIRE EMERALD 3MM-J .035X150CM (WIRE) ×1 IMPLANT

## 2020-10-12 NOTE — Progress Notes (Signed)
Progress Note  Patient Name: Raymond Carney Date of Encounter: 10/12/2020  [facility] Cardiologist: Candee Furbish, MD   Subjective   Denies any chest pain or dyspnea.  Remains on low-dose Levophed  Inpatient Medications    Scheduled Meds: . amiodarone  400 mg Oral BID  . aspirin EC  81 mg Oral Daily  . Chlorhexidine Gluconate Cloth  6 each Topical Daily  . pantoprazole  40 mg Oral Daily  . rosuvastatin  20 mg Oral Daily  . sodium chloride flush  3 mL Intravenous Q12H   Continuous Infusions: . sodium chloride 10 mL/hr at 10/12/20 0700  . sodium chloride    . sodium chloride    . azithromycin 250 mL/hr at 10/12/20 0700  . cefTRIAXone (ROCEPHIN)  IV Stopped (10/11/20 2115)  . heparin 1,150 Units/hr (10/12/20 0700)  . norepinephrine (LEVOPHED) Adult infusion 5 mcg/min (10/12/20 0700)   PRN Meds: sodium chloride, sodium chloride flush   Vital Signs    Vitals:   10/12/20 0600 10/12/20 0700 10/12/20 0800 10/12/20 0810  BP: 99/85 127/75 93/70   Pulse: 77 73 75   Resp: 18 (!) 22 (!) 29   Temp:    98.4 F (36.9 C)  TempSrc:    Oral  SpO2: 100% 100% 100%   Weight:        Intake/Output Summary (Last 24 hours) at 10/12/2020 0823 Last data filed at 10/12/2020 0700 Gross per 24 hour  Intake 1452.59 ml  Output 1200 ml  Net 252.59 ml   Last 3 Weights 10/12/2020 10/11/2020 10/11/2020  Weight (lbs) 149 lb 14.6 oz 149 lb 14.6 oz 149 lb 14.6 oz  Weight (kg) 68 kg 68 kg 68 kg      Telemetry    Normal sinus rhythm, rate 70s to 80s personally Reviewed  ECG    No new EKG- Personally Reviewed  Physical Exam   GEN: No acute distress.   Neck: No JVD Cardiac: RRR, 2/6 systolic murmur Respiratory: Clear to auscultation bilaterally. GI: Soft, nontender, non-distended  MS: No edema; No deformity. Neuro:  Nonfocal  Psych: Normal affect   Labs    High Sensitivity Troponin:   Recent Labs  Lab 10/10/20 2210 10/11/20 0140 10/11/20 1120 10/11/20 1737   TROPONINIHS 637* 5,019* 25,844* 22,131*      Chemistry Recent Labs  Lab 10/10/20 2210 10/10/20 2217 10/10/20 2354 10/11/20 0207 10/11/20 0500 10/12/20 0411  NA 141   < > 140  --  137 136  K 3.8   < > 4.2  --  4.6 3.5  CL 106  --   --   --  105 102  CO2 21*  --   --   --  19* 25  GLUCOSE 104*  --   --   --  132* 107*  BUN 15  --   --   --  16 11  CREATININE 1.34*  --   --  1.27* 1.24 1.04  CALCIUM 8.6*  --   --   --  8.3* 8.5*  PROT 6.5  --   --   --   --   --   ALBUMIN 3.2*  --   --   --   --   --   AST 52*  --   --   --   --   --   ALT 25  --   --   --   --   --   ALKPHOS 57  --   --   --   --   --  BILITOT 0.5  --   --   --   --   --   GFRNONAA >60  --   --  >60 >60 >60  ANIONGAP 14  --   --   --  13 9   < > = values in this interval not displayed.     Hematology Recent Labs  Lab 10/11/20 0207 10/11/20 0500 10/12/20 0411  WBC 16.5* 16.0* 14.4*  RBC 4.47 4.44 3.78*  HGB 12.4* 12.3* 10.3*  HCT 39.0 38.2* 33.2*  MCV 87.2 86.0 87.8  MCH 27.7 27.7 27.2  MCHC 31.8 32.2 31.0  RDW 14.8 14.6 14.8  PLT 272 313 213    BNP Recent Labs  Lab 10/10/20 2255  BNP 847.8*     DDimer No results for input(s): DDIMER in the last 168 hours.   Radiology    DG Chest 1 View  Result Date: 10/11/2020 CLINICAL DATA:  Central catheter placement EXAM: CHEST  1 VIEW COMPARISON:  October 11, 2020 study obtained earlier in the day FINDINGS: Central catheter tip is in the superior vena cava. No pneumothorax. Airspace opacity in the perihilar regions bilaterally is slightly less compared to earlier in the day. No new opacity evident. Heart size and pulmonary vascular normal. No adenopathy. Old healed fracture lateral left clavicle noted. IMPRESSION: Central catheter tip in superior vena cava without pneumothorax. Slightly less airspace opacity bilaterally compared to earlier in the day. No new opacity. Stable cardiac silhouette. Electronically Signed   By: Lowella Grip III M.D.    On: 10/11/2020 12:03   DG Chest Port 1 View  Result Date: 10/12/2020 CLINICAL DATA:  Respiratory failure. EXAM: PORTABLE CHEST 1 VIEW COMPARISON:  10/11/2020 FINDINGS: A left jugular catheter terminates over the mid upper SVC, unchanged. The cardiac silhouette is upper limits of normal in size. Aortic atherosclerosis is noted. Right greater than left perihilar and basilar airspace opacities have not significantly changed. No sizable pleural effusion is identified although the right lateral costophrenic angle was incompletely imaged. No pneumothorax is identified. IMPRESSION: Unchanged right greater than left lung airspace opacities which may reflect edema or pneumonia. Electronically Signed   By: Logan Bores M.D.   On: 10/12/2020 06:47   DG Chest Port 1 View  Result Date: 10/11/2020 CLINICAL DATA:  Hypoxia EXAM: PORTABLE CHEST 1 VIEW COMPARISON:  12/12/2019 FINDINGS: Bilateral perihilar airspace opacities are again noted similar prior study. Heart is mildly enlarged. No effusions or pneumothorax. IMPRESSION: Stable bilateral perihilar airspace disease. Electronically Signed   By: Rolm Baptise M.D.   On: 10/11/2020 02:26   DG Chest Portable 1 View  Result Date: 10/10/2020 CLINICAL DATA:  Weakness, atrial fibrillation EXAM: PORTABLE CHEST 1 VIEW COMPARISON:  02/02/2015 FINDINGS: Bilateral perihilar airspace disease. Mild cardiomegaly. No effusions. No acute bony abnormality. IMPRESSION: Bilateral perihilar opacities, likely edema/CHF. Electronically Signed   By: Rolm Baptise M.D.   On: 10/10/2020 21:53   ECHOCARDIOGRAM COMPLETE  Result Date: 10/11/2020    ECHOCARDIOGRAM REPORT   Patient Name:   Raymond Carney [facility] Date of Exam: 10/11/2020 Medical Rec #:  [id number]      Height:       68.0 in Accession #:    [id number]     Weight:       149.9 lb Date of Birth:  05/06/1961      BSA:          1.808 m Patient Age:    59 years       BP:  93/74 mmHg Patient Gender: M              HR:           72  bpm. Exam Location:  Inpatient Procedure: 2D Echo Indications:    acute diastolic chf 297.98  History:        Patient has prior history of Echocardiogram examinations, most                 recent 09/28/2019. Pneumonia; Signs/Symptoms:elevated troponin.  Sonographer:    Johny Chess Referring Phys: [place]  1. Left ventricular ejection fraction, by estimation, is 35%. The left ventricle has moderately decreased function. The left ventricle demonstrates regional wall motion abnormalities with basal to mid inferolateral and anterolateral akinesis. There is mild left ventricular hypertrophy. Left ventricular diastolic parameters are consistent with Grade II diastolic dysfunction (pseudonormalization).  2. Right ventricular systolic function is normal. The right ventricular size is normal. There is mildly elevated pulmonary artery systolic pressure. The estimated right ventricular systolic pressure is 92.1 mmHg.  3. Left atrial size was mildly dilated.  4. The mitral valve is normal in structure. Mild mitral valve regurgitation. No evidence of mitral stenosis.  5. Tricuspid valve regurgitation is moderate.  6. The aortic valve is tricuspid. Aortic valve regurgitation is not visualized. Mild aortic valve sclerosis is present, with no evidence of aortic valve stenosis.  7. The inferior vena cava is normal in size with greater than 50% respiratory variability, suggesting right atrial pressure of 3 mmHg. FINDINGS  Left Ventricle: Left ventricular ejection fraction, by estimation, is 35%. The left ventricle has moderately decreased function. The left ventricle demonstrates regional wall motion abnormalities. The left ventricular internal cavity size was normal in size. There is mild left ventricular hypertrophy. Left ventricular diastolic parameters are consistent with Grade II diastolic dysfunction (pseudonormalization). Right Ventricle: The right ventricular size is normal. No increase in right  ventricular wall thickness. Right ventricular systolic function is normal. There is mildly elevated pulmonary artery systolic pressure. The tricuspid regurgitant velocity is 3.01  m/s, and with an assumed right atrial pressure of 3 mmHg, the estimated right ventricular systolic pressure is 19.4 mmHg. Left Atrium: Left atrial size was mildly dilated. Right Atrium: Right atrial size was normal in size. Pericardium: There is no evidence of pericardial effusion. Mitral Valve: The mitral valve is normal in structure. Mild mitral annular calcification. Mild mitral valve regurgitation. No evidence of mitral valve stenosis. Tricuspid Valve: The tricuspid valve is normal in structure. Tricuspid valve regurgitation is moderate. Aortic Valve: The aortic valve is tricuspid. Aortic valve regurgitation is not visualized. Mild aortic valve sclerosis is present, with no evidence of aortic valve stenosis. Pulmonic Valve: The pulmonic valve was normal in structure. Pulmonic valve regurgitation is not visualized. Aorta: The aortic root is normal in size and structure. Venous: The inferior vena cava is normal in size with greater than 50% respiratory variability, suggesting right atrial pressure of 3 mmHg. IAS/Shunts: No atrial level shunt detected by color flow Doppler.  LEFT VENTRICLE PLAX 2D LVIDd:         4.80 cm  Diastology LVIDs:         4.20 cm  LV e' medial:    5.44 cm/s LV PW:         1.40 cm  LV E/e' medial:  16.6 LV IVS:        1.30 cm  LV e' lateral:   5.98 cm/s LVOT diam:  2.00 cm  LV E/e' lateral: 15.1 LV SV:         42 LV SV Index:   23 LVOT Area:     3.14 cm  RIGHT VENTRICLE             IVC RV S prime:     11.40 cm/s  IVC diam: 1.20 cm TAPSE (M-mode): 2.1 cm LEFT ATRIUM             Index       RIGHT ATRIUM           Index LA diam:        4.10 cm 2.27 cm/m  RA Area:     13.50 cm LA Vol (A2C):   80.3 ml 44.41 ml/m RA Volume:   33.20 ml  18.36 ml/m LA Vol (A4C):   55.7 ml 30.81 ml/m LA Biplane Vol: 68.6 ml 37.94  ml/m  AORTIC VALVE LVOT Vmax:   75.70 cm/s LVOT Vmean:  45.200 cm/s LVOT VTI:    0.133 m  AORTA Ao Root diam: 3.30 cm Ao Asc diam:  3.40 cm MITRAL VALVE               TRICUSPID VALVE MV Area (PHT): 3.48 cm    TR Peak grad:   36.2 mmHg MV Decel Time: 218 msec    TR Vmax:        301.00 cm/s MV E velocity: 90.40 cm/s MV A velocity: 40.70 cm/s  SHUNTS MV E/A ratio:  2.22        Systemic VTI:  0.13 m                            Systemic Diam: 2.00 cm Loralie Champagne MD Electronically signed by Loralie Champagne MD Signature Date/Time: 10/11/2020/1:56:24 PM    Final     Cardiac Studies   Echo 10/11/20: 1. Left ventricular ejection fraction, by estimation, is 35%. The left  ventricle has moderately decreased function. The left ventricle  demonstrates regional wall motion abnormalities with basal to mid  inferolateral and anterolateral akinesis. There is  mild left ventricular hypertrophy. Left ventricular diastolic parameters  are consistent with Grade II diastolic dysfunction (pseudonormalization).  2. Right ventricular systolic function is normal. The right ventricular  size is normal. There is mildly elevated pulmonary artery systolic  pressure. The estimated right ventricular systolic pressure is 16.1 mmHg.  3. Left atrial size was mildly dilated.  4. The mitral valve is normal in structure. Mild mitral valve  regurgitation. No evidence of mitral stenosis.  5. Tricuspid valve regurgitation is moderate.  6. The aortic valve is tricuspid. Aortic valve regurgitation is not  visualized. Mild aortic valve sclerosis is present, with no evidence of  aortic valve stenosis.  7. The inferior vena cava is normal in size with greater than 50%  respiratory variability, suggesting right atrial pressure of 3 mmHg.   Patient Profile     59 y.o. male with a hx of evere PAD, multivessel obstructive CAD, GI bleed, right subclavian stenosis, tobacco use who presented with shock   Assessment & Plan     Shock:  Suspect cardiogenic in setting of NSTEMI, troponin peak 25,844. Lactate 3.4 on admission, has normalized.  Currently on Levophed at 5 mcg/min.    Septic shock on ddx, procalcitonin 0.58.  Cultures NGTD.  Echocardiogram 12/13 shows EF 35% with lateral akinesis.  CVC placed, normal CVP (4) and Co-ox (69%).  Suspect  A. fib with RVR on admission was contributing to shock, has improved significantly since converting to sinus rhythm -RHC/LHC today  A. fib with RVR: Presented with A. fib with RVR, rates 130s.  Converted to sinus rhythm overnight -Continue heparin drip -Suspect Afib was likely contributing to his shock on presentation, would load with amiodarone to maintain sinus rhythm.  Plan 400mg  BID x 1 week, then decrease to 400 mg daily x1 week, then 200 mg daily  NSTEMI: Denies any chest pain, but troponin peaked at [address] and new systolic dysfunction on echo (EF 35%) with regional wall motion abnormalities -Plan RHC/LHC today   For questions or updates, please contact [facility] Please consult [email or web address] for contact info under        Signed, Donato Heinz, MD  10/12/2020, 8:23 AM

## 2020-10-12 NOTE — Progress Notes (Signed)
ANTICOAGULATION CONSULT NOTE - Initial Consult  Pharmacy Consult for Heparin Indication: chest pain/ACS  No Known Allergies  Patient Measurements: Weight: 68 kg (149 lb 14.6 oz)  Ht 68 in Wt 61 kg   Vital Signs: Temp: 97.7 F (36.5 C) (12/14 1115) Temp Source: Oral (12/14 1115) BP: 116/84 (12/14 1300) Pulse Rate: 81 (12/14 1300)  Labs: Recent Labs    10/11/20 0140 10/11/20 0207 10/11/20 0500 10/11/20 1120 10/11/20 1737 10/12/20 0411  HGB  --  12.4* 12.3*  --   --  10.3*  HCT  --  39.0 38.2*  --   --  33.2*  PLT  --  272 313  --   --  213  LABPROT  --  12.9  --   --   --   --   INR  --  1.0  --   --   --   --   HEPARINUNFRC  --   --   --  <0.10* <0.10* 0.44  CREATININE  --  1.27* 1.24  --   --  1.04  TROPONINIHS 5,019*  --   --  09,381* 22,131*  --     CrCl cannot be calculated (Unknown ideal weight.).   Medical History: Past Medical History:  Diagnosis Date  . Carotid stenosis    [place] 8299:  R 37-16; LICA occluded; prox L subcl occluded; R subcl > 50%  . COPD (chronic obstructive pulmonary disease) ([place])   . History of stroke    Echo 7/16:  EF 60-65  . Hyperlipidemia   . Pituitary tumor    s/p excision ~ 2000  . Stenosis of right subclavian artery ([place])    S/P STENT FOLLOWED BY DR. Leonie Man AND DR. [name]   . TIA (transient ischemic attack)     Medications:  See electronic med rec  Assessment: 59 y.o. M presents with new onset AF with RVR which has improved. Pharmacy to start heparin for r/o ACS. Trop up to 96,789. No AC PTA.   Underwent cardiac cath finding severe multivessel CAD - plan for CTVS eval. Plan to restart heparin infusion 8 hours after sheath removal (documented on 12/14@1024 ). Heparin was therapeutic earlier at 1150 units/hr. Hgb 10.3, plt 213. No s/sx of bleeding.   Goal of Therapy:  Heparin level 0.3-0.7 units/ml Monitor platelets by anticoagulation protocol: Yes   Plan:  Restart heparin infusion at 1150 units/hr on  12/14@1830  Daily heparin level and CBC  Antonietta Jewel, PharmD, [place] Pharmacist  Phone: [phone] 10/12/2020 1:44 PM  Please check AMION for all [place] phone numbers After 10:00 PM, call [place] [phone]

## 2020-10-12 NOTE — Consult Note (Signed)
[address]       [place] [address]             [phone]      Cardiothoracic Surgery Consultation  Reason for Consult: Severe multi-vessel coronary artery disease Referring Physician: Dr. Shelva Majestic  XZAYVIER FAGIN is an 59 y.o. male.  HPI:   The patient is a 59 year old gentleman with a history of hyperlipidemia, heavy smoking and COPD, severe cerebrovascular disease with total occlusion of the left CCA, ECA and ICA and 80 to 99% stenosis in the right ICA and prior stroke, diffuse peripheral vascular disease with high-grade right subclavian artery stenosis and occlusion of the proximal left subclavian artery, occlusion of the left common iliac and superficial femoral arteries and severe peripheral vascular disease in the right lower extremity, high-grade bilateral proximal renal artery stenosis with right renal atrophy, history of severe three-vessel coronary disease by catheterization 09/2019 when he was admitted with GI bleeding who presented on 10/10/2020 with weakness and shortness of breath.  He was noted to be in atrial fibrillation with rapid ventricular response in the 120s.  He was given 2 L of intravenous fluids and subsequently became short of breath, hypoxemic, and hypotensive.  He was started on BiPAP and Levophed and admitted to the ICU.  He ruled in for non-ST segment elevation MI with a high-sensitivity troponin of 637 rising to 5019, 25,844, and 22131.  BNP was 848.  Lactate was 3.4.  White blood cell count was 19 with a procalcitonin of 0.58.  2D echocardiogram showed ejection fraction of 35% with severe wall motion abnormalities.  There is mild LVH with grade 2 diastolic dysfunction.  There is mild mitral valve regurgitation and mild aortic valve sclerosis but no stenosis.  He underwent cardiac catheterization today which showed severe three-vessel coronary disease that was essentially unchanged from his catheterization 1 year ago.  PA pressure was  mildly elevated at 43/23 with a cardiac index of 3.16 and an LVEDP of 15.  He denies any chest pain or pressure at home.  He has had recent exertional fatigue and shortness of breath.  He has had no peripheral edema.  He denies any dizziness or syncope.  The patient lives alone [place] in [place] but has multiple family members living on the same land.    Past Medical History:  Diagnosis Date  . Carotid stenosis    [place] 0932:  R 67-12; LICA occluded; prox L subcl occluded; R subcl > 50%  . COPD (chronic obstructive pulmonary disease) ([place])   . History of stroke    Echo 7/16:  EF 60-65  . Hyperlipidemia   . Pituitary tumor    s/p excision ~ 2000  . Stenosis of right subclavian artery ([place])    S/P STENT FOLLOWED BY DR. Leonie Man AND DR. [name]   . TIA (transient ischemic attack)     Past Surgical History:  Procedure Laterality Date  . bilateral hip replacements    . ESOPHAGOGASTRODUODENOSCOPY (EGD) WITH PROPOFOL N/A 09/29/2019   Procedure: ESOPHAGOGASTRODUODENOSCOPY (EGD) WITH PROPOFOL;  Surgeon: Yetta Flock, MD;  Location: [facility];  Service: Gastroenterology;  Laterality: N/A;  . ESOPHAGOGASTRODUODENOSCOPY (EGD) WITH PROPOFOL N/A 02/18/2020   Procedure: ESOPHAGOGASTRODUODENOSCOPY (EGD) WITH PROPOFOL;  Surgeon: Arta Silence, MD;  Location[facility];  Service: Endoscopy;  Laterality: N/A;  . HIP ARTHROPLASTY Bilateral    ON DISABILITY SECONDARY   . IR ANGIO INTRA EXTRACRAN SEL COM CAROTID  INNOMINATE UNI R MOD SED  07/03/2019  . IR ANGIOGRAM EXTREMITY RIGHT  07/03/2019  . LEFT HEART CATH AND CORONARY ANGIOGRAPHY N/A 09/30/2019   Procedure: LEFT HEART CATH AND CORONARY ANGIOGRAPHY;  Surgeon: Belva Crome, MD;  Location: [facility];  Service: Cardiovascular;  Laterality: N/A;  . OTHER SURGICAL HISTORY  1996   HISTORY OF PITUITARY TUMOR REMOVAL CIRCA  . RADIOLOGY WITH ANESTHESIA N/A 05/26/2015   Procedure: RADIOLOGY WITH ANESTHESIA/ANGIOPLASTY;   Surgeon: Luanne Bras, MD;  Location: [facility];  Service: Radiology;  Laterality: N/A;  . RIGHT/LEFT HEART CATH AND CORONARY ANGIOGRAPHY N/A 10/12/2020   Procedure: RIGHT/LEFT HEART CATH AND CORONARY ANGIOGRAPHY;  Surgeon: Troy Sine, MD;  Location: [facility];  Service: Cardiovascular;  Laterality: N/A;  . TRANSPHENOIDAL / TRANSNASAL HYPOPHYSECTOMY / RESECTION PITUITARY TUMOR      Family History  Problem Relation Age of Onset  . Hypertension Mother   . CAD Mother   . Congestive Heart Failure Mother   . Atrial fibrillation Mother   . Stroke Father   . Hypertension Father   . CVA Father   . CAD Father        s/p CABG  . Prostate cancer Father   . Colon cancer Sister 67  . Colon polyps Neg Hx   . Liver cancer Neg Hx     Social History:  reports that he has been smoking cigarettes. He has a 50.80 pack-year smoking history.  He used to smoke 2 packs of cigarettes per day but now is down to about 1-1/2 pack every 4 to 5 days.  He has never used smokeless tobacco. He reports that he does not drink alcohol and does not use drugs.  Allergies: No Known Allergies  Medications:  I have reviewed the patient's current medications. Prior to Admission:  Medications Prior to Admission  Medication Sig Dispense Refill Last Dose  . acetaminophen (TYLENOL) 325 MG tablet Take 325 mg by mouth every 6 (six) hours as needed (for discomfort).   10/10/2020 at Unknown time  . aspirin 325 MG EC tablet Take 162.5 mg by mouth in the morning.   10/10/2020 at 0600  . ezetimibe (ZETIA) 10 MG tablet Take 10 mg by mouth daily.    10/10/2020 at am  . Multiple Vitamin (MULTIVITAMIN WITH MINERALS) TABS tablet Take 1 tablet by mouth 3 (three) times a week.   Past Week at Unknown time  . pantoprazole (PROTONIX) 40 MG tablet Take 1 tablet (40 mg total) by mouth 2 (two) times daily. (Patient taking differently: Take 40 mg by mouth in the morning.) 60 tablet 2 10/10/2020 at am  . rosuvastatin (CRESTOR) 20  MG tablet Take 20 mg by mouth daily.   10/10/2020 at am  . aspirin EC 81 MG tablet Take 1 tablet (81 mg total) by mouth daily. 90 tablet 3   . NEOMYCIN-POLYMYXIN-HYDROCORTISONE (CORTISPORIN) 1 % SOLN OTIC solution Apply 1-2 drops to toe BID after soaking (Patient not taking: No sig reported) 10 mL 1 Not Taking at Unknown time   Scheduled: . amiodarone  400 mg Oral BID  . aspirin EC  81 mg Oral Daily  . Chlorhexidine Gluconate Cloth  6 each Topical Daily  . pantoprazole  40 mg Oral Daily  . rosuvastatin  20 mg Oral Daily  . sodium chloride flush  3 mL Intravenous Q12H   Continuous: . sodium chloride 10 mL/hr at 10/12/20 0915  . azithromycin 250 mL/hr at 10/12/20 0915  .  cefTRIAXone (ROCEPHIN)  IV Stopped (10/11/20 2115)  . heparin    . norepinephrine (LEVOPHED) Adult infusion Stopped (10/12/20 4098)   PRN: Anti-infectives (From admission, onward)   Start     Dose/Rate Route Frequency Ordered Stop   10/10/20 2230  cefTRIAXone (ROCEPHIN) 2 g in sodium chloride 0.9 % 100 mL IVPB        2 g 200 mL/hr over 30 Minutes Intravenous Every 24 hours 10/10/20 2223 10/15/20 2229   10/10/20 2230  azithromycin (ZITHROMAX) 500 mg in sodium chloride 0.9 % 250 mL IVPB        500 mg 250 mL/hr over 60 Minutes Intravenous Every 24 hours 10/10/20 2223 10/15/20 2229      Results for orders placed or performed during the hospital encounter of 10/10/20 (from the past 48 hour(s))  Comprehensive metabolic panel     Status: Abnormal   Collection Time: 10/10/20 10:10 PM  Result Value Ref Range   Sodium 141 135 - 145 mmol/L   Potassium 3.8 3.5 - 5.1 mmol/L   Chloride 106 98 - 111 mmol/L   CO2 21 (L) 22 - 32 mmol/L   Glucose, Bld 104 (H) 70 - 99 mg/dL    Comment: Glucose reference range applies only to samples taken after fasting for at least 8 hours.   BUN 15 8 - 23 mg/dL   Creatinine, Ser 1.34 (H) 0.61 - 1.24 mg/dL   Calcium 8.6 (L) 8.9 - 10.3 mg/dL   Total Protein 6.5 6.5 - 8.1 g/dL   Albumin 3.2 (L)  3.5 - 5.0 g/dL   AST 52 (H) 15 - 41 U/L   ALT 25 0 - 44 U/L   Alkaline Phosphatase 57 38 - 126 U/L   Total Bilirubin 0.5 0.3 - 1.2 mg/dL   GFR, Estimated >60 >60 mL/min    Comment: (NOTE) Calculated using the CKD-EPI Creatinine Equation (2021)    Anion gap 14 5 - 15    Comment: Performed at [facility], [place] [address], [place], [place] [address]  CBC with Differential     Status: Abnormal   Collection Time: 10/10/20 10:10 PM  Result Value Ref Range   WBC 19.0 (H) 4.0 - 10.5 K/uL   RBC 4.49 4.22 - 5.81 MIL/uL   Hemoglobin 12.6 (L) 13.0 - 17.0 g/dL   HCT 39.7 39.0 - 52.0 %   MCV 88.4 80.0 - 100.0 fL   MCH 28.1 26.0 - 34.0 pg   MCHC 31.7 30.0 - 36.0 g/dL   RDW 14.7 11.5 - 15.5 %   Platelets 240 150 - 400 K/uL   nRBC 0.0 0.0 - 0.2 %   Neutrophils Relative % 85 %   Neutro Abs 16.2 (H) 1.7 - 7.7 K/uL   Lymphocytes Relative 7 %   Lymphs Abs 1.3 0.7 - 4.0 K/uL   Monocytes Relative 7 %   Monocytes Absolute 1.3 (H) 0.1 - 1.0 K/uL   Eosinophils Relative 0 %   Eosinophils Absolute 0.0 0.0 - 0.5 K/uL   Basophils Relative 0 %   Basophils Absolute 0.1 0.0 - 0.1 K/uL   Immature Granulocytes 1 %   Abs Immature Granulocytes 0.13 (H) 0.00 - 0.07 K/uL    Comment: Performed at [facility], [address], [place], [place] [address]  Troponin I (High Sensitivity)     Status: Abnormal   Collection Time: 10/10/20 10:10 PM  Result Value Ref Range   Troponin I (High Sensitivity) 637 (HH) <18 ng/L  Comment: CRITICAL RESULT CALLED TO, READ BACK BY AND VERIFIED WITH: Irene Shipper RN [phone] M GARRETT (NOTE) Elevated high sensitivity troponin I (hsTnI) values and significant  changes across serial measurements may suggest ACS but many other  chronic and acute conditions are known to elevate hsTnI results.  Refer to the Links section for chest pain algorithms and additional  guidance. Performed at [facility], [place] [address], [place], [place] [address]   Resp Panel  by RT-PCR (Flu A&B, Covid) Nasopharyngeal Swab     Status: None   Collection Time: 10/10/20 10:10 PM   Specimen: Nasopharyngeal Swab; Nasopharyngeal(NP) swabs in vial transport medium  Result Value Ref Range   SARS Coronavirus 2 by RT PCR NEGATIVE NEGATIVE    Comment: (NOTE) SARS-CoV-2 target nucleic acids are NOT DETECTED.  The SARS-CoV-2 RNA is generally detectable in upper respiratory specimens during the acute phase of infection. The lowest concentration of SARS-CoV-2 viral copies this assay can detect is 138 copies/mL. A negative result does not preclude SARS-Cov-2 infection and should not be used as the sole basis for treatment or other patient management decisions. A negative result may occur with  improper specimen collection/handling, submission of specimen other than nasopharyngeal swab, presence of viral mutation(s) within the areas targeted by this assay, and inadequate number of viral copies(<138 copies/mL). A negative result must be combined with clinical observations, patient history, and epidemiological information. The expected result is Negative.  Fact Sheet for Patients:  EntrepreneurPulse.com.au  Fact Sheet for Healthcare Providers:  IncredibleEmployment.be  This test is no t yet approved or cleared by the [place] FDA and  has been authorized for detection and/or diagnosis of SARS-CoV-2 by FDA under an Emergency Use Authorization (EUA). This EUA will remain  in effect (meaning this test can be used) for the duration of the COVID-19 declaration under Section 564(b)(1) of the Act, 21 U.S.C.section 360bbb-3(b)(1), unless the authorization is terminated  or revoked sooner.       Influenza A by PCR NEGATIVE NEGATIVE   Influenza B by PCR NEGATIVE NEGATIVE    Comment: (NOTE) The Xpert Xpress SARS-CoV-2/FLU/RSV plus assay is intended as an aid in the diagnosis of influenza from Nasopharyngeal swab specimens and should not  be used as a sole basis for treatment. Nasal washings and aspirates are unacceptable for Xpert Xpress SARS-CoV-2/FLU/RSV testing.  Fact Sheet for Patients: EntrepreneurPulse.com.au  Fact Sheet for Healthcare Providers: IncredibleEmployment.be  This test is not yet approved or cleared by the [place] FDA and has been authorized for detection and/or diagnosis of SARS-CoV-2 by FDA under an Emergency Use Authorization (EUA). This EUA will remain in effect (meaning this test can be used) for the duration of the COVID-19 declaration under Section 564(b)(1) of the Act, 21 U.S.C. section 360bbb-3(b)(1), unless the authorization is terminated or revoked.  Performed at [facility], [place] [address] [place], [place] [address]   I-Stat venous blood gas, ED     Status: Abnormal   Collection Time: 10/10/20 10:17 PM  Result Value Ref Range   pH, Ven 7.360 7.250 - 7.430   pCO2, Ven 40.4 (L) 44.0 - 60.0 mmHg   pO2, Ven 34.0 32.0 - 45.0 mmHg   Bicarbonate 22.8 20.0 - 28.0 mmol/L   TCO2 24 22 - 32 mmol/L   O2 Saturation 64.0 %   Acid-base deficit 2.0 0.0 - 2.0 mmol/L   Sodium 142 135 - 145 mmol/L   Potassium 3.7 3.5 - 5.1 mmol/L  Calcium, Ion 1.04 (L) 1.15 - 1.40 mmol/L   HCT 35.0 (L) 39.0 - 52.0 %   Hemoglobin 11.9 (L) 13.0 - 17.0 g/dL   Sample type VENOUS    Comment NOTIFIED PHYSICIAN   Brain natriuretic peptide     Status: Abnormal   Collection Time: 10/10/20 10:55 PM  Result Value Ref Range   B Natriuretic Peptide 847.8 (H) 0.0 - 100.0 pg/mL    Comment: Performed at [facility], [place] [address] [place], [place] [address]  Lactic acid, plasma     Status: Abnormal   Collection Time: 10/10/20 10:56 PM  Result Value Ref Range   Lactic Acid, Venous 3.4 (HH) 0.5 - 1.9 mmol/L    Comment: CRITICAL RESULT CALLED TO, READ BACK BY AND VERIFIED WITH: Loren Racer RN [phone] Sander Radon Performed at [facility] [address], [place], [place] [address]   Culture, blood (routine x 2)     Status: None (Preliminary result)   Collection Time: 10/10/20 10:57 PM   Specimen: BLOOD RIGHT HAND  Result Value Ref Range   Specimen Description BLOOD RIGHT HAND    Special Requests      BOTTLES DRAWN AEROBIC AND ANAEROBIC Blood Culture results may not be optimal due to an inadequate volume of blood received in culture bottles   Culture      NO GROWTH < 12 HOURS Performed at [facility] [address] [place], [place] [address]    Report Status PENDING   CBG monitoring, ED     Status: None   Collection Time: 10/10/20 11:07 PM  Result Value Ref Range   Glucose-Capillary 97 70 - 99 mg/dL    Comment: Glucose reference range applies only to samples taken after fasting for at least 8 hours.  Urinalysis, Routine w reflex microscopic     Status: Abnormal   Collection Time: 10/10/20 11:20 PM  Result Value Ref Range   Color, Urine YELLOW YELLOW   APPearance CLEAR CLEAR   Specific Gravity, Urine 1.016 1.005 - 1.030   pH 5.0 5.0 - 8.0   Glucose, UA 50 (A) NEGATIVE mg/dL   Hgb urine dipstick SMALL (A) NEGATIVE   Bilirubin Urine NEGATIVE NEGATIVE   Ketones, ur NEGATIVE NEGATIVE mg/dL   Protein, ur 100 (A) NEGATIVE mg/dL   Nitrite NEGATIVE NEGATIVE   Leukocytes,Ua NEGATIVE NEGATIVE   RBC / HPF 0-5 0 - 5 RBC/hpf   WBC, UA 0-5 0 - 5 WBC/hpf   Bacteria, UA RARE (A) NONE SEEN   Squamous Epithelial / LPF 0-5 0 - 5   Mucus PRESENT    Hyaline Casts, UA PRESENT     Comment: Performed at [facility], [address] [place], [place] [address]  Culture, blood (routine x 2)     Status: None (Preliminary result)   Collection Time: 10/10/20 11:20 PM   Specimen: BLOOD  Result Value Ref Range   Specimen Description BLOOD SITE NOT SPECIFIED    Special Requests      BOTTLES DRAWN AEROBIC AND ANAEROBIC Blood Culture adequate volume   Culture      NO GROWTH < 12 HOURS Performed at [facility], [place] [address]  [place],  [address]    Report Status PENDING   I-Stat arterial blood gas, ED     Status: Abnormal   Collection Time: 10/10/20 11:54 PM  Result Value Ref Range   pH, Arterial 7.360 7.350 - 7.450  pCO2 arterial 38.1 32.0 - 48.0 mmHg   pO2, Arterial 55 (L) 83.0 - 108.0 mmHg   Bicarbonate 21.6 20.0 - 28.0 mmol/L   TCO2 23 22 - 32 mmol/L   O2 Saturation 87.0 %   Acid-base deficit 4.0 (H) 0.0 - 2.0 mmol/L   Sodium 140 135 - 145 mmol/L   Potassium 4.2 3.5 - 5.1 mmol/L   Calcium, Ion 1.10 (L) 1.15 - 1.40 mmol/L   HCT 35.0 (L) 39.0 - 52.0 %   Hemoglobin 11.9 (L) 13.0 - 17.0 g/dL   Collection site Radial    Drawn by RT    Sample type ARTERIAL   Lactic acid, plasma     Status: None   Collection Time: 10/11/20  1:40 AM  Result Value Ref Range   Lactic Acid, Venous 1.3 0.5 - 1.9 mmol/L    Comment: Performed at [facility], [address], [place], [place] [address]  Troponin I (High Sensitivity)     Status: Abnormal   Collection Time: 10/11/20  1:40 AM  Result Value Ref Range   Troponin I (High Sensitivity) 5,019 (HH) <18 ng/L    Comment: CRITICAL VALUE NOTED.  VALUE IS CONSISTENT WITH PREVIOUSLY REPORTED AND CALLED VALUE. (NOTE) Elevated high sensitivity troponin I (hsTnI) values and significant  changes across serial measurements may suggest ACS but many other  chronic and acute conditions are known to elevate hsTnI results.  Refer to the Links section for chest pain algorithms and additional  guidance. Performed at [facility], [place] [address], [place], [place] [address]   HIV Antibody (routine testing w rflx)     Status: None   Collection Time: 10/11/20  2:07 AM  Result Value Ref Range   HIV Screen 4th Generation wRfx Non Reactive Non Reactive    Comment: Performed at [facility], [place] [address] [place], [place] [address]  CBC     Status: Abnormal   Collection Time: 10/11/20  2:07 AM  Result Value Ref Range   WBC 16.5 (H) 4.0 - 10.5 K/uL   RBC 4.47 4.22  - 5.81 MIL/uL   Hemoglobin 12.4 (L) 13.0 - 17.0 g/dL   HCT 39.0 39.0 - 52.0 %   MCV 87.2 80.0 - 100.0 fL   MCH 27.7 26.0 - 34.0 pg   MCHC 31.8 30.0 - 36.0 g/dL   RDW 14.8 11.5 - 15.5 %   Platelets 272 150 - 400 K/uL   nRBC 0.0 0.0 - 0.2 %    Comment: Performed at [facility], [place] [address], [place], [place] [address]  Creatinine, serum     Status: Abnormal   Collection Time: 10/11/20  2:07 AM  Result Value Ref Range   Creatinine, Ser 1.27 (H) 0.61 - 1.24 mg/dL   GFR, Estimated >60 >60 mL/min    Comment: (NOTE) Calculated using the CKD-EPI Creatinine Equation (2021) Performed at [facility] [address], [place], [place] [address]   Procalcitonin     Status: None   Collection Time: 10/11/20  2:07 AM  Result Value Ref Range   Procalcitonin 0.58 ng/mL    Comment:        Interpretation: PCT > 0.5 ng/mL and <= 2 ng/mL: Systemic infection (sepsis) is possible, but other conditions are known to elevate PCT as well. (NOTE)       Sepsis PCT Algorithm           Lower Respiratory Tract  Infection PCT Algorithm    ----------------------------     ----------------------------         PCT < 0.25 ng/mL                PCT < 0.10 ng/mL          Strongly encourage             Strongly discourage   discontinuation of antibiotics    initiation of antibiotics    ----------------------------     -----------------------------       PCT 0.25 - 0.50 ng/mL            PCT 0.10 - 0.25 ng/mL               OR       >80% decrease in PCT            Discourage initiation of                                            antibiotics      Encourage discontinuation           of antibiotics    ----------------------------     -----------------------------         PCT >= 0.50 ng/mL              PCT 0.26 - 0.50 ng/mL                AND       <80% decrease in PCT             Encourage initiation of                                             antibiotics        Encourage continuation           of antibiotics    ----------------------------     -----------------------------        PCT >= 0.50 ng/mL                  PCT > 0.50 ng/mL               AND         increase in PCT                  Strongly encourage                                      initiation of antibiotics    Strongly encourage escalation           of antibiotics                                     -----------------------------                                           PCT <= 0.25 ng/mL  OR                                        > 80% decrease in PCT                                      Discontinue / Do not initiate                                             antibiotics  Performed at [facility], [place] [address], [place], [place] [address]   Protime-INR     Status: None   Collection Time: 10/11/20  2:07 AM  Result Value Ref Range   Prothrombin Time 12.9 11.4 - 15.2 seconds   INR 1.0 0.8 - 1.2    Comment: (NOTE) INR goal varies based on device and disease states. Performed at [facility], [place] [address] [place], [place] [address]   Lactic acid, plasma     Status: None   Collection Time: 10/11/20  2:08 AM  Result Value Ref Range   Lactic Acid, Venous 1.3 0.5 - 1.9 mmol/L    Comment: Performed at [facility] [address] [place], [place] [address]  Cortisol     Status: None   Collection Time: 10/11/20  2:50 AM  Result Value Ref Range   Cortisol, Plasma 28.1 ug/dL    Comment: (NOTE) AM    6.7 - 22.6 ug/dL PM   <10.0       ug/dL Performed at [facility] [address], [place], [place] [address]   CBC     Status: Abnormal   Collection Time: 10/11/20  5:00 AM  Result Value Ref Range   WBC 16.0 (H) 4.0 - 10.5 K/uL   RBC 4.44 4.22 - 5.81 MIL/uL   Hemoglobin 12.3 (L) 13.0 - 17.0 g/dL   HCT 38.2 (L) 39.0 - 52.0 %   MCV 86.0 80.0 - 100.0 fL   MCH 27.7 26.0 - 34.0 pg   MCHC 32.2 30.0 -  36.0 g/dL   RDW 14.6 11.5 - 15.5 %   Platelets 313 150 - 400 K/uL   nRBC 0.0 0.0 - 0.2 %    Comment: Performed at [facility], [place] [address], [place], [place] [address]  Basic metabolic panel     Status: Abnormal   Collection Time: 10/11/20  5:00 AM  Result Value Ref Range   Sodium 137 135 - 145 mmol/L   Potassium 4.6 3.5 - 5.1 mmol/L   Chloride 105 98 - 111 mmol/L   CO2 19 (L) 22 - 32 mmol/L   Glucose, Bld 132 (H) 70 - 99 mg/dL    Comment: Glucose reference range applies only to samples taken after fasting for at least 8 hours.   BUN 16 8 - 23 mg/dL   Creatinine, Ser 1.24 0.61 - 1.24 mg/dL   Calcium 8.3 (L) 8.9 - 10.3 mg/dL   GFR, Estimated >60 >60 mL/min    Comment: (NOTE) Calculated using the CKD-EPI Creatinine Equation (2021)    Anion gap 13 5 - 15    Comment: Performed at [facility]  [address] [place], [place] [address]  Magnesium     Status: None   Collection Time: 10/11/20  5:00 AM  Result Value Ref Range   Magnesium 1.9 1.7 - 2.4 mg/dL    Comment: Performed at [facility], [place] [address] [place], [place] [address]  Phosphorus     Status: None   Collection Time: 10/11/20  5:00 AM  Result Value Ref Range   Phosphorus 3.8 2.5 - 4.6 mg/dL    Comment: Performed at [facility] [address], [place], [place] [address]  Lactic acid, plasma     Status: None   Collection Time: 10/11/20  5:08 AM  Result Value Ref Range   Lactic Acid, Venous 1.1 0.5 - 1.9 mmol/L    Comment: Performed at [facility] [address] [place], [place] [address]  .Cooxemetry Panel (carboxy, met, total hgb, O2 sat)     Status: Abnormal   Collection Time: 10/11/20  8:16 AM  Result Value Ref Range   Total hemoglobin 12.4 12.0 - 16.0 g/dL   O2 Saturation 77.8 %   Carboxyhemoglobin 0.4 (L) 0.5 - 1.5 %   Methemoglobin 1.3 0.0 - 1.5 %    Comment: Performed at [facility], [place] [address] [place], [place] [address]  Heparin level (unfractionated)      Status: Abnormal   Collection Time: 10/11/20 11:20 AM  Result Value Ref Range   Heparin Unfractionated <0.10 (L) 0.30 - 0.70 IU/mL    Comment: (NOTE) If heparin results are below expected values, and patient dosage has  been confirmed, suggest follow up testing of antithrombin III levels. Performed at [facility], [place] [address] [place], [place] [address]   Troponin I (High Sensitivity)     Status: Abnormal   Collection Time: 10/11/20 11:20 AM  Result Value Ref Range   Troponin I (High Sensitivity) 25,844 (HH) <18 ng/L    Comment: CRITICAL VALUE NOTED.  VALUE IS CONSISTENT WITH PREVIOUSLY REPORTED AND CALLED VALUE. (NOTE) Elevated high sensitivity troponin I (hsTnI) values and significant  changes across serial measurements may suggest ACS but many other  chronic and acute conditions are known to elevate hsTnI results.  Refer to the Links section for chest pain algorithms and additional  guidance. Performed at [facility], [place] [address] [place], [place] [address]   .Cooxemetry Panel (carboxy, met, total hgb, O2 sat)     Status: Abnormal   Collection Time: 10/11/20  1:46 PM  Result Value Ref Range   Total hemoglobin 10.8 (L) 12.0 - 16.0 g/dL   O2 Saturation 69.1 %   Carboxyhemoglobin 0.6 0.5 - 1.5 %   Methemoglobin 1.4 0.0 - 1.5 %    Comment: Performed at [facility] [address], [place], [place] [address]  Heparin level (unfractionated)     Status: Abnormal   Collection Time: 10/11/20  5:37 PM  Result Value Ref Range   Heparin Unfractionated <0.10 (L) 0.30 - 0.70 IU/mL    Comment: (NOTE) If heparin results are below expected values, and patient dosage has  been confirmed, suggest follow up testing of antithrombin III levels. Performed at [facility], [place] [address] [place], [place] [address]   Troponin I (High Sensitivity)     Status: Abnormal   Collection Time: 10/11/20  5:37 PM  Result Value Ref Range   Troponin I (High Sensitivity)  22,131 (HH) <18 ng/L    Comment: CRITICAL VALUE  NOTED.  VALUE IS CONSISTENT WITH PREVIOUSLY REPORTED AND CALLED VALUE. (NOTE) Elevated high sensitivity troponin I (hsTnI) values and significant  changes across serial measurements may suggest ACS but many other  chronic and acute conditions are known to elevate hsTnI results.  Refer to the Links section for chest pain algorithms and additional  guidance. Performed at [facility], [place] [address], [place], [place] [address]   MRSA PCR Screening     Status: None   Collection Time: 10/11/20  8:00 PM   Specimen: Nasopharyngeal  Result Value Ref Range   MRSA by PCR NEGATIVE NEGATIVE    Comment:        The GeneXpert MRSA Assay (FDA approved for NASAL specimens only), is one component of a comprehensive MRSA colonization surveillance program. It is not intended to diagnose MRSA infection nor to guide or monitor treatment for MRSA infections. Performed at [facility], [place] [address] [place], [place] [address]   .Cooxemetry Panel (carboxy, met, total hgb, O2 sat)     Status: Abnormal   Collection Time: 10/12/20  3:55 AM  Result Value Ref Range   Total hemoglobin 10.8 (L) 12.0 - 16.0 g/dL   O2 Saturation 65.1 %   Carboxyhemoglobin 0.9 0.5 - 1.5 %   Methemoglobin 1.5 0.0 - 1.5 %    Comment: Performed at [facility] [address] [place], [place] [address]  CBC     Status: Abnormal   Collection Time: 10/12/20  4:11 AM  Result Value Ref Range   WBC 14.4 (H) 4.0 - 10.5 K/uL   RBC 3.78 (L) 4.22 - 5.81 MIL/uL   Hemoglobin 10.3 (L) 13.0 - 17.0 g/dL   HCT 33.2 (L) 39.0 - 52.0 %   MCV 87.8 80.0 - 100.0 fL   MCH 27.2 26.0 - 34.0 pg   MCHC 31.0 30.0 - 36.0 g/dL   RDW 14.8 11.5 - 15.5 %   Platelets 213 150 - 400 K/uL   nRBC 0.0 0.0 - 0.2 %    Comment: Performed at [facility], [place] [address], [place], [place] [address]  Basic metabolic panel     Status: Abnormal   Collection Time: 10/12/20  4:11 AM  Result  Value Ref Range   Sodium 136 135 - 145 mmol/L   Potassium 3.5 3.5 - 5.1 mmol/L   Chloride 102 98 - 111 mmol/L   CO2 25 22 - 32 mmol/L   Glucose, Bld 107 (H) 70 - 99 mg/dL    Comment: Glucose reference range applies only to samples taken after fasting for at least 8 hours.   BUN 11 8 - 23 mg/dL   Creatinine, Ser 1.04 0.61 - 1.24 mg/dL   Calcium 8.5 (L) 8.9 - 10.3 mg/dL   GFR, Estimated >60 >60 mL/min    Comment: (NOTE) Calculated using the CKD-EPI Creatinine Equation (2021)    Anion gap 9 5 - 15    Comment: Performed at [facility] [address], [place], [place] [address]  Phosphorus     Status: Abnormal   Collection Time: 10/12/20  4:11 AM  Result Value Ref Range   Phosphorus 2.2 (L) 2.5 - 4.6 mg/dL    Comment: Performed at [facility] [address] [place],  [address]  Magnesium     Status: None   Collection Time: 10/12/20  4:11 AM  Result Value Ref Range   Magnesium 1.8 1.7 - 2.4 mg/dL  Comment: Performed at [facility], [place] [address] [place], [place] [address]  Heparin level (unfractionated)     Status: None   Collection Time: 10/12/20  4:11 AM  Result Value Ref Range   Heparin Unfractionated 0.44 0.30 - 0.70 IU/mL    Comment: (NOTE) If heparin results are below expected values, and patient dosage has  been confirmed, suggest follow up testing of antithrombin III levels. Performed at [facility], [place] [address] [place], [place] [address]     DG Chest 1 View  Result Date: 10/11/2020 CLINICAL DATA:  Central catheter placement EXAM: CHEST  1 VIEW COMPARISON:  October 11, 2020 study obtained earlier in the day FINDINGS: Central catheter tip is in the superior vena cava. No pneumothorax. Airspace opacity in the perihilar regions bilaterally is slightly less compared to earlier in the day. No new opacity evident. Heart size and pulmonary vascular normal. No adenopathy. Old healed fracture lateral left clavicle noted. IMPRESSION:  Central catheter tip in superior vena cava without pneumothorax. Slightly less airspace opacity bilaterally compared to earlier in the day. No new opacity. Stable cardiac silhouette. Electronically Signed   By: Lowella Grip III M.D.   On: 10/11/2020 12:03   CARDIAC CATHETERIZATION  Result Date: 10/12/2020  Ost Cx to Prox Cx lesion is 100% stenosed.  Ramus lesion is 90% stenosed.  Lat Ramus lesion is 70% stenosed.  Prox LAD to Mid LAD lesion is 85% stenosed.  Mid LAD lesion is 85% stenosed.  Mid RCA lesion is 50% stenosed.  Prox RCA-1 lesion is 50% stenosed.  Prox RCA-2 lesion is 35% stenosed.  RPDA lesion is 80% stenosed.  Severe multivessel coronary calcification and coronary obstructive disease with severe calcification with stenoses of 85% in the proximal and mid LAD; 90% near ostial ramus intermediate stenosis with 70% stenosis in the mid vessel, total occlusion of the proximal circumflex with left to left collaterals; and a large dominant diffusely calcified RCA with moderate luminal irregularity with narrowings of 50%, 30% and 50% in the proximal to mid segment with 80% ostial PDA stenosis. Mild pulmonary venous hypertension with mean PA pressure at 31 mmHg. Echo documentation of EF at 35%. RECOMMENDATION: Surgical consultation for consideration of CABG revascularization surgery.  Aggressive lipid-lowering therapy.  Smoking cessation is essential.   DG Chest Port 1 View  Result Date: 10/12/2020 CLINICAL DATA:  Respiratory failure. EXAM: PORTABLE CHEST 1 VIEW COMPARISON:  10/11/2020 FINDINGS: A left jugular catheter terminates over the mid upper SVC, unchanged. The cardiac silhouette is upper limits of normal in size. Aortic atherosclerosis is noted. Right greater than left perihilar and basilar airspace opacities have not significantly changed. No sizable pleural effusion is identified although the right lateral costophrenic angle was incompletely imaged. No pneumothorax is identified.  IMPRESSION: Unchanged right greater than left lung airspace opacities which may reflect edema or pneumonia. Electronically Signed   By: Logan Bores M.D.   On: 10/12/2020 06:47   DG Chest Port 1 View  Result Date: 10/11/2020 CLINICAL DATA:  Hypoxia EXAM: PORTABLE CHEST 1 VIEW COMPARISON:  12/12/2019 FINDINGS: Bilateral perihilar airspace opacities are again noted similar prior study. Heart is mildly enlarged. No effusions or pneumothorax. IMPRESSION: Stable bilateral perihilar airspace disease. Electronically Signed   By: Rolm Baptise M.D.   On: 10/11/2020 02:26   DG Chest Portable 1 View  Result Date: 10/10/2020 CLINICAL DATA:  Weakness, atrial fibrillation EXAM: PORTABLE CHEST 1 VIEW COMPARISON:  02/02/2015 FINDINGS: Bilateral perihilar airspace disease. Mild  cardiomegaly. No effusions. No acute bony abnormality. IMPRESSION: Bilateral perihilar opacities, likely edema/CHF. Electronically Signed   By: Charlett Nose M.D.   On: 10/10/2020 21:53   ECHOCARDIOGRAM COMPLETE  Result Date: 10/11/2020    ECHOCARDIOGRAM REPORT   Patient Name:   LEXX MONTE [facility] Date of Exam: 10/11/2020 Medical Rec #:  [id number]      Height:       68.0 in Accession #:    [id number]     Weight:       149.9 lb Date of Birth:  Aug 12, 1961      BSA:          1.808 m Patient Age:    59 years       BP:           93/74 mmHg Patient Gender: M              HR:           72 bpm. Exam Location:  Inpatient Procedure: 2D Echo Indications:    acute diastolic chf 428.31  History:        Patient has prior history of Echocardiogram examinations, most                 recent 09/28/2019. Pneumonia; Signs/Symptoms:elevated troponin.  Sonographer:    Delcie Roch Referring Phys: [id number] Clarene Critchley HOFFMAN IMPRESSIONS  1. Left ventricular ejection fraction, by estimation, is 35%. The left ventricle has moderately decreased function. The left ventricle demonstrates regional wall motion abnormalities with basal to mid inferolateral and anterolateral  akinesis. There is mild left ventricular hypertrophy. Left ventricular diastolic parameters are consistent with Grade II diastolic dysfunction (pseudonormalization).  2. Right ventricular systolic function is normal. The right ventricular size is normal. There is mildly elevated pulmonary artery systolic pressure. The estimated right ventricular systolic pressure is 39.2 mmHg.  3. Left atrial size was mildly dilated.  4. The mitral valve is normal in structure. Mild mitral valve regurgitation. No evidence of mitral stenosis.  5. Tricuspid valve regurgitation is moderate.  6. The aortic valve is tricuspid. Aortic valve regurgitation is not visualized. Mild aortic valve sclerosis is present, with no evidence of aortic valve stenosis.  7. The inferior vena cava is normal in size with greater than 50% respiratory variability, suggesting right atrial pressure of 3 mmHg. FINDINGS  Left Ventricle: Left ventricular ejection fraction, by estimation, is 35%. The left ventricle has moderately decreased function. The left ventricle demonstrates regional wall motion abnormalities. The left ventricular internal cavity size was normal in size. There is mild left ventricular hypertrophy. Left ventricular diastolic parameters are consistent with Grade II diastolic dysfunction (pseudonormalization). Right Ventricle: The right ventricular size is normal. No increase in right ventricular wall thickness. Right ventricular systolic function is normal. There is mildly elevated pulmonary artery systolic pressure. The tricuspid regurgitant velocity is 3.01  m/s, and with an assumed right atrial pressure of 3 mmHg, the estimated right ventricular systolic pressure is 39.2 mmHg. Left Atrium: Left atrial size was mildly dilated. Right Atrium: Right atrial size was normal in size. Pericardium: There is no evidence of pericardial effusion. Mitral Valve: The mitral valve is normal in structure. Mild mitral annular calcification. Mild mitral valve  regurgitation. No evidence of mitral valve stenosis. Tricuspid Valve: The tricuspid valve is normal in structure. Tricuspid valve regurgitation is moderate. Aortic Valve: The aortic valve is tricuspid. Aortic valve regurgitation is not visualized. Mild aortic valve sclerosis is present, with no evidence of aortic valve stenosis.  Pulmonic Valve: The pulmonic valve was normal in structure. Pulmonic valve regurgitation is not visualized. Aorta: The aortic root is normal in size and structure. Venous: The inferior vena cava is normal in size with greater than 50% respiratory variability, suggesting right atrial pressure of 3 mmHg. IAS/Shunts: No atrial level shunt detected by color flow Doppler.  LEFT VENTRICLE PLAX 2D LVIDd:         4.80 cm  Diastology LVIDs:         4.20 cm  LV e' medial:    5.44 cm/s LV PW:         1.40 cm  LV E/e' medial:  16.6 LV IVS:        1.30 cm  LV e' lateral:   5.98 cm/s LVOT diam:     2.00 cm  LV E/e' lateral: 15.1 LV SV:         42 LV SV Index:   23 LVOT Area:     3.14 cm  RIGHT VENTRICLE             IVC RV S prime:     11.40 cm/s  IVC diam: 1.20 cm TAPSE (M-mode): 2.1 cm LEFT ATRIUM             Index       RIGHT ATRIUM           Index LA diam:        4.10 cm 2.27 cm/m  RA Area:     13.50 cm LA Vol (A2C):   80.3 ml 44.41 ml/m RA Volume:   33.20 ml  18.36 ml/m LA Vol (A4C):   55.7 ml 30.81 ml/m LA Biplane Vol: 68.6 ml 37.94 ml/m  AORTIC VALVE LVOT Vmax:   75.70 cm/s LVOT Vmean:  45.200 cm/s LVOT VTI:    0.133 m  AORTA Ao Root diam: 3.30 cm Ao Asc diam:  3.40 cm MITRAL VALVE               TRICUSPID VALVE MV Area (PHT): 3.48 cm    TR Peak grad:   36.2 mmHg MV Decel Time: 218 msec    TR Vmax:        301.00 cm/s MV E velocity: 90.40 cm/s MV A velocity: 40.70 cm/s  SHUNTS MV E/A ratio:  2.22        Systemic VTI:  0.13 m                            Systemic Diam: 2.00 cm Loralie Champagne MD Electronically signed by Loralie Champagne MD Signature Date/Time: 10/11/2020/1:56:24 PM    Final      Review of Systems  Constitutional: Positive for activity change and fatigue. Negative for diaphoresis and fever.  HENT:       Has not seen a dentist in years  Eyes: Negative.   Respiratory: Positive for shortness of breath. Negative for chest tightness.   Cardiovascular: Positive for palpitations. Negative for chest pain and leg swelling.  Gastrointestinal: Negative.   Endocrine: Negative.   Genitourinary: Negative.   Musculoskeletal: Positive for arthralgias.  Allergic/Immunologic: Negative.   Neurological: Negative for dizziness, syncope, weakness and light-headedness.  Hematological: Negative.   Psychiatric/Behavioral: Negative.    Blood pressure 105/78, pulse 81, temperature 97.7 F (36.5 C), temperature source Oral, resp. rate 15, weight 68 kg, SpO2 97 %. Physical Exam Constitutional:      Appearance: Normal appearance. He is normal weight.  HENT:  Head: Normocephalic and atraumatic.     Mouth/Throat:     Comments: Very poor dentition with most of his teeth broken off or missing Eyes:     Extraocular Movements: Extraocular movements intact.     Conjunctiva/sclera: Conjunctivae normal.     Pupils: Pupils are equal, round, and reactive to light.  Cardiovascular:     Rate and Rhythm: Normal rate and regular rhythm.     Heart sounds: No murmur heard.     Comments: Pedal pulses not palpable Pulmonary:     Effort: Pulmonary effort is normal.     Breath sounds: Normal breath sounds.  Abdominal:     General: Abdomen is flat. Bowel sounds are normal. There is no distension.     Palpations: Abdomen is soft.     Tenderness: There is no abdominal tenderness.  Musculoskeletal:        General: No swelling. Normal range of motion.     Cervical back: Normal range of motion and neck supple.  Skin:    General: Skin is warm and dry.  Neurological:     General: No focal deficit present.     Mental Status: He is alert and oriented to person, place, and time.  Psychiatric:         Mood and Affect: Mood normal.        Behavior: Behavior normal.    ECHOCARDIOGRAM REPORT       Patient Name:  Raymond Carney Date of Exam: 10/11/2020  Medical Rec #: [id number]   Height:    68.0 in  Accession #:  [id number]   Weight:    149.9 lb  Date of Birth: 03-04-1961   BSA:     1.808 m  Patient Age:  59 years    BP:      93/74 mmHg  Patient Gender: M       HR:      72 bpm.  Exam Location: Inpatient   Procedure: 2D Echo   Indications:  acute diastolic chf 428.31    History:    Patient has prior history of Echocardiogram examinations,  most         recent 09/28/2019. Pneumonia; Signs/Symptoms:elevated  troponin.    Sonographer:  Delcie Roch  Referring Phys: [id number] Clarene Critchley HOFFMAN   IMPRESSIONS    1. Left ventricular ejection fraction, by estimation, is 35%. The left  ventricle has moderately decreased function. The left ventricle  demonstrates regional wall motion abnormalities with basal to mid  inferolateral and anterolateral akinesis. There is  mild left ventricular hypertrophy. Left ventricular diastolic parameters  are consistent with Grade II diastolic dysfunction (pseudonormalization).  2. Right ventricular systolic function is normal. The right ventricular  size is normal. There is mildly elevated pulmonary artery systolic  pressure. The estimated right ventricular systolic pressure is 39.2 mmHg.  3. Left atrial size was mildly dilated.  4. The mitral valve is normal in structure. Mild mitral valve  regurgitation. No evidence of mitral stenosis.  5. Tricuspid valve regurgitation is moderate.  6. The aortic valve is tricuspid. Aortic valve regurgitation is not  visualized. Mild aortic valve sclerosis is present, with no evidence of  aortic valve stenosis.  7. The inferior vena cava is normal in size with greater than 50%  respiratory variability, suggesting right atrial pressure  of 3 mmHg.   FINDINGS  Left Ventricle: Left ventricular ejection fraction, by estimation, is  35%. The left ventricle has moderately decreased function. The left  ventricle demonstrates  regional wall motion abnormalities. The left  ventricular internal cavity size was normal in  size. There is mild left ventricular hypertrophy. Left ventricular  diastolic parameters are consistent with Grade II diastolic dysfunction  (pseudonormalization).   Right Ventricle: The right ventricular size is normal. No increase in  right ventricular wall thickness. Right ventricular systolic function is  normal. There is mildly elevated pulmonary artery systolic pressure. The  tricuspid regurgitant velocity is 3.01  m/s, and with an assumed right atrial pressure of 3 mmHg, the estimated  right ventricular systolic pressure is 96.2 mmHg.   Left Atrium: Left atrial size was mildly dilated.   Right Atrium: Right atrial size was normal in size.   Pericardium: There is no evidence of pericardial effusion.   Mitral Valve: The mitral valve is normal in structure. Mild mitral annular  calcification. Mild mitral valve regurgitation. No evidence of mitral  valve stenosis.   Tricuspid Valve: The tricuspid valve is normal in structure. Tricuspid  valve regurgitation is moderate.   Aortic Valve: The aortic valve is tricuspid. Aortic valve regurgitation is  not visualized. Mild aortic valve sclerosis is present, with no evidence  of aortic valve stenosis.   Pulmonic Valve: The pulmonic valve was normal in structure. Pulmonic valve  regurgitation is not visualized.   Aorta: The aortic root is normal in size and structure.   Venous: The inferior vena cava is normal in size with greater than 50%  respiratory variability, suggesting right atrial pressure of 3 mmHg.   IAS/Shunts: No atrial level shunt detected by color flow Doppler.     LEFT VENTRICLE  PLAX 2D  LVIDd:     4.80 cm Diastology  LVIDs:      4.20 cm LV e' medial:  5.44 cm/s  LV PW:     1.40 cm LV E/e' medial: 16.6  LV IVS:    1.30 cm LV e' lateral:  5.98 cm/s  LVOT diam:   2.00 cm LV E/e' lateral: 15.1  LV SV:     42  LV SV Index:  23  LVOT Area:   3.14 cm     RIGHT VENTRICLE       IVC  RV S prime:   11.40 cm/s IVC diam: 1.20 cm  TAPSE (M-mode): 2.1 cm   LEFT ATRIUM       Index    RIGHT ATRIUM      Index  LA diam:    4.10 cm 2.27 cm/m RA Area:   13.50 cm  LA Vol (A2C):  80.3 ml 44.41 ml/m RA Volume:  33.20 ml 18.36 ml/m  LA Vol (A4C):  55.7 ml 30.81 ml/m  LA Biplane Vol: 68.6 ml 37.94 ml/m  AORTIC VALVE  LVOT Vmax:  75.70 cm/s  LVOT Vmean: 45.200 cm/s  LVOT VTI:  0.133 m    AORTA  Ao Root diam: 3.30 cm  Ao Asc diam: 3.40 cm   MITRAL VALVE        TRICUSPID VALVE  MV Area (PHT): 3.48 cm  TR Peak grad:  36.2 mmHg  MV Decel Time: 218 msec  TR Vmax:    301.00 cm/s  MV E velocity: 90.40 cm/s  MV A velocity: 40.70 cm/s SHUNTS  MV E/A ratio: 2.22    Systemic VTI: 0.13 m               Systemic Diam: 2.00 cm   Loralie Champagne MD  Electronically signed by Loralie Champagne MD  Signature Date/Time: 10/11/2020/1:56:24 PM  Physicians  Panel Physicians Referring Physician Case Authorizing Physician  Troy Sine, MD (Primary)      Procedures  RIGHT/LEFT HEART CATH AND CORONARY ANGIOGRAPHY   Conclusion    Ost Cx to Prox Cx lesion is 100% stenosed.  Ramus lesion is 90% stenosed.  Lat Ramus lesion is 70% stenosed.  Prox LAD to Mid LAD lesion is 85% stenosed.  Mid LAD lesion is 85% stenosed.  Mid RCA lesion is 50% stenosed.  Prox RCA-1 lesion is 50% stenosed.  Prox RCA-2 lesion is 35% stenosed.  RPDA lesion is 80% stenosed.   Severe multivessel coronary calcification and coronary obstructive disease with severe calcification with stenoses of 85% in the proximal and mid LAD; 90% near  ostial ramus intermediate stenosis with 70% stenosis in the mid vessel, total occlusion of the proximal circumflex with left to left collaterals; and a large dominant diffusely calcified RCA with moderate luminal irregularity with narrowings of 50%, 30% and 50% in the proximal to mid segment with 80% ostial PDA stenosis.  Mild pulmonary venous hypertension with mean PA pressure at 31 mmHg.  Echo documentation of EF at 35%.  RECOMMENDATION: Surgical consultation for consideration of CABG revascularization surgery.  Aggressive lipid-lowering therapy.  Smoking cessation is essential.    Recommendations  Antiplatelet/Anticoag Recommend Aspirin $RemoveBeforeDEI'81mg'[id number]$  daily for moderate CAD.   Indications  Elevated troponin [R77.8 (ICD-10-CM)]  Ischemic cardiomyopathy [I25.5 (ICD-10-CM)]   Procedural Details  Technical Details Mr. Kareem Cathey is a 59 year old gentleman who has a 40-year history of tobacco use, a history of PVD with right subclavian stenosis, history of TIA/CVA as well as CAD.  He had undergone prior cardiac catheterization in December 2020 by Dr. Tamala Julian which revealed severe multivessel coronary calcification and significant coronary obstructive disease.  At the time the patient had a gastric ulcer requiring several units of packed red blood cell transfusion of transfusion.  Patient has been on medical therapy.  He was admitted with increasing fatigue and malaise.  Upon arrival he was in atrial fibrillation with RVR.  He became dyspneic, hypotensive, and hypoxic.  He required inotropic support with Levophed and was treated with amiodarone.  An echo Doppler study showed an EF of 35% which is new since his cath 1 year ago.  Troponins are positive at 25, 844.  He is referred for right and left heart cardiac catheterization.  On arrival to the catheterization laboratory, the patient was chest pain-free.  He did not have any significant palpable radial pulse.  As result the decision was made to use  the right femoral approach for his procedure.  His right femoral artery was punctured anteriorly and a 5 French sheath was inserted without difficulty.  His right femoral vein was punctured anteriorly and a venous sheath was inserted.  Swan-Ganz catheter was advanced through the femoral sheath and pressures were obtained in the RA, RV, PA, and pulmonary capillary wedge positions.  O2 saturation was obtained in the pulmonary artery.  Diagnostic catheterization was done with Judkins 4 left and right catheters.  Oxygen saturation was obtained in the central aorta.  Right catheter was used for left ventricular pressure recording.  Since the patient had an echo Doppler done yesterday, left ventriculography was not done.  All catheters were removed from the patient.  Hemostasis was obtained by direct manual pressure.  He left the catheterization laboratory chest pain-free with stable hemodynamics. Estimated blood loss <50 mL.   During this procedure medications were administered to achieve and maintain moderate conscious sedation  while the patient's heart rate, blood pressure, and oxygen saturation were continuously monitored and I was present face-to-face 100% of this time.   Medications (Filter: Administrations occurring from 0910 to 1054 on 10/12/20) (important) Continuous medications are totaled by the amount administered until 10/12/20 1054.    Heparin (Porcine) in NaCl 1000-0.9 UT/500ML-% SOLN (mL) Total volume:  1,000 mL  Date/Time Rate/Dose/Volume Action   10/12/20 0924 500 mL Given   0924 500 mL Given    fentaNYL (SUBLIMAZE) injection (mcg) Total dose:  25 mcg  Date/Time Rate/Dose/Volume Action   10/12/20 0934 25 mcg Given    midazolam (VERSED) injection (mg) Total dose:  1 mg  Date/Time Rate/Dose/Volume Action   10/12/20 0934 1 mg Given    lidocaine (PF) (XYLOCAINE) 1 % injection (mL) Total volume:  15 mL  Date/Time Rate/Dose/Volume Action   10/12/20 0942 15 mL Given     iohexol (OMNIPAQUE) 350 MG/ML injection (mL) Total volume:  55 mL  Date/Time Rate/Dose/Volume Action   10/12/20 1019 55 mL Given    0.9 % sodium chloride infusion (mL/hr) Total volume:  0 mL*  *Administration not included in total Date/Time Rate/Dose/Volume Action   10/12/20 0915 4.16 [vol] Infusion Verify    amiodarone (PACERONE) tablet 400 mg (mg) Total dose:  Cannot be calculated* Dosing weight:  68  *Administration dose not documented Date/Time Rate/Dose/Volume Action   10/12/20 0916 *Not included in total MAR Hold   5465 *0 mg Duplicate    aspirin EC tablet 81 mg (mg) Total dose:  Cannot be calculated* Dosing weight:  68  *Administration dose not documented Date/Time Rate/Dose/Volume Action   10/12/20 0916 *Not included in total MAR Hold    azithromycin (ZITHROMAX) 500 mg in sodium chloride 0.9 % 250 mL IVPB (mL/hr) Total dose:  Cannot be calculated*  *Administration dose not documented Date/Time Rate/Dose/Volume Action   10/12/20 0915 0 [vol] Infusion Verify   0916 *Not included in total MAR Hold    cefTRIAXone (ROCEPHIN) 2 g in sodium chloride 0.9 % 100 mL IVPB (mL/hr) Total dose:  Cannot be calculated*  *Administration dose not documented Date/Time Rate/Dose/Volume Action   10/12/20 0916 *Not included in total MAR Hold    Chlorhexidine Gluconate Cloth 2 % PADS 6 each (each) Total dose:  6 each Dosing weight:  68  Date/Time Rate/Dose/Volume Action   10/12/20 0916 *Not included in total MAR Hold   1000 6 each Given    norepinephrine (LEVOPHED) 4mg  in 246mL premix infusion (mcg/min) Total dose:  16 mcg*  *Administration not included in total Date/Time Rate/Dose/Volume Action   10/12/20 0915 *5 mcg/min - 18.75 mL/hr Infusion Verify   0916 *Not included in total MAR Hold   0929 2 mcg/min - 7.5 mL/hr Rate/Dose Change   0937  Stopped    pantoprazole (PROTONIX) EC tablet 40 mg (mg) Total dose:  Cannot be calculated* Dosing weight:   68  *Administration dose not documented Date/Time Rate/Dose/Volume Action   10/12/20 0916 *Not included in total MAR Hold   0354 *0 mg Duplicate    rosuvastatin (CRESTOR) tablet 20 mg (mg) Total dose:  Cannot be calculated* Dosing weight:  68  *Administration dose not documented Date/Time Rate/Dose/Volume Action   10/12/20 0916 *Not included in total MAR Hold   6568 *0 mg Duplicate    sodium chloride flush (NS) 0.9 % injection 3 mL (mL) Total volume:  3 mL Dosing weight:  68  Date/Time Rate/Dose/Volume Action   10/12/20 0916 *Not included in total [facility] Hold  1000 3 mL Given    heparin ADULT infusion 100 units/mL (25000 units/271mL sodium chloride 0.45%) (Units/hr) Total dose:  Cannot be calculated*  *Administration dose not documented Date/Time Rate/Dose/Volume Action   10/12/20 0912  Stopped   0912  Stopped   0915 4.35 [vol]     Sedation Time  Sedation Time Physician-1: 44 minutes 10 seconds   Contrast  Medication Name Total Dose  iohexol (OMNIPAQUE) 350 MG/ML injection 55 mL    Radiation/Fluoro  Fluoro time: 5.9 (min) DAP: 12167 (mGycm2) Cumulative Air Kerma: 208.2 (mGy)   Coronary Findings   Diagnostic Dominance: Right  Left Anterior Descending  Prox LAD to Mid LAD lesion is 85% stenosed. The lesion is calcified.  Mid LAD lesion is 85% stenosed.  Ramus Intermedius  Ramus lesion is 90% stenosed.  Lateral Ramus Intermedius  Lat Ramus lesion is 70% stenosed.  Left Circumflex  Vessel is small.  Ost Cx to Prox Cx lesion is 100% stenosed.  First Obtuse Marginal Branch  Collaterals  1st Mrg filled by collaterals from Ramus.    Right Coronary Artery  There is moderate diffuse disease throughout the vessel.  Prox RCA-1 lesion is 50% stenosed.  Prox RCA-2 lesion is 35% stenosed.  Mid RCA lesion is 50% stenosed.  Right Posterior Descending Artery  RPDA lesion is 80% stenosed.   Intervention   No interventions have been documented.  Right  Heart  Right Heart Pressures RA: A-wave 4; mean 2 RV: 39/4 PA: 43/23; mean 31 PW: A-wave 21, V wave 19; mean 19.  Initial AO: 162/72           LV: 155/15  Pullback: LV: 160/19                AO: 161/69  O2 saturation in the central aorta 94% and in the pulmonary artery 64% By the Fick method, cardiac output 5.7 L/min and cardiac index 3.2 L/min/m.  PVR: 2.1 WU   Coronary Diagrams   Diagnostic Dominance: Right    Intervention    Implants    No implant documentation for this case.   Syngo Images  Show images for CARDIAC CATHETERIZATION  Images on Long Term Storage  Show images for Venus, Ruhe to Procedure Log  Procedure Log     Hemo Data  Flowsheet Row Most Recent Value  Fick Cardiac Output 5.7 L/min  Fick Cardiac Output Index 3.16 (L/min)/BSA  RA A Wave 4 mmHg  RA V Wave 0 mmHg  RA Mean 2 mmHg  RV Systolic Pressure 39 mmHg  RV Diastolic Pressure 2 mmHg  RV EDP 4 mmHg  PA Systolic Pressure 43 mmHg  PA Diastolic Pressure 23 mmHg  PA Mean 31 mmHg  PW A Wave 21 mmHg  PW V Wave 19 mmHg  PW Mean 19 mmHg  AO Systolic Pressure 315 mmHg  AO Diastolic Pressure 72 mmHg  AO Mean 400 mmHg  LV Systolic Pressure 867 mmHg  LV Diastolic Pressure 10 mmHg  LV EDP 15 mmHg  AOp Systolic Pressure 619 mmHg  AOp Diastolic Pressure 69 mmHg  AOp Mean Pressure 509 mmHg  LVp Systolic Pressure 326 mmHg  LVp Diastolic Pressure 12 mmHg  LVp EDP Pressure 19 mmHg  QP/QS 1  TPVR Index 9.8 HRUI  TSVR Index 33.84 HRUI  PVR SVR Ratio 0.11  TPVR/TSVR Ratio 0.29     Assessment/Plan:  This 59 year old vasculopath has severe three-vessel coronary disease and severe left ventricular systolic dysfunction with ejection fraction of 50  to 55% 1 year ago which is now decreased to 35%.  His coronary disease is essentially unchanged from his prior catheterization 1 year ago.  I agree that his coronary disease is most amenable to coronary artery bypass graft surgery  but his operative risk would be very high due to his diffuse vascular disease including left CCA, ICA, and ECA occlusion with high-grade right ICA stenosis, right subclavian artery stenosis and left subclavian artery occlusion.  This would certainly put him at significantly increased risk of ischemic stroke with cardiopulmonary bypass.  He would also be at risk for renal failure with high-grade bilateral renal artery stenosis.  He is a longtime heavy smoker with ongoing smoking and likely has severe COPD complicating his vascular disease.  I think it may be best in this patient to consider limited PCI, particularly of the LAD if possible and rate control of his atrial fibrillation which likely put him into acute congestive heart failure.  He said that he was feeling fine until recently and has never had any chest pain.  If it is felt by cardiology that PCI is not an option then I would have vascular surgery see him to consider whether right carotid revascularization may be indicated at the time of CABG to decrease his risk of stroke.  I spent 60 minutes performing this consultation and > 50% of this time was spent face to face counseling and coordinating the care of this patient's severe multivessel coronary disease.  Gaye Pollack 10/12/2020, 3:34 PM

## 2020-10-12 NOTE — Progress Notes (Signed)
NAME:  Raymond Carney, MRN:  [id number], DOB:  [birth year], LOS: 1 ADMISSION DATE:  10/10/2020, CONSULTATION DATE:  12/13 REFERRING MD:  Dr. Regenia Skeeter, CHIEF COMPLAINT:  Shock   Brief History   59 year old male presented in new onset AF-RVR. Improved with volume, but he then became hypoxic and hypotensive requiring BiPAP and levophed. PCCM consulted.   History of present illness   59 year old male with PMH as below, which is significant for 40 pack year history active smoker, multivessel CAD, R subclavian artery stenosis, and GI bleed. He had admission about one year prior to this admission for hemorrhagic shock secondary to bleeding gastric ulcers. He suffered type-II MI as a result of anemia and underwent cardiac workup showing multi-vessel CAD. He was not felt to be a surgical candidate at that time due to GI bleeding. Outpatient follow-up has been uneventful.   He now presented to [facility] ED on 12/12 with complaints of weakness and malaise. Upon arrival to ED he was found to be in rapid AF with rates in the 120s. This improved with 2L IVF as part of sepsis workup, however, shortly after he became dyspneic, hypoxic, and hypotensive. He was started on BiPAP and has since been started on norepinephrine with improvement. Denies fever, chills, chest pain, productive cough.   Past Medical History   has a past medical history of Carotid stenosis, COPD (chronic obstructive pulmonary disease) ([place]), History of stroke, Hyperlipidemia, Pituitary tumor, Stenosis of right subclavian artery ([place]), and TIA (transient ischemic attack).   Significant Hospital Events   12/12 admit 12/13 off BiPAP 12/14 RHC / LHC  Consults:  Cardiology  Procedures:  L IJ CVL 12/13 >  R radial art line 12/12 > 12/13. L radial art line 12/13 >   Significant Diagnostic Tests:  POC echo 12/13 > EF 30% Echocardiogram 12/13 > EF 35%, G2DD. [place] / [place] 12/14 >   Micro Data:  COVID 12/12 > neg. Flu 12/12 > neg. Blood  12/13 >   Antimicrobials:  CTX 12/12 > Azithromycin 12/12 >  Interim history/subjective:  "I feel a lot better".  Denies dyspnea, chest pain. Remains on 5 Levo. CVP 6, Coox 65. For cath lab this AM.  Objective   Blood pressure 129/83, pulse 88, temperature 98.4 F (36.9 C), temperature source Oral, resp. rate 15, weight 68 kg, SpO2 97 %. CVP:  [4 mmHg-7 mmHg] 6 mmHg      Intake/Output Summary (Last 24 hours) at 10/12/2020 1033 Last data filed at 10/12/2020 0915 Gross per 24 hour  Intake 1456.18 ml  Output 800 ml  Net 656.18 ml   Filed Weights   10/11/20 0500 10/11/20 1923 10/12/20 0500  Weight: 68 kg 68 kg 68 kg    Examination:  General: Adult male, resting in bed watching TV, in NAD. Neuro: A&O x 3, no deficits. HEENT: [place]/AT. Sclerae anicteric. EOMI. BiPAP in place Cardiovascular: RRR, no M/R/G.  Lungs: Respirations even and unlabored.  CTA bilaterally, No W/R/R.  Abdomen: BS x 4, soft, NT/ND.  Musculoskeletal: No gross deformities, no edema.  Skin: Intact, cool, no rashes.   Assessment & Plan:   Acute hypoxemic respiratory failure: likely due to pulmonary edema, but cannot rule out CAP with bilateral infiltrates and leukocytosis.  - Continue BiPAP as needed. - SpO2 goal > 90%. - Empiric CAP coverage.  - Blood cultures pending.   Shock: septic vs cardiogenic. Favor the latter 2/2 A.fib RVR/NSTEMI.  CVP 6, Co-ox 65. Acute on  chronic HFpEF, known CAD. - Cards following, taking for RHC / LHC today.  Appreciate the assistance. - Continue levophed as needed for goal MAP > 65.  A.fib RVR - now in NSR on amio. - Continue heparin gtt. - Transition to amio 400mg  BID x 1 week then 400mg  daily x 1 week then 200mg  daily.  COPD without acute exacerbation (Active 1 ppd smoker). - PRN albuterol.   Best practice (evaluated daily)  Diet: NPO Pain/Anxiety/Delirium protocol (if indicated): NA VAP protocol (if indicated): NA DVT prophylaxis: heparin infusion GI  prophylaxis: NA Glucose control: NA Mobility: BR last date of multidisciplinary goals of care discussion Family and staff present  Summary of discussion  Follow up goals of care discussion due 12/20 Code Status: FULL Disposition: ICU   Critical care time: 30 minutes   Montey Hora, [facility] 10/12/2020, 10:33 AM

## 2020-10-12 NOTE — Progress Notes (Signed)
[place] for Heparin Indication: chest pain/ACS  Labs: Recent Labs    10/10/20 2210 10/10/20 2217 10/11/20 0140 10/11/20 0207 10/11/20 0500 10/11/20 1120 10/11/20 1737 10/12/20 0411  HGB 12.6*   < >  --  12.4* 12.3*  --   --  10.3*  HCT 39.7   < >  --  39.0 38.2*  --   --  33.2*  PLT 240  --   --  272 313  --   --  213  LABPROT  --   --   --  12.9  --   --   --   --   INR  --   --   --  1.0  --   --   --   --   HEPARINUNFRC  --   --   --   --   --  <0.10* <0.10* 0.44  CREATININE 1.34*  --   --  1.27* 1.24  --   --   --   TROPONINIHS 637*  --  5,019*  --   --  62,703* 22,131*  --    < > = values in this interval not displayed.    Assessment: 59 y.o. M presents with new onset AF with RVR which has improved. Pharmacy to start heparin for r/o ACS. Trop up to 50,093. No AC PTA.   Heparin level 0.44 units/ml  Goal of Therapy:  Heparin level 0.3-0.7 units/ml Monitor platelets by anticoagulation protocol: Yes   Plan:  Continue heparin infusion at 1150 units/h Recheck heparin level in 6h to confirm  Thanks for allowing pharmacy to be a part of this patient's care.  Excell Seltzer, PharmD Clinical Pharmacist 10/12/2020

## 2020-10-13 ENCOUNTER — Encounter (HOSPITAL_COMMUNITY): Payer: Self-pay | Admitting: Pulmonary Disease

## 2020-10-13 LAB — BASIC METABOLIC PANEL
Anion gap: 11 (ref 5–15)
BUN: 12 mg/dL (ref 8–23)
CO2: 26 mmol/L (ref 22–32)
Calcium: 8.5 mg/dL — ABNORMAL LOW (ref 8.9–10.3)
Chloride: 101 mmol/L (ref 98–111)
Creatinine, Ser: 1.02 mg/dL (ref 0.61–1.24)
GFR, Estimated: >60 mL/min (ref >60)
Glucose, Bld: 99 mg/dL (ref 70–99)
Potassium: 3.2 mmol/L — ABNORMAL LOW (ref 3.5–5.1)
Sodium: 138 mmol/L (ref 135–145)

## 2020-10-13 LAB — CBC
HCT: 31.2 % — ABNORMAL LOW (ref 39.0–52.0)
HCT: 32.7 % — ABNORMAL LOW (ref 39.0–52.0)
Hemoglobin: 9.6 g/dL — ABNORMAL LOW (ref 13.0–17.0)
Hemoglobin: 10.3 g/dL — ABNORMAL LOW (ref 13.0–17.0)
MCH: 27.1 pg (ref 26.0–34.0)
MCH: 27.3 pg (ref 26.0–34.0)
MCHC: 30.8 g/dL (ref 30.0–36.0)
MCHC: 31.5 g/dL (ref 30.0–36.0)
MCV: 88.1 fL (ref 80.0–100.0)
MCV: 86.7 fL (ref 80.0–100.0)
Platelets: 183 10*3/uL (ref 150–400)
Platelets: 194 10*3/uL (ref 150–400)
RBC: 3.54 MIL/uL — ABNORMAL LOW (ref 4.22–5.81)
RBC: 3.77 MIL/uL — ABNORMAL LOW (ref 4.22–5.81)
RDW: 14.7 % (ref 11.5–15.5)
RDW: 14.6 % (ref 11.5–15.5)
WBC: 9 10*3/uL (ref 4.0–10.5)
WBC: 9.4 10*3/uL (ref 4.0–10.5)
nRBC: 0 % (ref 0.0–0.2)
nRBC: 0 % (ref 0.0–0.2)

## 2020-10-13 LAB — COOXEMETRY PANEL
Carboxyhemoglobin: 1.3 % (ref 0.5–1.5)
Methemoglobin: 1.2 % (ref 0.0–1.5)
O2 Saturation: 53.2 %
Total hemoglobin: 10 g/dL — ABNORMAL LOW (ref 12.0–16.0)

## 2020-10-13 LAB — LIPID PANEL
Cholesterol: 149 mg/dL (ref 0–200)
HDL: 51 mg/dL (ref >40)
LDL Cholesterol: 83 mg/dL (ref 0–99)
Total CHOL/HDL Ratio: 2.9 RATIO
Triglycerides: 73 mg/dL (ref <150)
VLDL: 15 mg/dL (ref 0–40)

## 2020-10-13 LAB — HEPARIN LEVEL (UNFRACTIONATED): Heparin Unfractionated: 0.37 IU/mL (ref 0.30–0.70)

## 2020-10-13 MED ORDER — SODIUM CHLORIDE 0.9% FLUSH
3.0000 mL | Freq: Two times a day (BID) | INTRAVENOUS | Status: DC
  Administered 2020-10-13 – 2020-10-14 (×2): 3 mL via INTRAVENOUS

## 2020-10-13 MED ORDER — SODIUM CHLORIDE 0.9 % IV SOLN: INTRAVENOUS | Status: AC

## 2020-10-13 MED ORDER — HEPARIN SODIUM (PORCINE) 5000 UNIT/ML IJ SOLN: 5000.0000 [IU] | Freq: Three times a day (TID) | INTRAMUSCULAR | Status: DC

## 2020-10-13 MED ORDER — LABETALOL HCL 5 MG/ML IV SOLN: 10.0000 mg | INTRAVENOUS | Status: AC | PRN

## 2020-10-13 MED ORDER — ROSUVASTATIN CALCIUM 20 MG PO TABS
40.0000 mg | ORAL_TABLET | Freq: Every day | ORAL | Status: DC
  Administered 2020-10-13 – 2020-10-14 (×2): 40 mg via ORAL
  Filled 2020-10-13 (×2): qty 2

## 2020-10-13 MED ORDER — EZETIMIBE 10 MG PO TABS
10.0000 mg | ORAL_TABLET | Freq: Every day | ORAL | Status: DC
  Administered 2020-10-13 – 2020-10-14 (×2): 10 mg via ORAL
  Filled 2020-10-13 (×2): qty 1

## 2020-10-13 MED ORDER — SODIUM CHLORIDE 0.9 % IV SOLN: 250.0000 mL | INTRAVENOUS | Status: DC | PRN

## 2020-10-13 MED ORDER — APIXABAN 5 MG PO TABS
5.0000 mg | ORAL_TABLET | Freq: Two times a day (BID) | ORAL | Status: DC
  Administered 2020-10-13 – 2020-10-14 (×3): 5 mg via ORAL
  Filled 2020-10-13 (×3): qty 1

## 2020-10-13 MED ORDER — ACETAMINOPHEN 325 MG PO TABS: 650.0000 mg | ORAL_TABLET | ORAL | Status: DC | PRN

## 2020-10-13 MED ORDER — ONDANSETRON HCL 4 MG/2ML IJ SOLN: 4.0000 mg | Freq: Four times a day (QID) | INTRAMUSCULAR | Status: DC | PRN

## 2020-10-13 MED ORDER — ASPIRIN 81 MG PO CHEW: 81.0000 mg | CHEWABLE_TABLET | Freq: Every day | ORAL | Status: DC

## 2020-10-13 MED ORDER — SODIUM CHLORIDE 0.9% FLUSH: 3.0000 mL | INTRAVENOUS | Status: DC | PRN

## 2020-10-13 MED ORDER — HYDRALAZINE HCL 20 MG/ML IJ SOLN: 10.0000 mg | INTRAMUSCULAR | Status: AC | PRN

## 2020-10-13 MED ORDER — POTASSIUM CHLORIDE CRYS ER 20 MEQ PO TBCR
40.0000 meq | EXTENDED_RELEASE_TABLET | Freq: Once | ORAL | Status: AC
  Administered 2020-10-13: 17:00:00 40 meq via ORAL
  Filled 2020-10-13: qty 2

## 2020-10-13 MED ORDER — POTASSIUM CHLORIDE CRYS ER 20 MEQ PO TBCR
40.0000 meq | EXTENDED_RELEASE_TABLET | Freq: Once | ORAL | Status: AC
  Administered 2020-10-13: 08:00:00 40 meq via ORAL
  Filled 2020-10-13: qty 2

## 2020-10-13 NOTE — Progress Notes (Signed)
[place] for Heparin >> apixaban Indication: chest pain/ACS, afib  No Known Allergies  Patient Measurements: Height: 5\' 9"  (175.3 cm) Weight: 65.6 kg (144 lb 9.6 oz) IBW/kg (Calculated) : 70.7  Ht 68 in Wt 61 kg   Vital Signs: Temp: 98.5 F (36.9 C) (12/15 1338) Temp Source: Oral (12/15 1338) BP: 107/76 (12/15 1338) Pulse Rate: 74 (12/15 1338)  Labs: Recent Labs    10/11/20 0140 10/11/20 0207 10/11/20 0500 10/11/20 1120 10/11/20 1120 10/11/20 1737 10/12/20 0411 10/12/20 1000 10/12/20 1004 10/13/20 0500 10/13/20 1539  HGB  --  12.4* 12.3*  --   --   --  10.3*   < > 10.9* 9.6* 10.3*  HCT  --  39.0 38.2*  --   --   --  33.2*   < > 32.0* 31.2* 32.7*  PLT  --  272 313  --   --   --  213  --   --  183 194  LABPROT  --  12.9  --   --   --   --   --   --   --   --   --   INR  --  1.0  --   --   --   --   --   --   --   --   --   HEPARINUNFRC  --   --   --  <0.10*   < > <0.10* 0.44  --   --  0.37  --   CREATININE  --  1.27* 1.24  --   --   --  1.04  --   --  1.02  --   TROPONINIHS 5,019*  --   --  32,671*  --  22,131*  --   --   --   --   --    < > = values in this interval not displayed.    Estimated Creatinine Clearance: 70.6 mL/min (by C-G formula based on SCr of 1.02 mg/dL).   Medical History: Past Medical History:  Diagnosis Date  . Carotid stenosis    [place] 2458:  R 09-98; LICA occluded; prox L subcl occluded; R subcl > 50%  . COPD (chronic obstructive pulmonary disease) ([place])   . History of stroke    Echo 7/16:  EF 60-65  . Hyperlipidemia   . Pituitary tumor    s/p excision ~ 2000  . Stenosis of right subclavian artery ([place])    S/P STENT FOLLOWED BY DR. Leonie Man AND DR. [name]   . TIA (transient ischemic attack)     Assessment: 59 y.o. M presents with new onset AF with RVR which has improved. Pharmacy to start heparin for r/o ACS. Trop up to 33,825. No AC PTA.   Underwent cardiac cath finding severe multivessel CAD  - plan for CTVS eval. Heparin restarted post-cath.  Pharmacy consulted to transition heparin drip to apixaban. Patient not deemed candidate for PCI per Dr. Gardiner Rhyme. CBC stable, SCr stable 1.02. No active bleed issues reported.  Goal of Therapy:  Heparin level 0.3-0.7 units/ml Monitor platelets by anticoagulation protocol: Yes   Plan:  D/c heparin at time of 1st dose of apixaban 5mg  PO BID - communicated plan with RN Monitor CBC, s/sx bleeding   Arturo Morton, PharmD, BCPS Please check AMION for all [place] contact numbers Clinical Pharmacist 10/13/2020 4:43 PM

## 2020-10-13 NOTE — Progress Notes (Signed)
ANTICOAGULATION CONSULT NOTE   Pharmacy Consult for Heparin Indication: chest pain/ACS  No Known Allergies  Patient Measurements: Weight: 68.4 kg (150 lb 12.7 oz)  Ht 68 in Wt 61 kg   Vital Signs: Temp: 98.2 F (36.8 C) (12/15 0750) Temp Source: Oral (12/15 0750) BP: 79/61 (12/15 0800) Pulse Rate: 78 (12/15 0900)  Labs: Recent Labs    10/11/20 0140 10/11/20 0207 10/11/20 0500 10/11/20 1120 10/11/20 1120 10/11/20 1737 10/12/20 0411 10/12/20 1000 10/12/20 1004 10/13/20 0500  HGB  --  12.4* 12.3*  --   --   --  10.3* 10.9*  10.9* 10.9* 9.6*  HCT  --  39.0 38.2*  --   --   --  33.2* 32.0*  32.0* 32.0* 31.2*  PLT  --  272 313  --   --   --  213  --   --  183  LABPROT  --  12.9  --   --   --   --   --   --   --   --   INR  --  1.0  --   --   --   --   --   --   --   --   HEPARINUNFRC  --   --   --  <0.10*   < > <0.10* 0.44  --   --  0.37  CREATININE  --  1.27* 1.24  --   --   --  1.04  --   --  1.02  TROPONINIHS 5,019*  --   --  25,053*  --  22,131*  --   --   --   --    < > = values in this interval not displayed.    CrCl cannot be calculated (Unknown ideal weight.).   Medical History: Past Medical History:  Diagnosis Date  . Carotid stenosis    [place] 9767:  R 34-19; LICA occluded; prox L subcl occluded; R subcl > 50%  . COPD (chronic obstructive pulmonary disease) ([place])   . History of stroke    Echo 7/16:  EF 60-65  . Hyperlipidemia   . Pituitary tumor    s/p excision ~ 2000  . Stenosis of right subclavian artery ([place])    S/P STENT FOLLOWED BY DR. Leonie Man AND DR. [name]   . TIA (transient ischemic attack)     Medications:  See electronic med rec  Assessment: 59 y.o. M presents with new onset AF with RVR which has improved. Pharmacy to start heparin for r/o ACS. Trop up to 37,902. No AC PTA.   Underwent cardiac cath finding severe multivessel CAD - plan for CTVS eval. Plan to restart heparin infusion 8 hours after sheath removal (documented on  12/14@1024 ).   Heparin level at goal this AM (0.37), no overt bleeding or complications noted.  Goal of Therapy:  Heparin level 0.3-0.7 units/ml Monitor platelets by anticoagulation protocol: Yes   Plan:  Continue IV heparin at current rate of 1150 units/hr. Daily heparin level and CBC. F/u plans for potential PCI  [place] Crane, Roylene Reason, [facility] Clinical Pharmacist  10/13/2020 10:53 AM   [facility] pharmacy phone numbers are listed on [email or web address]

## 2020-10-13 NOTE — Progress Notes (Signed)
Discussed case with Dr Marlou Porch (patient's cardiologist), Dr Tamala Julian (interventional cardiology), and Dr Cyndia Bent (cardiac surgery).  He is not a good candidate for surgery given his diffuse vascular disease/COPD and not a good candidate for PCI given significant coronary tortuosity/calcifications. Medical management is recommended.

## 2020-10-13 NOTE — Progress Notes (Signed)
Progress Note  Patient Name: Raymond Carney Date of Encounter: 10/13/2020  [facility] Cardiologist: Raymond Furbish, MD   Subjective   Denies any chest pain or dyspnea.   Inpatient Medications    Scheduled Meds:  amiodarone  400 mg Oral BID   aspirin EC  81 mg Oral Daily   Chlorhexidine Gluconate Cloth  6 each Topical Daily   ezetimibe  10 mg Oral Daily   pantoprazole  40 mg Oral Daily   rosuvastatin  40 mg Oral Daily   sodium chloride flush  3 mL Intravenous Q12H   Continuous Infusions:  sodium chloride 10 mL/hr at 10/13/20 0600   azithromycin Stopped (10/12/20 2252)   cefTRIAXone (ROCEPHIN)  IV Stopped (10/12/20 2053)   heparin 1,150 Units/hr (10/13/20 0400)   norepinephrine (LEVOPHED) Adult infusion Stopped (10/12/20 0937)   PRN Meds:    Vital Signs    Vitals:   10/13/20 0600 10/13/20 0700 10/13/20 0750 10/13/20 0800  BP: (!) 124/96 95/62  (!) 79/61  Pulse: (!) 54 71  72  Resp: 18 18  (!) 22  Temp:   98.2 F (36.8 C)   TempSrc:   Oral   SpO2: 95% 96%  99%  Weight:        Intake/Output Summary (Last 24 hours) at 10/13/2020 0901 Last data filed at 10/13/2020 0752 Gross per 24 hour  Intake 1025.71 ml  Output 2050 ml  Net -1024.29 ml   Last 3 Weights 10/13/2020 10/12/2020 10/11/2020  Weight (lbs) 150 lb 12.7 oz 149 lb 14.6 oz 149 lb 14.6 oz  Weight (kg) 68.4 kg 68 kg 68 kg      Telemetry    Normal sinus rhythm, rate 70s to 80s personally Reviewed  ECG    No new EKG- Personally Reviewed  Physical Exam   GEN: No acute distress.   Neck: No JVD Cardiac: RRR, 2/6 systolic murmur Respiratory: Clear to auscultation bilaterally. GI: Soft, nontender, non-distended  MS: No edema; No deformity. Neuro:  Nonfocal  Psych: Normal affect   Labs    High Sensitivity Troponin:   Recent Labs  Lab 10/10/20 2210 10/11/20 0140 10/11/20 1120 10/11/20 1737  TROPONINIHS 637* 5,019* 25,844* 22,131*      Chemistry Recent Labs  Lab  10/10/20 2210 10/10/20 2217 10/11/20 0500 10/12/20 0411 10/12/20 1000 10/12/20 1004 10/13/20 0500  NA 141   < > 137 136 139   139 138 138  K 3.8   < > 4.6 3.5 3.7   3.7 3.7 3.2*  CL 106  --  105 102  --   --  101  CO2 21*  --  19* 25  --   --  26  GLUCOSE 104*  --  132* 107*  --   --  99  BUN 15  --  16 11  --   --  12  CREATININE 1.34*   < > 1.24 1.04  --   --  1.02  CALCIUM 8.6*  --  8.3* 8.5*  --   --  8.5*  PROT 6.5  --   --   --   --   --   --   ALBUMIN 3.2*  --   --   --   --   --   --   AST 52*  --   --   --   --   --   --   ALT 25  --   --   --   --   --   --  ALKPHOS 57  --   --   --   --   --   --   BILITOT 0.5  --   --   --   --   --   --   GFRNONAA >60   < > >60 >60  --   --  >60  ANIONGAP 14  --  13 9  --   --  11   < > = values in this interval not displayed.     Hematology Recent Labs  Lab 10/11/20 0500 10/12/20 0411 10/12/20 1000 10/12/20 1004 10/13/20 0500  WBC 16.0* 14.4*  --   --  9.0  RBC 4.44 3.78*  --   --  3.54*  HGB 12.3* 10.3* 10.9*   10.9* 10.9* 9.6*  HCT 38.2* 33.2* 32.0*   32.0* 32.0* 31.2*  MCV 86.0 87.8  --   --  88.1  MCH 27.7 27.2  --   --  27.1  MCHC 32.2 31.0  --   --  30.8  RDW 14.6 14.8  --   --  14.7  PLT 313 213  --   --  183    BNP Recent Labs  Lab 10/10/20 2255  BNP 847.8*     DDimer No results for input(s): DDIMER in the last 168 hours.   Radiology    DG Chest 1 View  Result Date: 10/11/2020 CLINICAL DATA:  Central catheter placement EXAM: CHEST  1 VIEW COMPARISON:  October 11, 2020 study obtained earlier in the day FINDINGS: Central catheter tip is in the superior vena cava. No pneumothorax. Airspace opacity in the perihilar regions bilaterally is slightly less compared to earlier in the day. No new opacity evident. Heart size and pulmonary vascular normal. No adenopathy. Old healed fracture lateral left clavicle noted. IMPRESSION: Central catheter tip in superior vena cava without pneumothorax. Slightly less  airspace opacity bilaterally compared to earlier in the day. No new opacity. Stable cardiac silhouette. Electronically Signed   By: Lowella Grip III M.D.   On: 10/11/2020 12:03   CARDIAC CATHETERIZATION  Result Date: 10/12/2020  Ost Cx to Prox Cx lesion is 100% stenosed.  Ramus lesion is 90% stenosed.  Lat Ramus lesion is 70% stenosed.  Prox LAD to Mid LAD lesion is 85% stenosed.  Mid LAD lesion is 85% stenosed.  Mid RCA lesion is 50% stenosed.  Prox RCA-1 lesion is 50% stenosed.  Prox RCA-2 lesion is 35% stenosed.  RPDA lesion is 80% stenosed.  Severe multivessel coronary calcification and coronary obstructive disease with severe calcification with stenoses of 85% in the proximal and mid LAD; 90% near ostial ramus intermediate stenosis with 70% stenosis in the mid vessel, total occlusion of the proximal circumflex with left to left collaterals; and a large dominant diffusely calcified RCA with moderate luminal irregularity with narrowings of 50%, 30% and 50% in the proximal to mid segment with 80% ostial PDA stenosis. Mild pulmonary venous hypertension with mean PA pressure at 31 mmHg. Echo documentation of EF at 35%. RECOMMENDATION: Surgical consultation for consideration of CABG revascularization surgery.  Aggressive lipid-lowering therapy.  Smoking cessation is essential.   DG Chest Port 1 View  Result Date: 10/12/2020 CLINICAL DATA:  Respiratory failure. EXAM: PORTABLE CHEST 1 VIEW COMPARISON:  10/11/2020 FINDINGS: A left jugular catheter terminates over the mid upper SVC, unchanged. The cardiac silhouette is upper limits of normal in size. Aortic atherosclerosis is noted. Right greater than left perihilar and basilar airspace opacities have not significantly  changed. No sizable pleural effusion is identified although the right lateral costophrenic angle was incompletely imaged. No pneumothorax is identified. IMPRESSION: Unchanged right greater than left lung airspace opacities which may  reflect edema or pneumonia. Electronically Signed   By: Logan Bores M.D.   On: 10/12/2020 06:47   ECHOCARDIOGRAM COMPLETE  Result Date: 10/11/2020    ECHOCARDIOGRAM REPORT   Patient Name:   Raymond Carney [name] Date of Exam: 10/11/2020 Medical Rec #:  [id number]      Height:       68.0 in Accession #:    [id number]     Weight:       149.9 lb Date of Birth:  1961/07/11      BSA:          1.808 m Patient Age:    23 years       BP:           93/74 mmHg Patient Gender: M              HR:           72 bpm. Exam Location:  Inpatient Procedure: 2D Echo Indications:    acute diastolic chf 829.56  History:        Patient has prior history of Echocardiogram examinations, most                 recent 09/28/2019. Pneumonia; Signs/Symptoms:elevated troponin.  Sonographer:    Johny Chess Referring Phys: [place]  1. Left ventricular ejection fraction, by estimation, is 35%. The left ventricle has moderately decreased function. The left ventricle demonstrates regional wall motion abnormalities with basal to mid inferolateral and anterolateral akinesis. There is mild left ventricular hypertrophy. Left ventricular diastolic parameters are consistent with Grade II diastolic dysfunction (pseudonormalization).  2. Right ventricular systolic function is normal. The right ventricular size is normal. There is mildly elevated pulmonary artery systolic pressure. The estimated right ventricular systolic pressure is 21.3 mmHg.  3. Left atrial size was mildly dilated.  4. The mitral valve is normal in structure. Mild mitral valve regurgitation. No evidence of mitral stenosis.  5. Tricuspid valve regurgitation is moderate.  6. The aortic valve is tricuspid. Aortic valve regurgitation is not visualized. Mild aortic valve sclerosis is present, with no evidence of aortic valve stenosis.  7. The inferior vena cava is normal in size with greater than 50% respiratory variability, suggesting right atrial pressure of 3 mmHg.  FINDINGS  Left Ventricle: Left ventricular ejection fraction, by estimation, is 35%. The left ventricle has moderately decreased function. The left ventricle demonstrates regional wall motion abnormalities. The left ventricular internal cavity size was normal in size. There is mild left ventricular hypertrophy. Left ventricular diastolic parameters are consistent with Grade II diastolic dysfunction (pseudonormalization). Right Ventricle: The right ventricular size is normal. No increase in right ventricular wall thickness. Right ventricular systolic function is normal. There is mildly elevated pulmonary artery systolic pressure. The tricuspid regurgitant velocity is 3.01  m/s, and with an assumed right atrial pressure of 3 mmHg, the estimated right ventricular systolic pressure is 08.6 mmHg. Left Atrium: Left atrial size was mildly dilated. Right Atrium: Right atrial size was normal in size. Pericardium: There is no evidence of pericardial effusion. Mitral Valve: The mitral valve is normal in structure. Mild mitral annular calcification. Mild mitral valve regurgitation. No evidence of mitral valve stenosis. Tricuspid Valve: The tricuspid valve is normal in structure. Tricuspid valve regurgitation is moderate. Aortic Valve: The aortic  valve is tricuspid. Aortic valve regurgitation is not visualized. Mild aortic valve sclerosis is present, with no evidence of aortic valve stenosis. Pulmonic Valve: The pulmonic valve was normal in structure. Pulmonic valve regurgitation is not visualized. Aorta: The aortic root is normal in size and structure. Venous: The inferior vena cava is normal in size with greater than 50% respiratory variability, suggesting right atrial pressure of 3 mmHg. IAS/Shunts: No atrial level shunt detected by color flow Doppler.  LEFT VENTRICLE PLAX 2D LVIDd:         4.80 cm  Diastology LVIDs:         4.20 cm  LV e' medial:    5.44 cm/s LV PW:         1.40 cm  LV E/e' medial:  16.6 LV IVS:        1.30  cm  LV e' lateral:   5.98 cm/s LVOT diam:     2.00 cm  LV E/e' lateral: 15.1 LV SV:         42 LV SV Index:   23 LVOT Area:     3.14 cm  RIGHT VENTRICLE             IVC RV S prime:     11.40 cm/s  IVC diam: 1.20 cm TAPSE (M-mode): 2.1 cm LEFT ATRIUM             Index       RIGHT ATRIUM           Index LA diam:        4.10 cm 2.27 cm/m  RA Area:     13.50 cm LA Vol (A2C):   80.3 ml 44.41 ml/m RA Volume:   33.20 ml  18.36 ml/m LA Vol (A4C):   55.7 ml 30.81 ml/m LA Biplane Vol: 68.6 ml 37.94 ml/m  AORTIC VALVE LVOT Vmax:   75.70 cm/s LVOT Vmean:  45.200 cm/s LVOT VTI:    0.133 m  AORTA Ao Root diam: 3.30 cm Ao Asc diam:  3.40 cm MITRAL VALVE               TRICUSPID VALVE MV Area (PHT): 3.48 cm    TR Peak grad:   36.2 mmHg MV Decel Time: 218 msec    TR Vmax:        301.00 cm/s MV E velocity: 90.40 cm/s MV A velocity: 40.70 cm/s  SHUNTS MV E/A ratio:  2.22        Systemic VTI:  0.13 m                            Systemic Diam: 2.00 cm Loralie Champagne MD Electronically signed by Loralie Champagne MD Signature Date/Time: 10/11/2020/1:56:24 PM    Final     Cardiac Studies   Echo 10/11/20: 1. Left ventricular ejection fraction, by estimation, is 35%. The left  ventricle has moderately decreased function. The left ventricle  demonstrates regional wall motion abnormalities with basal to mid  inferolateral and anterolateral akinesis. There is  mild left ventricular hypertrophy. Left ventricular diastolic parameters  are consistent with Grade II diastolic dysfunction (pseudonormalization).  2. Right ventricular systolic function is normal. The right ventricular  size is normal. There is mildly elevated pulmonary artery systolic  pressure. The estimated right ventricular systolic pressure is 03.5 mmHg.  3. Left atrial size was mildly dilated.  4. The mitral valve is normal in structure. Mild mitral valve  regurgitation.  No evidence of mitral stenosis.  5. Tricuspid valve regurgitation is moderate.  6.  The aortic valve is tricuspid. Aortic valve regurgitation is not  visualized. Mild aortic valve sclerosis is present, with no evidence of  aortic valve stenosis.  7. The inferior vena cava is normal in size with greater than 50%  respiratory variability, suggesting right atrial pressure of 3 mmHg.   LHC/RHC 10/13/20:   Ost Cx to Prox Cx lesion is 100% stenosed.  Ramus lesion is 90% stenosed.  Lat Ramus lesion is 70% stenosed.  Prox LAD to Mid LAD lesion is 85% stenosed.  Mid LAD lesion is 85% stenosed.  Mid RCA lesion is 50% stenosed.  Prox RCA-1 lesion is 50% stenosed.  Prox RCA-2 lesion is 35% stenosed.  RPDA lesion is 80% stenosed.   Severe multivessel coronary calcification and coronary obstructive disease with severe calcification with stenoses of 85% in the proximal and mid LAD; 90% near ostial ramus intermediate stenosis with 70% stenosis in the mid vessel, total occlusion of the proximal circumflex with left to left collaterals; and a large dominant diffusely calcified RCA with moderate luminal irregularity with narrowings of 50%, 30% and 50% in the proximal to mid segment with 80% ostial PDA stenosis.  Mild pulmonary venous hypertension with mean PA pressure at 31 mmHg.  Echo documentation of EF at 35%.  RECOMMENDATION: Surgical consultation for consideration of CABG revascularization surgery.  Aggressive lipid-lowering therapy.  Smoking cessation is essential.   Patient Profile     59 y.o. Raymond with a hx of severe PAD, multivessel obstructive CAD, GI bleed, right subclavian stenosis, tobacco use who presented with shock   Assessment & Plan    NSTEMI: Denies any chest pain, but troponin peaked at [address] and new systolic dysfunction on echo (EF 35%) with regional wall motion abnormalities.  Cath on 12/14 showed severe multivessel disease with 85% proximal to mid LAD, 90% ostial ramus, total occlusion of proximal LCx, moderate RCA disease, 80% ostial PDA.  Seen by  Dr. Cyndia Bent, high risk CABG candidate, recommend evaluation for PCI. -Will discuss PCI options with interventional cardiology -Continue ASA, statin, heparin gtt  Shock:  Suspect cardiogenic in setting of NSTEMI, troponin peak 25,844. Lactate 3.4 on admission, has normalized.  Now off pressors. Echocardiogram 12/13 shows EF 35% with lateral akinesis.  CVC placed, normal CVP (4) and Co-ox (69%).  Suspect A. fib with RVR on admission was contributing to shock, has improved significantly since converting to sinus rhythm.  Also suspect BP inaccurate in upper extremities due to bilateral subclavian artery stenosis, recommend checking BP in leg as SBP 160s when measured in aorta on cath 12/13  A. fib with RVR: Presented with A. fib with RVR, rates 130s.  Converted to sinus rhythm  -Continue heparin drip -Suspect Afib was likely contributing to his shock on presentation, would load with amiodarone to maintain sinus rhythm.  Plan 400mg  BID x 1 week, then decrease to 400 mg daily x1 week, then 200 mg daily   For questions or updates, please contact [place] Please consult [email or web address] for contact info under        Signed, Donato Heinz, MD  10/13/2020, 9:01 AM

## 2020-10-13 NOTE — Progress Notes (Signed)
NAME:  Raymond Carney, MRN:  [id number], DOB:  Mar 25, 1961, LOS: 2 ADMISSION DATE:  10/10/2020, CONSULTATION DATE:  12/13 REFERRING MD:  Dr. Regenia Skeeter, CHIEF COMPLAINT:  Shock   Brief History   59 year old male presented in new onset AF-RVR. Improved with volume, but he then became hypoxic and hypotensive requiring BiPAP and levophed. PCCM consulted.   History of present illness   59 year old male with PMH as below, which is significant for 40 pack year history active smoker, multivessel CAD, R subclavian artery stenosis, and GI bleed. He had admission about one year prior to this admission for hemorrhagic shock secondary to bleeding gastric ulcers. He suffered type-II MI as a result of anemia and underwent cardiac workup showing multi-vessel CAD. He was not felt to be a surgical candidate at that time due to GI bleeding. Outpatient follow-up has been uneventful.   He now presented to [facility] ED on 12/12 with complaints of weakness and malaise. Upon arrival to ED he was found to be in rapid AF with rates in the 120s. This improved with 2L IVF as part of sepsis workup, however, shortly after he became dyspneic, hypoxic, and hypotensive. He was started on BiPAP and has since been started on norepinephrine with improvement. Denies fever, chills, chest pain, productive cough.   Past Medical History   has a past medical history of Carotid stenosis, COPD (chronic obstructive pulmonary disease) ([place]), History of stroke, Hyperlipidemia, Pituitary tumor, Stenosis of right subclavian artery ([place]), and TIA (transient ischemic attack).   Significant Hospital Events   12/12 admit 12/13 off BiPAP 12/14 RHC / [place] 12/15 Transferred out of ICU  Consults:  Cardiology, TCTS  Procedures:  L IJ CVL 12/13 >  R radial art line 12/12 > 12/13. L radial art line 12/13 > 12/15.  Significant Diagnostic Tests:  POC echo 12/13 > EF 30% Echocardiogram 12/13 > EF 35%, G2DD. RHC / [place] 12/14 > severe multivessel  coronary calfications and CAD with stenosis of 85% in prox and mid LAD, 90% near ostial ramus intermediate stenosis with 70% stenosis in the mid vessel.  Total occlusion of the prox Cx with L to R collaterals, large dominant diffusely calcified RCA.  Mild pulm venous HTN.  Micro Data:  COVID 12/12 > neg. Flu 12/12 > neg. Blood 12/13 >   Antimicrobials:  CTX 12/12 > stop date 12/16. Azithromycin 12/12 > stop date 12/16.  Interim history/subjective:  Cath yesterday with sever 3v disease.  TCTS recommending limited PCI hopefully to LAD. Cards discussing and hoping to possibly perform 12/16. Pt eager to get home by the weekend so he can spend Christmas with his family.  Objective   Blood pressure (!) 124/96, pulse (!) 54, temperature 98.4 F (36.9 C), temperature source Oral, resp. rate 18, weight 68.4 kg, SpO2 95 %. CVP:  [8 mmHg] 8 mmHg      Intake/Output Summary (Last 24 hours) at 10/13/2020 0730 Last data filed at 10/13/2020 0600 Gross per 24 hour  Intake 1099.33 ml  Output 1650 ml  Net -550.67 ml   Filed Weights   10/11/20 1923 10/12/20 0500 10/13/20 0436  Weight: 68 kg 68 kg 68.4 kg    Examination:  General: Adult male, resting comfortably, in NAD. Neuro: A&O x 3, no deficits. HEENT: [place]/AT. Sclerae anicteric. EOMI. BiPAP in place Cardiovascular: RRR, no M/R/G.  Lungs: Respirations even and unlabored.  CTA bilaterally, No W/R/R.  Abdomen: BS x 4, soft, NT/ND.  Musculoskeletal: No gross  deformities, no edema.  Skin: Intact, cool, no rashes.   Assessment & Plan:   Acute hypoxemic respiratory failure: likely due to pulmonary edema, but cannot rule out CAP with bilateral infiltrates and leukocytosis.  - SpO2 goal > 90%. - Empiric CAP coverage through 12/16 for 5 days total. - Blood cultures pending.   Shock: felt to be cardiogenic 2/2 A.fib RVR/NSTEMI. Shock now resolved.  Art line and cuff pressures in upper extremities are inaccurate as they are reading SBP in 70's;  however, aortic pressures 160 while in cath lab. - D/c arterial line. - Try measuring BP on calf. - Supportive care.   Acute on chronic HFpEF, known CAD.  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   - Evaluated by TCTS who recommends limited PCI as first option (particularly of LAD) and if this is not possible, then can consider CABG; however, it would not be without significantly increased risk. - Discussed with Dr. Gardiner Rhyme who is hoping to have PCI done 12/16.  He will discuss with Dr. Tamala Julian who saw pt for cath on his last admission. - If any plans arise for CABG, then will need vascular input for consideration of right carotid revascularization to reduce his risk of stroke. - Continue home ASA, ezetimibe, rosuvastatin (dose increased from 20mg  to 40mg  by cards).  A.fib RVR - now in NSR on amio. - Continue heparin gtt. - Continue amio 400mg  BID x 1 week then 400mg  daily x 1 week then 200mg  daily.  COPD without acute exacerbation (Active 1 ppd smoker). - PRN albuterol.  Hypokalemia. - 40 mEq K. - Follow BMP.  Stable for transfer out of ICU to tele.  Discussed with cardiology who is hoping to perform PCI to LAD 12/16.  Will ask TRH to assume care in Am 12/16 with PCCM off at that time.   Best practice (evaluated daily)  Diet: Heart healthy diet. Pain/Anxiety/Delirium protocol (if indicated): NA VAP protocol (if indicated): NA DVT prophylaxis: heparin infusion GI prophylaxis: NA Glucose control: NA Mobility: BR last date of multidisciplinary goals of care discussion Family and staff present  Summary of discussion  Follow up goals of care discussion due 12/20 Code Status: FULL Disposition: Transfer out of ICU to tele.  TRH to assume care in AM 11/16 with PCCM off.   Montey Hora, [place] 10/13/2020, 7:30 AM

## 2020-10-14 ENCOUNTER — Other Ambulatory Visit (HOSPITAL_COMMUNITY): Payer: Self-pay | Admitting: Internal Medicine

## 2020-10-14 DIAGNOSIS — I5041 Acute combined systolic (congestive) and diastolic (congestive) heart failure: Secondary | ICD-10-CM

## 2020-10-14 LAB — CBC
HCT: 31.7 % — ABNORMAL LOW (ref 39.0–52.0)
Hemoglobin: 10.3 g/dL — ABNORMAL LOW (ref 13.0–17.0)
MCH: 27.9 pg (ref 26.0–34.0)
MCHC: 32.5 g/dL (ref 30.0–36.0)
MCV: 85.9 fL (ref 80.0–100.0)
Platelets: 211 10*3/uL (ref 150–400)
RBC: 3.69 MIL/uL — ABNORMAL LOW (ref 4.22–5.81)
RDW: 14.6 % (ref 11.5–15.5)
WBC: 9.3 10*3/uL (ref 4.0–10.5)
nRBC: 0 % (ref 0.0–0.2)

## 2020-10-14 LAB — BASIC METABOLIC PANEL
Anion gap: 11 (ref 5–15)
BUN: 8 mg/dL (ref 8–23)
CO2: 23 mmol/L (ref 22–32)
Calcium: 8.7 mg/dL — ABNORMAL LOW (ref 8.9–10.3)
Chloride: 104 mmol/L (ref 98–111)
Creatinine, Ser: 1.05 mg/dL (ref 0.61–1.24)
GFR, Estimated: >60 mL/min (ref >60)
Glucose, Bld: 96 mg/dL (ref 70–99)
Potassium: 3.9 mmol/L (ref 3.5–5.1)
Sodium: 138 mmol/L (ref 135–145)

## 2020-10-14 MED ORDER — ASPIRIN 81 MG PO TBEC: 81.0000 mg | DELAYED_RELEASE_TABLET | Freq: Every day | ORAL | 11 refills | Status: DC

## 2020-10-14 MED ORDER — METOPROLOL SUCCINATE ER 25 MG PO TB24: 25.0000 mg | ORAL_TABLET | Freq: Every day | ORAL | 1 refills | Status: DC

## 2020-10-14 MED ORDER — EZETIMIBE 10 MG PO TABS: 10.0000 mg | ORAL_TABLET | Freq: Every day | ORAL | 1 refills | Status: DC

## 2020-10-14 MED ORDER — ADULT MULTIVITAMIN W/MINERALS CH: 1.0000 | ORAL_TABLET | ORAL | Status: DC

## 2020-10-14 MED ORDER — AMIODARONE HCL 200 MG PO TABS: 200.0000 mg | ORAL_TABLET | Freq: Every day | ORAL | 1 refills | Status: DC

## 2020-10-14 MED ORDER — AMOXICILLIN-POT CLAVULANATE 875-125 MG PO TABS: 1.0000 | ORAL_TABLET | Freq: Two times a day (BID) | ORAL | 0 refills | Status: DC

## 2020-10-14 MED ORDER — AMIODARONE HCL 400 MG PO TABS: 400.0000 mg | ORAL_TABLET | Freq: Two times a day (BID) | ORAL | 0 refills | Status: DC

## 2020-10-14 MED ORDER — ROSUVASTATIN CALCIUM 40 MG PO TABS: 40.0000 mg | ORAL_TABLET | Freq: Every day | ORAL | 1 refills | Status: DC

## 2020-10-14 MED ORDER — APIXABAN 5 MG PO TABS: 5.0000 mg | ORAL_TABLET | Freq: Two times a day (BID) | ORAL | 1 refills | Status: DC

## 2020-10-14 MED ORDER — AMIODARONE HCL 200 MG PO TABS: 400.0000 mg | ORAL_TABLET | Freq: Every day | ORAL | 0 refills | Status: DC

## 2020-10-14 MED ORDER — METOPROLOL SUCCINATE ER 25 MG PO TB24
25.0000 mg | ORAL_TABLET | Freq: Every day | ORAL | Status: DC
  Administered 2020-10-14: 10:00:00 25 mg via ORAL
  Filled 2020-10-14: qty 1

## 2020-10-14 MED FILL — ASPIRIN LOW DOSE 81 MG TBEC: 81 | 30 days supply | Qty: 30 | Fill #0

## 2020-10-14 MED FILL — METOPROLOL SUCCINATE ER 25: 25 | 30 days supply | Qty: 30 | Fill #0

## 2020-10-14 MED FILL — AMOX-CLAV 875-125 MG TABLET: 875-125 | 2 days supply | Qty: 4 | Fill #0

## 2020-10-14 MED FILL — ROSUVASTATIN CALCIUM 40 MG: 40 | 30 days supply | Qty: 30 | Fill #0

## 2020-10-14 MED FILL — ELIQUIS 5 MG TABLET: 5 | 30 days supply | Qty: 60 | Fill #0

## 2020-10-14 MED FILL — AMIODARONE HCL 200 MG TAB: 200 | 30 days supply | Qty: 45 | Fill #0

## 2020-10-14 NOTE — Discharge Summary (Signed)
Physician Discharge Summary  Raymond Carney:[id number] DOB: [birth year] DOA: 10/10/2020  PCP: Antony Contras, MD  Admit date: 10/10/2020 Discharge date: 10/14/2020  Admitted From:Home.  Disposition:  Home.   Recommendations for Outpatient Follow-up:  1. Follow up with PCP in 1-2 weeks 2. Please obtain BMP/CBC in one week 3.   Please follow up With cardiology as recommended.   Discharge Condition: stable.  CODE STATUS:FULL CODE.  Diet recommendation: Heart Healthy  Brief/Interim Summary: 59 year old male with extensive vasculopathy who presented with new onset of A. fib with RVR and acute on chronic systolic congestive heart failure   Discharge Diagnoses:  Active Problems:   Cardiogenic shock (HCC)   Acute respiratory failure with hypoxia (HCC)   Pneumonia due to infectious organism   Elevated troponin   Ischemic cardiomyopathy   Atrial fibrillation (HCC)  Acute hypoxemic respiratory failure: likely due to pulmonary edema, but cannot rule out CAP with bilateral infiltrates and leukocytosis.  - SpO2 goal > 90%. Complete the course of antibiotics on discharge.   Shock: felt to be cardiogenic 2/2 A.fib RVR/NSTEMI. He was initially started on pressors and weaned off. Also suspect BP inaccurate in upper extremities due to bilateral subclavian artery stenosis.    Shock now resolved.     Acute on chronic HFpEF, known CAD.  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    Echocardiogram 12/13 shows EF 35% with lateral akinesis - Evaluated by [facility] who recommends limited PCI as first option (particularly of LAD) and if this is not possible, then can consider CABG; however, it would not be without significantly increased risk. Cardiology discussed with Dr. Cyndia Bent and cardiac surgery: Not a good candidate for CABG given severe PAD/COPD, but could consider if vascular surgery able to do right carotid revascularization at the same time.  Also discussed with Dr. Tamala Julian in interventional  cardiology, not a PCI candidate given tortuosity/calcifications of vessels.  Recommend medical management for now, patient not interested in staying for further CABG work-up.  Can follow-up as outpatient for further evaluation. -Continue ASA, statin, Eliquis   A.fib RVR - now in NSR on amio. Continue with eliquis and amiodarone taper as recommended by cardiology.   COPD without acute exacerbation (Active 1 ppd smoker). - PRN albuterol.  Hypokalemia. Resplaced.   Discharge Instructions  Discharge Instructions    Diet - low sodium heart healthy   Complete by: As directed    Discharge instructions   Complete by: As directed    Please follow up with cardiology as recommended.     Allergies as of 10/14/2020   No Known Allergies     Medication List    STOP taking these medications   NEOMYCIN-POLYMYXIN-HYDROCORTISONE 1 % Soln OTIC solution Commonly known as: CORTISPORIN     TAKE these medications   acetaminophen 325 MG tablet Commonly known as: TYLENOL Take 325 mg by mouth every 6 (six) hours as needed (for discomfort).   amiodarone 400 MG tablet Commonly known as: PACERONE Take 1 tablet (400 mg total) by mouth 2 (two) times daily for 3 days.   amiodarone 200 MG tablet Commonly known as: Pacerone Take 2 tablets (400 mg total) by mouth daily for 6 days. Start taking on: October 18, 2020   amiodarone 200 MG tablet Commonly known as: Pacerone Take 1 tablet (200 mg total) by mouth daily. Start taking on: October 25, 2020   amoxicillin-clavulanate 875-125 MG tablet Commonly known as: Augmentin Take 1 tablet by mouth every 12 (twelve) hours for 2  days.   apixaban 5 MG Tabs tablet Commonly known as: ELIQUIS Take 1 tablet (5 mg total) by mouth 2 (two) times daily.   aspirin 81 MG EC tablet Take 1 tablet (81 mg total) by mouth daily. Swallow whole. Start taking on: October 15, 2020 What changed:   additional instructions  Another medication with the same name  was removed. Continue taking this medication, and follow the directions you see here.   ezetimibe 10 MG tablet Commonly known as: ZETIA Take 1 tablet (10 mg total) by mouth daily.   metoprolol succinate 25 MG 24 hr tablet Commonly known as: TOPROL-XL Take 1 tablet (25 mg total) by mouth daily. Start taking on: October 15, 2020   multivitamin with minerals Tabs tablet Take 1 tablet by mouth 3 (three) times a week. Start taking on: October 15, 2020   pantoprazole 40 MG tablet Commonly known as: PROTONIX Take 1 tablet (40 mg total) by mouth 2 (two) times daily. What changed: when to take this   rosuvastatin 40 MG tablet Commonly known as: CRESTOR Take 1 tablet (40 mg total) by mouth daily. Start taking on: October 15, 2020 What changed:   medication strength  how much to take       Follow-up Information    Jerline Pain, MD Follow up on 11/02/2020.   Specialty: Cardiology Why: at 8:40am  Contact information: [address] [address] [phone]              No Known Allergies  Consultations:  Cardiology  PCCM.    Procedures/Studies: DG Chest 1 View  Result Date: 10/11/2020 CLINICAL DATA:  Central catheter placement EXAM: CHEST  1 VIEW COMPARISON:  October 11, 2020 study obtained earlier in the day FINDINGS: Central catheter tip is in the superior vena cava. No pneumothorax. Airspace opacity in the perihilar regions bilaterally is slightly less compared to earlier in the day. No new opacity evident. Heart size and pulmonary vascular normal. No adenopathy. Old healed fracture lateral left clavicle noted. IMPRESSION: Central catheter tip in superior vena cava without pneumothorax. Slightly less airspace opacity bilaterally compared to earlier in the day. No new opacity. Stable cardiac silhouette. Electronically Signed   By: Lowella Grip III M.D.   On: 10/11/2020 12:03   CARDIAC CATHETERIZATION  Result Date: 10/12/2020   Ost Cx to Prox Cx lesion is 100% stenosed.  Ramus lesion is 90% stenosed.  Lat Ramus lesion is 70% stenosed.  Prox LAD to Mid LAD lesion is 85% stenosed.  Mid LAD lesion is 85% stenosed.  Mid RCA lesion is 50% stenosed.  Prox RCA-1 lesion is 50% stenosed.  Prox RCA-2 lesion is 35% stenosed.  RPDA lesion is 80% stenosed.  Severe multivessel coronary calcification and coronary obstructive disease with severe calcification with stenoses of 85% in the proximal and mid LAD; 90% near ostial ramus intermediate stenosis with 70% stenosis in the mid vessel, total occlusion of the proximal circumflex with left to left collaterals; and a large dominant diffusely calcified RCA with moderate luminal irregularity with narrowings of 50%, 30% and 50% in the proximal to mid segment with 80% ostial PDA stenosis. Mild pulmonary venous hypertension with mean PA pressure at 31 mmHg. Echo documentation of EF at 35%. RECOMMENDATION: Surgical consultation for consideration of CABG revascularization surgery.  Aggressive lipid-lowering therapy.  Smoking cessation is essential.   DG Chest Port 1 View  Result Date: 10/12/2020 CLINICAL DATA:  Respiratory failure. EXAM: PORTABLE CHEST 1 VIEW  COMPARISON:  10/11/2020 FINDINGS: A left jugular catheter terminates over the mid upper SVC, unchanged. The cardiac silhouette is upper limits of normal in size. Aortic atherosclerosis is noted. Right greater than left perihilar and basilar airspace opacities have not significantly changed. No sizable pleural effusion is identified although the right lateral costophrenic angle was incompletely imaged. No pneumothorax is identified. IMPRESSION: Unchanged right greater than left lung airspace opacities which may reflect edema or pneumonia. Electronically Signed   By: Logan Bores M.D.   On: 10/12/2020 06:47   DG Chest Port 1 View  Result Date: 10/11/2020 CLINICAL DATA:  Hypoxia EXAM: PORTABLE CHEST 1 VIEW COMPARISON:  12/12/2019 FINDINGS:  Bilateral perihilar airspace opacities are again noted similar prior study. Heart is mildly enlarged. No effusions or pneumothorax. IMPRESSION: Stable bilateral perihilar airspace disease. Electronically Signed   By: Rolm Baptise M.D.   On: 10/11/2020 02:26   DG Chest Portable 1 View  Result Date: 10/10/2020 CLINICAL DATA:  Weakness, atrial fibrillation EXAM: PORTABLE CHEST 1 VIEW COMPARISON:  02/02/2015 FINDINGS: Bilateral perihilar airspace disease. Mild cardiomegaly. No effusions. No acute bony abnormality. IMPRESSION: Bilateral perihilar opacities, likely edema/CHF. Electronically Signed   By: Rolm Baptise M.D.   On: 10/10/2020 21:53   ECHOCARDIOGRAM COMPLETE  Result Date: 10/11/2020    ECHOCARDIOGRAM REPORT   Patient Name:   CHUCKIE MCCATHERN [facility] Date of Exam: 10/11/2020 Medical Rec #:  [id number]      Height:       68.0 in Accession #:    [id number]     Weight:       149.9 lb Date of Birth:  08/22/1961      BSA:          1.808 m Patient Age:    20 years       BP:           93/74 mmHg Patient Gender: M              HR:           72 bpm. Exam Location:  Inpatient Procedure: 2D Echo Indications:    acute diastolic chf 947.09  History:        Patient has prior history of Echocardiogram examinations, most                 recent 09/28/2019. Pneumonia; Signs/Symptoms:elevated troponin.  Sonographer:    Johny Chess Referring Phys: [place]  1. Left ventricular ejection fraction, by estimation, is 35%. The left ventricle has moderately decreased function. The left ventricle demonstrates regional wall motion abnormalities with basal to mid inferolateral and anterolateral akinesis. There is mild left ventricular hypertrophy. Left ventricular diastolic parameters are consistent with Grade II diastolic dysfunction (pseudonormalization).  2. Right ventricular systolic function is normal. The right ventricular size is normal. There is mildly elevated pulmonary artery systolic pressure. The  estimated right ventricular systolic pressure is 62.8 mmHg.  3. Left atrial size was mildly dilated.  4. The mitral valve is normal in structure. Mild mitral valve regurgitation. No evidence of mitral stenosis.  5. Tricuspid valve regurgitation is moderate.  6. The aortic valve is tricuspid. Aortic valve regurgitation is not visualized. Mild aortic valve sclerosis is present, with no evidence of aortic valve stenosis.  7. The inferior vena cava is normal in size with greater than 50% respiratory variability, suggesting right atrial pressure of 3 mmHg. FINDINGS  Left Ventricle: Left ventricular ejection fraction, by estimation, is 35%. The left ventricle has  moderately decreased function. The left ventricle demonstrates regional wall motion abnormalities. The left ventricular internal cavity size was normal in size. There is mild left ventricular hypertrophy. Left ventricular diastolic parameters are consistent with Grade II diastolic dysfunction (pseudonormalization). Right Ventricle: The right ventricular size is normal. No increase in right ventricular wall thickness. Right ventricular systolic function is normal. There is mildly elevated pulmonary artery systolic pressure. The tricuspid regurgitant velocity is 3.01  m/s, and with an assumed right atrial pressure of 3 mmHg, the estimated right ventricular systolic pressure is 59.1 mmHg. Left Atrium: Left atrial size was mildly dilated. Right Atrium: Right atrial size was normal in size. Pericardium: There is no evidence of pericardial effusion. Mitral Valve: The mitral valve is normal in structure. Mild mitral annular calcification. Mild mitral valve regurgitation. No evidence of mitral valve stenosis. Tricuspid Valve: The tricuspid valve is normal in structure. Tricuspid valve regurgitation is moderate. Aortic Valve: The aortic valve is tricuspid. Aortic valve regurgitation is not visualized. Mild aortic valve sclerosis is present, with no evidence of aortic  valve stenosis. Pulmonic Valve: The pulmonic valve was normal in structure. Pulmonic valve regurgitation is not visualized. Aorta: The aortic root is normal in size and structure. Venous: The inferior vena cava is normal in size with greater than 50% respiratory variability, suggesting right atrial pressure of 3 mmHg. IAS/Shunts: No atrial level shunt detected by color flow Doppler.  LEFT VENTRICLE PLAX 2D LVIDd:         4.80 cm  Diastology LVIDs:         4.20 cm  LV e' medial:    5.44 cm/s LV PW:         1.40 cm  LV E/e' medial:  16.6 LV IVS:        1.30 cm  LV e' lateral:   5.98 cm/s LVOT diam:     2.00 cm  LV E/e' lateral: 15.1 LV SV:         42 LV SV Index:   23 LVOT Area:     3.14 cm  RIGHT VENTRICLE             IVC RV S prime:     11.40 cm/s  IVC diam: 1.20 cm TAPSE (M-mode): 2.1 cm LEFT ATRIUM             Index       RIGHT ATRIUM           Index LA diam:        4.10 cm 2.27 cm/m  RA Area:     13.50 cm LA Vol (A2C):   80.3 ml 44.41 ml/m RA Volume:   33.20 ml  18.36 ml/m LA Vol (A4C):   55.7 ml 30.81 ml/m LA Biplane Vol: 68.6 ml 37.94 ml/m  AORTIC VALVE LVOT Vmax:   75.70 cm/s LVOT Vmean:  45.200 cm/s LVOT VTI:    0.133 m  AORTA Ao Root diam: 3.30 cm Ao Asc diam:  3.40 cm MITRAL VALVE               TRICUSPID VALVE MV Area (PHT): 3.48 cm    TR Peak grad:   36.2 mmHg MV Decel Time: 218 msec    TR Vmax:        301.00 cm/s MV E velocity: 90.40 cm/s MV A velocity: 40.70 cm/s  SHUNTS MV E/A ratio:  2.22        Systemic VTI:  0.13 m  Systemic Diam: 2.00 cm Loralie Champagne MD Electronically signed by Loralie Champagne MD Signature Date/Time: 10/11/2020/1:56:24 PM    Final        Subjective:  No new complaints.  Discharge Exam: Vitals:   10/14/20 0509 10/14/20 0910  BP: 132/66 121/76  Pulse: 70 72  Resp: 18 19  Temp: 97.7 F (36.5 C) 98.2 F (36.8 C)  SpO2: 94% 95%   Vitals:   10/13/20 1114 10/13/20 1338 10/14/20 0509 10/14/20 0910  BP:  107/76 132/66 121/76  Pulse:  74  70 72  Resp:  18 18 19   Temp: 98.5 F (36.9 C) 98.5 F (36.9 C) 97.7 F (36.5 C) 98.2 F (36.8 C)  TempSrc: Oral Oral Oral Oral  SpO2:  100% 94% 95%  Weight:  65.6 kg 65.2 kg   Height:  5\' 9"  (1.753 m)      General: Pt is alert, awake, not in acute distress Cardiovascular: RRR, S1/S2 +, no rubs, no gallops Respiratory: CTA bilaterally, no wheezing, no rhonchi Abdominal: Soft, NT, ND, bowel sounds + Extremities: no edema, no cyanosis    The results of significant diagnostics from this hospitalization (including imaging, microbiology, ancillary and laboratory) are listed below for reference.     Microbiology: Recent Results (from the past 240 hour(s))  Resp Panel by RT-PCR (Flu A&B, Covid) Nasopharyngeal Swab     Status: None   Collection Time: 10/10/20 10:10 PM   Specimen: Nasopharyngeal Swab; Nasopharyngeal(NP) swabs in vial transport medium  Result Value Ref Range Status   SARS Coronavirus 2 by RT PCR NEGATIVE NEGATIVE Final    Comment: (NOTE) SARS-CoV-2 target nucleic acids are NOT DETECTED.  The SARS-CoV-2 RNA is generally detectable in upper respiratory specimens during the acute phase of infection. The lowest concentration of SARS-CoV-2 viral copies this assay can detect is 138 copies/mL. A negative result does not preclude SARS-Cov-2 infection and should not be used as the sole basis for treatment or other patient management decisions. A negative result may occur with  improper specimen collection/handling, submission of specimen other than nasopharyngeal swab, presence of viral mutation(s) within the areas targeted by this assay, and inadequate number of viral copies(<138 copies/mL). A negative result must be combined with clinical observations, patient history, and epidemiological information. The expected result is Negative.  Fact Sheet for Patients:  EntrepreneurPulse.com.au  Fact Sheet for Healthcare Providers:   IncredibleEmployment.be  This test is no t yet approved or cleared by the [place] FDA and  has been authorized for detection and/or diagnosis of SARS-CoV-2 by FDA under an Emergency Use Authorization (EUA). This EUA will remain  in effect (meaning this test can be used) for the duration of the COVID-19 declaration under Section 564(b)(1) of the Act, 21 U.S.C.section 360bbb-3(b)(1), unless the authorization is terminated  or revoked sooner.       Influenza A by PCR NEGATIVE NEGATIVE Final   Influenza B by PCR NEGATIVE NEGATIVE Final    Comment: (NOTE) The Xpert Xpress SARS-CoV-2/FLU/RSV plus assay is intended as an aid in the diagnosis of influenza from Nasopharyngeal swab specimens and should not be used as a sole basis for treatment. Nasal washings and aspirates are unacceptable for Xpert Xpress SARS-CoV-2/FLU/RSV testing.  Fact Sheet for Patients: EntrepreneurPulse.com.au  Fact Sheet for Healthcare Providers: IncredibleEmployment.be  This test is not yet approved or cleared by the [place] FDA and has been authorized for detection and/or diagnosis of SARS-CoV-2 by FDA under an Emergency Use Authorization (EUA). This EUA will remain in  effect (meaning this test can be used) for the duration of the COVID-19 declaration under Section 564(b)(1) of the Act, 21 U.S.C. section 360bbb-3(b)(1), unless the authorization is terminated or revoked.  Performed at [facility], [place] [address], [place], [place] [address]   Culture, blood (routine x 2)     Status: None (Preliminary result)   Collection Time: 10/10/20 10:57 PM   Specimen: BLOOD RIGHT HAND  Result Value Ref Range Status   Specimen Description BLOOD RIGHT HAND  Final   Special Requests   Final    BOTTLES DRAWN AEROBIC AND ANAEROBIC Blood Culture results may not be optimal due to an inadequate volume of blood received in culture bottles   Culture    Final    NO GROWTH 3 DAYS Performed at [facility], [place] [address], [place], [place] [address]    Report Status PENDING  Incomplete  Culture, blood (routine x 2)     Status: None (Preliminary result)   Collection Time: 10/10/20 11:20 PM   Specimen: BLOOD  Result Value Ref Range Status   Specimen Description BLOOD SITE NOT SPECIFIED  Final   Special Requests   Final    BOTTLES DRAWN AEROBIC AND ANAEROBIC Blood Culture adequate volume   Culture   Final    NO GROWTH 3 DAYS Performed at [facility], [address], [place], [place] [address]    Report Status PENDING  Incomplete  MRSA PCR Screening     Status: None   Collection Time: 10/11/20  8:00 PM   Specimen: Nasopharyngeal  Result Value Ref Range Status   MRSA by PCR NEGATIVE NEGATIVE Final    Comment:        The GeneXpert MRSA Assay (FDA approved for NASAL specimens only), is one component of a comprehensive MRSA colonization surveillance program. It is not intended to diagnose MRSA infection nor to guide or monitor treatment for MRSA infections. Performed at [facility], [place] [address] [place], [place] [address]      Labs: BNP (last 3 results) Recent Labs    10/10/20 2255  BNP 620.3*   Basic Metabolic Panel: Recent Labs  Lab 10/10/20 2210 10/10/20 2217 10/11/20 0207 10/11/20 0500 10/12/20 0411 10/12/20 1000 10/12/20 1004 10/13/20 0500 10/14/20 0600  NA 141   < >  --  137 136 139  139 138 138 138  K 3.8   < >  --  4.6 3.5 3.7  3.7 3.7 3.2* 3.9  CL 106  --   --  105 102  --   --  101 104  CO2 21*  --   --  19* 25  --   --  26 23  GLUCOSE 104*  --   --  132* 107*  --   --  99 96  BUN 15  --   --  16 11  --   --  12 8  CREATININE 1.34*  --  1.27* 1.24 1.04  --   --  1.02 1.05  CALCIUM 8.6*  --   --  8.3* 8.5*  --   --  8.5* 8.7*  MG  --   --   --  1.9 1.8  --   --   --   --   PHOS  --   --   --  3.8 2.2*  --   --   --   --    < > = values in this  interval not displayed.   Liver  Function Tests: Recent Labs  Lab 10/10/20 2210  AST 52*  ALT 25  ALKPHOS 57  BILITOT 0.5  PROT 6.5  ALBUMIN 3.2*   No results for input(s): LIPASE, AMYLASE in the last 168 hours. No results for input(s): AMMONIA in the last 168 hours. CBC: Recent Labs  Lab 10/10/20 2210 10/10/20 2217 10/11/20 0500 10/12/20 0411 10/12/20 1000 10/12/20 1004 10/13/20 0500 10/13/20 1539 10/14/20 0600  WBC 19.0*   < > 16.0* 14.4*  --   --  9.0 9.4 9.3  NEUTROABS 16.2*  --   --   --   --   --   --   --   --   HGB 12.6*   < > 12.3* 10.3* 10.9*  10.9* 10.9* 9.6* 10.3* 10.3*  HCT 39.7   < > 38.2* 33.2* 32.0*  32.0* 32.0* 31.2* 32.7* 31.7*  MCV 88.4   < > 86.0 87.8  --   --  88.1 86.7 85.9  PLT 240   < > 313 213  --   --  183 194 211   < > = values in this interval not displayed.   Cardiac Enzymes: No results for input(s): CKTOTAL, CKMB, CKMBINDEX, TROPONINI in the last 168 hours. BNP: Invalid input(s): POCBNP CBG: Recent Labs  Lab 10/10/20 2307  GLUCAP 97   D-Dimer No results for input(s): DDIMER in the last 72 hours. Hgb A1c No results for input(s): HGBA1C in the last 72 hours. Lipid Profile Recent Labs    10/13/20 0500  CHOL 149  HDL 51  LDLCALC 83  TRIG 73  CHOLHDL 2.9   Thyroid function studies No results for input(s): TSH, T4TOTAL, T3FREE, THYROIDAB in the last 72 hours.  Invalid input(s): FREET3 Anemia work up No results for input(s): VITAMINB12, FOLATE, FERRITIN, TIBC, IRON, RETICCTPCT in the last 72 hours. Urinalysis    Component Value Date/Time   COLORURINE YELLOW 10/10/2020 2320   APPEARANCEUR CLEAR 10/10/2020 2320   LABSPEC 1.016 10/10/2020 2320   PHURINE 5.0 10/10/2020 2320   GLUCOSEU 50 (A) 10/10/2020 2320   HGBUR SMALL (A) 10/10/2020 2320   BILIRUBINUR NEGATIVE 10/10/2020 2320   KETONESUR NEGATIVE 10/10/2020 2320   PROTEINUR 100 (A) 10/10/2020 2320   NITRITE NEGATIVE 10/10/2020 2320   LEUKOCYTESUR NEGATIVE 10/10/2020 2320   Sepsis Labs Invalid  input(s): PROCALCITONIN,  WBC,  LACTICIDVEN Microbiology Recent Results (from the past 240 hour(s))  Resp Panel by RT-PCR (Flu A&B, Covid) Nasopharyngeal Swab     Status: None   Collection Time: 10/10/20 10:10 PM   Specimen: Nasopharyngeal Swab; Nasopharyngeal(NP) swabs in vial transport medium  Result Value Ref Range Status   SARS Coronavirus 2 by RT PCR NEGATIVE NEGATIVE Final    Comment: (NOTE) SARS-CoV-2 target nucleic acids are NOT DETECTED.  The SARS-CoV-2 RNA is generally detectable in upper respiratory specimens during the acute phase of infection. The lowest concentration of SARS-CoV-2 viral copies this assay can detect is 138 copies/mL. A negative result does not preclude SARS-Cov-2 infection and should not be used as the sole basis for treatment or other patient management decisions. A negative result may occur with  improper specimen collection/handling, submission of specimen other than nasopharyngeal swab, presence of viral mutation(s) within the areas targeted by this assay, and inadequate number of viral copies(<138 copies/mL). A negative result must be combined with clinical observations, patient history, and epidemiological information. The expected result is Negative.  Fact Sheet for Patients:  EntrepreneurPulse.com.au  Fact  Sheet for Healthcare Providers:  IncredibleEmployment.be  This test is no t yet approved or cleared by the [place] FDA and  has been authorized for detection and/or diagnosis of SARS-CoV-2 by FDA under an Emergency Use Authorization (EUA). This EUA will remain  in effect (meaning this test can be used) for the duration of the COVID-19 declaration under Section 564(b)(1) of the Act, 21 U.S.C.section 360bbb-3(b)(1), unless the authorization is terminated  or revoked sooner.       Influenza A by PCR NEGATIVE NEGATIVE Final   Influenza B by PCR NEGATIVE NEGATIVE Final    Comment: (NOTE) The Xpert  Xpress SARS-CoV-2/FLU/RSV plus assay is intended as an aid in the diagnosis of influenza from Nasopharyngeal swab specimens and should not be used as a sole basis for treatment. Nasal washings and aspirates are unacceptable for Xpert Xpress SARS-CoV-2/FLU/RSV testing.  Fact Sheet for Patients: EntrepreneurPulse.com.au  Fact Sheet for Healthcare Providers: IncredibleEmployment.be  This test is not yet approved or cleared by the [place] FDA and has been authorized for detection and/or diagnosis of SARS-CoV-2 by FDA under an Emergency Use Authorization (EUA). This EUA will remain in effect (meaning this test can be used) for the duration of the COVID-19 declaration under Section 564(b)(1) of the Act, 21 U.S.C. section 360bbb-3(b)(1), unless the authorization is terminated or revoked.  Performed at [facility], [place] [address] [place], [place] [address]   Culture, blood (routine x 2)     Status: None (Preliminary result)   Collection Time: 10/10/20 10:57 PM   Specimen: BLOOD RIGHT HAND  Result Value Ref Range Status   Specimen Description BLOOD RIGHT HAND  Final   Special Requests   Final    BOTTLES DRAWN AEROBIC AND ANAEROBIC Blood Culture results may not be optimal due to an inadequate volume of blood received in culture bottles   Culture   Final    NO GROWTH 3 DAYS Performed at [facility], [place] [address] [place], [place] [address]    Report Status PENDING  Incomplete  Culture, blood (routine x 2)     Status: None (Preliminary result)   Collection Time: 10/10/20 11:20 PM   Specimen: BLOOD  Result Value Ref Range Status   Specimen Description BLOOD SITE NOT SPECIFIED  Final   Special Requests   Final    BOTTLES DRAWN AEROBIC AND ANAEROBIC Blood Culture adequate volume   Culture   Final    NO GROWTH 3 DAYS Performed at [facility], [address] [place], [place] [address]    Report Status PENDING  Incomplete   MRSA PCR Screening     Status: None   Collection Time: 10/11/20  8:00 PM   Specimen: Nasopharyngeal  Result Value Ref Range Status   MRSA by PCR NEGATIVE NEGATIVE Final    Comment:        The GeneXpert MRSA Assay (FDA approved for NASAL specimens only), is one component of a comprehensive MRSA colonization surveillance program. It is not intended to diagnose MRSA infection nor to guide or monitor treatment for MRSA infections. Performed at [facility], [place] [address], [place],  [address]      Time coordinating discharge: 35 minutes  SIGNED:   Hosie Poisson, MD  [facility] 10/14/2020, 12:18 PM

## 2020-10-14 NOTE — Progress Notes (Addendum)
Progress Note  Patient Name: Raymond Carney Date of Encounter: 10/14/2020  [facility] Cardiologist: Candee Furbish, MD   Subjective   Denies any chest pain or dyspnea.   Inpatient Medications    Scheduled Meds: . amiodarone  400 mg Oral BID  . apixaban  5 mg Oral BID  . aspirin EC  81 mg Oral Daily  . Chlorhexidine Gluconate Cloth  6 each Topical Daily  . ezetimibe  10 mg Oral Daily  . pantoprazole  40 mg Oral Daily  . rosuvastatin  40 mg Oral Daily  . sodium chloride flush  3 mL Intravenous Q12H  . sodium chloride flush  3 mL Intravenous Q12H   Continuous Infusions: . sodium chloride 10 mL/hr at 10/13/20 0906  . sodium chloride    . azithromycin 500 mg (10/13/20 2324)  . cefTRIAXone (ROCEPHIN)  IV 2 g (10/13/20 2323)   PRN Meds:    Vital Signs    Vitals:   10/13/20 1000 10/13/20 1114 10/13/20 1338 10/14/20 0509  BP: 100/74  107/76 132/66  Pulse: 70  74 70  Resp: 15  18 18   Temp:  98.5 F (36.9 C) 98.5 F (36.9 C) 97.7 F (36.5 C)  TempSrc:  Oral Oral Oral  SpO2: 99%  100% 94%  Weight:   65.6 kg 65.2 kg  Height:   5\' 9"  (1.753 m)     Intake/Output Summary (Last 24 hours) at 10/14/2020 0908 Last data filed at 10/14/2020 0500 Gross per 24 hour  Intake 263.01 ml  Output 650 ml  Net -386.99 ml   Last 3 Weights 10/14/2020 10/13/2020 10/13/2020  Weight (lbs) 143 lb 11.2 oz 144 lb 9.6 oz 150 lb 12.7 oz  Weight (kg) 65.182 kg 65.59 kg 68.4 kg      Telemetry    Normal sinus rhythm, rate 90s- personally Reviewed  ECG    No new EKG- Personally Reviewed  Physical Exam   GEN: No acute distress.   Neck: No JVD Cardiac: RRR, 2/6 systolic murmur Respiratory: Clear to auscultation bilaterally. GI: Soft, nontender, non-distended  MS: No edema; No deformity. Neuro:  Nonfocal  Psych: Normal affect   Labs    High Sensitivity Troponin:   Recent Labs  Lab 10/10/20 2210 10/11/20 0140 10/11/20 1120 10/11/20 1737  TROPONINIHS 637* 5,019* 25,844*  22,131*      Chemistry Recent Labs  Lab 10/10/20 2210 10/10/20 2217 10/12/20 0411 10/12/20 1000 10/12/20 1004 10/13/20 0500 10/14/20 0600  NA 141   < > 136   < > 138 138 138  K 3.8   < > 3.5   < > 3.7 3.2* 3.9  CL 106   < > 102  --   --  101 104  CO2 21*   < > 25  --   --  26 23  GLUCOSE 104*   < > 107*  --   --  99 96  BUN 15   < > 11  --   --  12 8  CREATININE 1.34*   < > 1.04  --   --  1.02 1.05  CALCIUM 8.6*   < > 8.5*  --   --  8.5* 8.7*  PROT 6.5  --   --   --   --   --   --   ALBUMIN 3.2*  --   --   --   --   --   --   AST 52*  --   --   --   --   --   --  ALT 25  --   --   --   --   --   --   ALKPHOS 57  --   --   --   --   --   --   BILITOT 0.5  --   --   --   --   --   --   GFRNONAA >60   < > >60  --   --  >60 >60  ANIONGAP 14   < > 9  --   --  11 11   < > = values in this interval not displayed.     Hematology Recent Labs  Lab 10/13/20 0500 10/13/20 1539 10/14/20 0600  WBC 9.0 9.4 9.3  RBC 3.54* 3.77* 3.69*  HGB 9.6* 10.3* 10.3*  HCT 31.2* 32.7* 31.7*  MCV 88.1 86.7 85.9  MCH 27.1 27.3 27.9  MCHC 30.8 31.5 32.5  RDW 14.7 14.6 14.6  PLT 183 194 211    BNP Recent Labs  Lab 10/10/20 2255  BNP 847.8*     DDimer No results for input(s): DDIMER in the last 168 hours.   Radiology    CARDIAC CATHETERIZATION  Result Date: 10/12/2020  Colon Flattery Cx to Prox Cx lesion is 100% stenosed.  Ramus lesion is 90% stenosed.  Lat Ramus lesion is 70% stenosed.  Prox LAD to Mid LAD lesion is 85% stenosed.  Mid LAD lesion is 85% stenosed.  Mid RCA lesion is 50% stenosed.  Prox RCA-1 lesion is 50% stenosed.  Prox RCA-2 lesion is 35% stenosed.  RPDA lesion is 80% stenosed.  Severe multivessel coronary calcification and coronary obstructive disease with severe calcification with stenoses of 85% in the proximal and mid LAD; 90% near ostial ramus intermediate stenosis with 70% stenosis in the mid vessel, total occlusion of the proximal circumflex with left to left  collaterals; and a large dominant diffusely calcified RCA with moderate luminal irregularity with narrowings of 50%, 30% and 50% in the proximal to mid segment with 80% ostial PDA stenosis. Mild pulmonary venous hypertension with mean PA pressure at 31 mmHg. Echo documentation of EF at 35%. RECOMMENDATION: Surgical consultation for consideration of CABG revascularization surgery.  Aggressive lipid-lowering therapy.  Smoking cessation is essential.    Cardiac Studies   Echo 10/11/20: 1. Left ventricular ejection fraction, by estimation, is 35%. The left  ventricle has moderately decreased function. The left ventricle  demonstrates regional wall motion abnormalities with basal to mid  inferolateral and anterolateral akinesis. There is  mild left ventricular hypertrophy. Left ventricular diastolic parameters  are consistent with Grade II diastolic dysfunction (pseudonormalization).  2. Right ventricular systolic function is normal. The right ventricular  size is normal. There is mildly elevated pulmonary artery systolic  pressure. The estimated right ventricular systolic pressure is 97.9 mmHg.  3. Left atrial size was mildly dilated.  4. The mitral valve is normal in structure. Mild mitral valve  regurgitation. No evidence of mitral stenosis.  5. Tricuspid valve regurgitation is moderate.  6. The aortic valve is tricuspid. Aortic valve regurgitation is not  visualized. Mild aortic valve sclerosis is present, with no evidence of  aortic valve stenosis.  7. The inferior vena cava is normal in size with greater than 50%  respiratory variability, suggesting right atrial pressure of 3 mmHg.   LHC/RHC 10/13/20:   Ost Cx to Prox Cx lesion is 100% stenosed.  Ramus lesion is 90% stenosed.  Lat Ramus lesion is 70% stenosed.  Prox LAD to Mid LAD  lesion is 85% stenosed.  Mid LAD lesion is 85% stenosed.  Mid RCA lesion is 50% stenosed.  Prox RCA-1 lesion is 50% stenosed.  Prox RCA-2  lesion is 35% stenosed.  RPDA lesion is 80% stenosed.   Severe multivessel coronary calcification and coronary obstructive disease with severe calcification with stenoses of 85% in the proximal and mid LAD; 90% near ostial ramus intermediate stenosis with 70% stenosis in the mid vessel, total occlusion of the proximal circumflex with left to left collaterals; and a large dominant diffusely calcified RCA with moderate luminal irregularity with narrowings of 50%, 30% and 50% in the proximal to mid segment with 80% ostial PDA stenosis.  Mild pulmonary venous hypertension with mean PA pressure at 31 mmHg.  Echo documentation of EF at 35%.  RECOMMENDATION: Surgical consultation for consideration of CABG revascularization surgery.  Aggressive lipid-lowering therapy.  Smoking cessation is essential.   Patient Profile     59 y.o. male with a hx of severe PAD, multivessel obstructive CAD, GI bleed, right subclavian stenosis, tobacco use who presented with shock   Assessment & Plan    NSTEMI: Denies any chest pain, but troponin [place] at [address] and new systolic dysfunction on echo (EF 35%) with regional wall motion abnormalities.  Cath on 12/14 showed severe multivessel disease with 85% proximal to mid LAD, 90% ostial ramus, total occlusion of proximal LCx, moderate RCA disease, 80% ostial PDA.  Seen by Dr. Cyndia Bent, high risk CABG candidate, recommend evaluation for PCI. -Discussed with Dr. Cyndia Bent and cardiac surgery: Not a good candidate for CABG given severe PAD/COPD, but could consider if vascular surgery able to do right carotid revascularization at the same time.  Also discussed with Dr. Tamala Julian in interventional cardiology, not a PCI candidate given tortuosity/calcifications of vessels.  Recommend medical management for now, patient not interested in staying for further CABG work-up.  Can follow-up as outpatient for further evaluation. -Continue ASA, statin, Eliquis  Shock:  Suspect cardiogenic in  setting of NSTEMI, troponin peak 25,844. Lactate 3.4 on admission, has normalized.  Now off pressors. Echocardiogram 12/13 shows EF 35% with lateral akinesis.  CVC placed, normal CVP (4) and Co-ox (69%).  Suspect A. fib with RVR on admission was contributing to shock, has improved significantly since converting to sinus rhythm.  Also suspect BP inaccurate in upper extremities due to bilateral subclavian artery stenosis.    A. fib with RVR: Presented with A. fib with RVR, rates 130s.  Converted to sinus rhythm  -Continue Eliquis 5 mg twice daily -Suspect Afib was likely contributing to his shock on presentation, would load with amiodarone to maintain sinus rhythm.  Plan 400mg  BID x 1 week, then decrease to 400 mg daily x1 week, then 200 mg daily  Acute combined systolic and diastolic heart failure: Echo shows EF 35%, new diagnosis.  Ischemic cardiomyopathy from known multivessel CAD as above -Difficult to start on GDMT, as difficulty obtaining blood pressures in upper extremities due to bilateral subclavian artery stenosis as well as severe PAD in lower extremities.  Currently measuring pressures in right leg.  Patient presented with shock (which despite difficulty with measuring BP, was likely truly in shock given elevated lactate) so hesitant to be too aggressive with heart failure meds.  Will start with low-dose Toprol-XL, consider adding losartan as outpatient.  Would be hesitant to start Entresto given difficulty measuring BP  [facility] will sign off.   Medication Recommendations:  Amiodarone 400 mg BID, drop to 400 mg daily on 12/20,  then drop to 200 mg daily on 12/27.  Aspirin 81 mg daily, Eliquis 5 mg BID, rosuvastatin 40 mg daily, zetia 10 mg daily, toprol XL 25 mg daily Other recommendations (labs, testing, etc):  None Follow up as an outpatient: Will schedule.   For questions or updates, please contact [place] Please consult [email or web address] for contact info under         Signed, Donato Heinz, MD  10/14/2020, 9:08 AM

## 2020-10-14 NOTE — Evaluation (Signed)
Physical Therapy Evaluation and Discharge Patient Details Name: Raymond Carney MRN: [id number] DOB: [birth year] Today's Date: 10/14/2020   History of Present Illness  Pt is a 59 y/o male admitted secondary to cardiogenic shock, NSTEMI, a fib, and CHF> PMH includes PAD, COPD, and CVA.  Clinical Impression  Patient evaluated by Physical Therapy with no further acute PT needs identified. All education has been completed and the patient has no further questions. Pt overall at an independent to mod I level with gait and stair navigation. Scored 23 on DGI indicating low fall risk. Reports family lives close by if needed. See below for any follow-up Physical Therapy or equipment needs. PT is signing off. Thank you for this referral. If needs change, please re-consult.      Follow Up Recommendations No PT follow up    Equipment Recommendations  None recommended by PT    Recommendations for Other Services       Precautions / Restrictions Precautions Precautions: None Restrictions Weight Bearing Restrictions: No      Mobility  Bed Mobility Overal bed mobility: Independent                  Transfers Overall transfer level: Independent                  Ambulation/Gait Ambulation/Gait assistance: Independent Gait Distance (Feet): 150 Feet Assistive device: None Gait Pattern/deviations: WFL(Within Functional Limits) Gait velocity: WFL   General Gait Details: Overall steady gait. Able to perform DGI tasks without LOB.  Stairs Stairs: Yes Stairs assistance: Modified independent (Device/Increase time) Stair Management: One rail Right;Alternating pattern;Forwards Number of Stairs: 3 General stair comments: Overall steady stair navigation. No LOB noted.  Wheelchair Mobility    Modified Rankin (Stroke Patients Only)       Balance Overall balance assessment: Independent                               Standardized Balance Assessment Standardized  Balance Assessment : Dynamic Gait Index   Dynamic Gait Index Level Surface: Normal Change in Gait Speed: Normal Gait with Horizontal Head Turns: Normal Gait with Vertical Head Turns: Normal Gait and Pivot Turn: Normal Step Over Obstacle: Normal Step Around Obstacles: Normal Steps: Mild Impairment Total Score: 23       Pertinent Vitals/Pain Pain Assessment: No/denies pain    Home Living Family/patient expects to be discharged to:: Private residence Living Arrangements: Alone Available Help at Discharge: Family;Available PRN/intermittently Type of Home: House Home Access: Stairs to enter Entrance Stairs-Rails: Right;Left;Can reach both Entrance Stairs-Number of Steps: 3 Home Layout: One level Home Equipment: Walker - 2 wheels      Prior Function Level of Independence: Independent               Hand Dominance        Extremity/Trunk Assessment   Upper Extremity Assessment Upper Extremity Assessment: Overall WFL for tasks assessed    Lower Extremity Assessment Lower Extremity Assessment: Overall WFL for tasks assessed    Cervical / Trunk Assessment Cervical / Trunk Assessment: Normal  Communication   Communication: No difficulties  Cognition Arousal/Alertness: Awake/alert Behavior During Therapy: WFL for tasks assessed/performed Overall Cognitive Status: Within Functional Limits for tasks assessed  General Comments      Exercises     Assessment/Plan    PT Assessment Patent does not need any further PT services  PT Problem List         PT Treatment Interventions      PT Goals (Current goals can be found in the Care Plan section)  Acute Rehab PT Goals Patient Stated Goal: to go home PT Goal Formulation: With patient Time For Goal Achievement: 10/14/20 Potential to Achieve Goals: Good    Frequency     Barriers to discharge        Co-evaluation               AM-PAC PT "6  Clicks" Mobility  Outcome Measure Help needed turning from your back to your side while in a flat bed without using bedrails?: None Help needed moving from lying on your back to sitting on the side of a flat bed without using bedrails?: None Help needed moving to and from a bed to a chair (including a wheelchair)?: None Help needed standing up from a chair using your arms (e.g., wheelchair or bedside chair)?: None Help needed to walk in hospital room?: None Help needed climbing 3-5 steps with a railing? : None 6 Click Score: 24    End of Session Equipment Utilized During Treatment: Gait belt Activity Tolerance: Patient tolerated treatment well Patient left: in bed;with call bell/phone within reach Nurse Communication: Mobility status PT Visit Diagnosis: Other abnormalities of gait and mobility (R26.89)    Time: 6314-9702 PT Time Calculation (min) (ACUTE ONLY): 10 min   Charges:   PT Evaluation $PT Eval Low Complexity: 1 Low          Lou Miner, DPT  Acute Rehabilitation Services  Pager: [phone] Office: [phone]  Rudean Hitt 10/14/2020, 11:13 AM

## 2020-10-15 LAB — CULTURE, BLOOD (ROUTINE X 2)
Culture: NO GROWTH
Culture: NO GROWTH
Special Requests: ADEQUATE

## 2020-11-02 ENCOUNTER — Ambulatory Visit: Payer: Medicare Other | Admitting: Cardiology

## 2020-11-17 ENCOUNTER — Telehealth (HOSPITAL_COMMUNITY): Payer: Self-pay | Admitting: Pharmacist

## 2020-11-25 ENCOUNTER — Ambulatory Visit: Payer: Medicare Other | Admitting: Cardiology

## 2020-12-23 ENCOUNTER — Ambulatory Visit: Payer: Medicare Other | Admitting: Cardiology

## 2021-02-04 ENCOUNTER — Ambulatory Visit: Payer: Medicare Other | Admitting: Cardiology

## 2021-02-04 ENCOUNTER — Encounter: Payer: Self-pay | Admitting: Cardiology

## 2021-02-04 ENCOUNTER — Other Ambulatory Visit: Payer: Self-pay

## 2021-02-04 VITALS — HR 64 | Ht 69.0 in | Wt 147.0 lb

## 2021-02-04 DIAGNOSIS — I739 Peripheral vascular disease, unspecified: Secondary | ICD-10-CM

## 2021-02-04 DIAGNOSIS — I2510 Atherosclerotic heart disease of native coronary artery without angina pectoris: Secondary | ICD-10-CM

## 2021-02-04 NOTE — Patient Instructions (Signed)
Medication Instructions:  ?The current medical regimen is effective;  continue present plan and medications. ? ?*If you need a refill on your cardiac medications before your next appointment, please call your pharmacy* ? ?Follow-Up: ?At [facility], you and your health needs are our priority.  As part of our continuing mission to provide you with exceptional heart care, we have created designated Provider Care Teams.  These Care Teams include your primary Cardiologist (physician) and Advanced Practice Providers (APPs -  Physician Assistants and Nurse Practitioners) who all work together to provide you with the care you need, when you need it. ? ?We recommend signing up for the patient portal called "MyChart".  Sign up information is provided on this After Visit Summary.  MyChart is used to connect with patients for Virtual Visits (Telemedicine).  Patients are able to view lab/test results, encounter notes, upcoming appointments, etc.  Non-urgent messages can be sent to your provider as well.   ?To learn more about what you can do with MyChart, go to [email or web address].   ? ?Your next appointment:   ?6 month(s) ? ?The format for your next appointment:   ?In Person ? ?Provider:   ?Makih Skains, MD   ? ?Thank you for choosing [facility]!! ? ? ? ?

## 2021-02-04 NOTE — Progress Notes (Signed)
Cardiology Office Note:    Date:  02/07/2021   ID:  Raymond Carney, DOB [birth year], MRN [id number]  PCP:  Antony Contras, MD  [facility] Cardiologist:  Candee Furbish, MD  [facility] Electrophysiologist:  None   Referring MD: Antony Contras, MD   Here for the follow-up of coronary artery disease  History of Present Illness:    Raymond Carney is a 60 y.o. male with hospitalization December 2020 with type II myocardial infarction in the setting of severe GI bleed anemia (supply demand mismatch/demand ischemia) left heart cath showing CTO of left circumflex and obstructive proximal LAD disease nonobstructive disease in the RCA.  No symptoms of chest pain.  This heart cath was done in the setting of myocardial infarction.  Similar hospitalization in 10/14/2020-A. fib RVR at the time.  Amiodarone helped and convert.  Not felt to be a candidate for CABG or PCI.  Echocardiogram 09/28/2019 showed EF 50 to 55%  He was also seen by Dr. Donnetta Hutching of vascular surgery given his severe peripheral vascular disease.  Left leg discomfort in the setting of severe anemia.  He also has severe right subclavian stenosis, difficult to obtain blood pressure in that arm.  Has severe carotid disease as well, see angiogram  Overall he is still continuing to smoke.  Denies any chest pain, no significant shortness of breath.  Repeat EGD shows healing ulcers.  Reassuring.   Past Medical History:  Diagnosis Date  . Carotid stenosis    [place] 6301:  R 60-10; LICA occluded; prox L subcl occluded; R subcl > 50%  . COPD (chronic obstructive pulmonary disease) ([place])   . History of stroke    Echo 7/16:  EF 60-65  . Hyperlipidemia   . Pituitary tumor    s/p excision ~ 2000  . Stenosis of right subclavian artery ([place])    S/P STENT FOLLOWED BY DR. Leonie Man AND DR. [name]   . TIA (transient ischemic attack)     Past Surgical History:  Procedure Laterality Date  . bilateral hip replacements    .  ESOPHAGOGASTRODUODENOSCOPY (EGD) WITH PROPOFOL N/A 09/29/2019   Procedure: ESOPHAGOGASTRODUODENOSCOPY (EGD) WITH PROPOFOL;  Surgeon: Yetta Flock, MD;  Location: [facility];  Service: Gastroenterology;  Laterality: N/A;  . ESOPHAGOGASTRODUODENOSCOPY (EGD) WITH PROPOFOL N/A 02/18/2020   Procedure: ESOPHAGOGASTRODUODENOSCOPY (EGD) WITH PROPOFOL;  Surgeon: Arta Silence, MD;  Location: [facility];  Service: Endoscopy;  Laterality: N/A;  . HIP ARTHROPLASTY Bilateral    ON DISABILITY SECONDARY   . IR ANGIO INTRA EXTRACRAN SEL COM CAROTID INNOMINATE UNI R MOD SED  07/03/2019  . IR ANGIOGRAM EXTREMITY RIGHT  07/03/2019  . LEFT HEART CATH AND CORONARY ANGIOGRAPHY N/A 09/30/2019   Procedure: LEFT HEART CATH AND CORONARY ANGIOGRAPHY;  Surgeon: Belva Crome, MD;  Location: [place] [facility];  Service: Cardiovascular;  Laterality: N/A;  . OTHER SURGICAL HISTORY  1996   HISTORY OF PITUITARY TUMOR REMOVAL CIRCA  . RADIOLOGY WITH ANESTHESIA N/A 05/26/2015   Procedure: RADIOLOGY WITH ANESTHESIA/ANGIOPLASTY;  Surgeon: Luanne Bras, MD;  Location: [facility];  Service: Radiology;  Laterality: N/A;  . RIGHT/LEFT HEART CATH AND CORONARY ANGIOGRAPHY N/A 10/12/2020   Procedure: RIGHT/LEFT HEART CATH AND CORONARY ANGIOGRAPHY;  Surgeon: Troy Sine, MD;  Location: [place] [facility];  Service: Cardiovascular;  Laterality: N/A;  . TRANSPHENOIDAL / TRANSNASAL HYPOPHYSECTOMY / RESECTION PITUITARY TUMOR      Current Medications: Current Meds  Medication Sig  . acetaminophen (TYLENOL) 325 MG tablet Take 325  mg by mouth every 6 (six) hours as needed (for discomfort).  [name] amiodarone (PACERONE) 200 MG tablet Take 1 tablet (200 mg total) by mouth daily.  [name] amoxicillin-clavulanate (AUGMENTIN) 875-125 MG tablet TAKE 1 TABLET BY MOUTH EVERY 12 (TWELVE) HOURS FOR 2 DAYS.  [name] apixaban (ELIQUIS) 5 MG TABS tablet Take 1 tablet (5 mg total) by mouth 2 (two) times daily.  [name] aspirin EC 81 MG EC tablet Take 1 tablet (81  mg total) by mouth daily. Swallow whole.  . ezetimibe (ZETIA) 10 MG tablet Take 1 tablet (10 mg total) by mouth daily.  . metoprolol succinate (TOPROL-XL) 25 MG 24 hr tablet Take 1 tablet (25 mg total) by mouth daily.  . Multiple Vitamin (MULTIVITAMIN WITH MINERALS) TABS tablet Take 1 tablet by mouth 3 (three) times a week.  . pantoprazole (PROTONIX) 40 MG tablet Take 1 tablet (40 mg total) by mouth 2 (two) times daily. (Patient taking differently: Take 40 mg by mouth in the morning.)  . rosuvastatin (CRESTOR) 40 MG tablet Take 1 tablet (40 mg total) by mouth daily.     Allergies:   Patient has no known allergies.   Social History   Socioeconomic History  . Marital status: Divorced    Spouse name: Not on file  . Number of children: 1  . Years of education: Not on file  . Highest education level: Not on file  Occupational History  . Occupation: Disabled  Tobacco Use  . Smoking status: Current Every Day Smoker    Packs/day: 1.27    Years: 40.00    Pack years: 50.80    Types: Cigarettes    Last attempt to quit: 01/22/2014    Years since quitting: 7.0  . Smokeless tobacco: Never Used  Vaping Use  . Vaping Use: Never used  Substance and Sexual Activity  . Alcohol use: No  . Drug use: Never  . Sexual activity: Yes  Other Topics Concern  . Not on file  Social History Narrative  . Not on file   Social Determinants of Health   Financial Resource Strain: Not on file  Food Insecurity: Not on file  Transportation Needs: Not on file  Physical Activity: Not on file  Stress: Not on file  Social Connections: Not on file     Family History: The patient's family history includes Atrial fibrillation in his mother; CAD in his father and mother; CVA in his father; Colon cancer (age of onset: 23) in his sister; Congestive Heart Failure in his mother; Hypertension in his father and mother; Prostate cancer in his father; Stroke in his father. There is no history of Colon polyps or Liver  cancer.  ROS:   Please see the history of present illness.     All other systems reviewed and are negative.  EKGs/Labs/Other Studies Reviewed:    The following studies were reviewed today:  TTE 09/28/2019 1. Left ventricular ejection fraction, by visual estimation, is 50 to 55%. The left ventricle has normal function. There is moderately increased left ventricular hypertrophy. 2. Left ventricular diastolic parameters are consistent with Grade II diastolic dysfunction (pseudonormalization). 3. Global right ventricle has normal systolic function.The right ventricular size is normal. 4. Left atrial size was normal. 5. Right atrial size was normal. 6. The mitral valve is normal in structure. Mild mitral valve regurgitation. No evidence of mitral stenosis. 7. The tricuspid valve is normal in structure. Tricuspid valve regurgitation is mild. 8. The aortic valve is tricuspid. Aortic valve regurgitation is not  visualized. Mild aortic valve sclerosis without stenosis. 9. The pulmonic valve was normal in structure. Pulmonic valve regurgitation is not visualized. 10. Mildly elevated pulmonary artery systolic pressure. 11. The inferior vena cava is normal in size with greater than 50% respiratory variability, suggesting right atrial pressure of 3 mmHg. 12. Hypokinesis of the inferolateral wall with overall preserved LV function; moderate LVH; grade 2 diastolic dysfunction; mild MR and TR; mildly elevated pulmonary pressure.  LHC 09/30/2019  Severe diffuse three-vessel coronary calcification.   Widely patent left main  Segmental 85% proximal to mid LAD. Mid to distal eccentric 85% LAD.  100% ostial to proximal circumflex. Fills via left to left collaterals.  Severe diffuse proximal to distal right coronary disease with eccentric 75% mid vessel stenosis.  Normal LV size and function. EF 60%. LVEDP is normal.  10/12/20:  Severe multivessel coronary calcification and coronary  obstructive disease with severe calcification with stenoses of 85% in the proximal and mid LAD; 90% near ostial ramus intermediate stenosis with 70% stenosis in the mid vessel, total occlusion of the proximal circumflex with left to left collaterals; and a large dominant diffusely calcified RCA with moderate luminal irregularity with narrowings of 50%, 30% and 50% in the proximal to mid segment with 80% ostial PDA stenosis.  Mild pulmonary venous hypertension with mean PA pressure at 31 mmHg.  Echo documentation of EF at 35%.  RECOMMENDATION: Surgical consultation for consideration of CABG revascularization surgery.  Aggressive lipid-lowering therapy.  Smoking cessation is essential.     Recent Labs: 10/10/2020: ALT 25; B Natriuretic Peptide 847.8 10/12/2020: Magnesium 1.8 10/14/2020: BUN 8; Creatinine, Ser 1.05; Hemoglobin 10.3; Platelets 211; Potassium 3.9; Sodium 138  Recent Lipid Panel    Component Value Date/Time   CHOL 149 10/13/2020 0500   TRIG 73 10/13/2020 0500   HDL 51 10/13/2020 0500   CHOLHDL 2.9 10/13/2020 0500   VLDL 15 10/13/2020 0500   LDLCALC 83 10/13/2020 0500    Physical Exam:    VS:  Pulse 64   Ht 5\' 9"  (1.753 m)   Wt 147 lb (66.7 kg)   SpO2 98%   BMI 21.71 kg/m     Wt Readings from Last 3 Encounters:  02/04/21 147 lb (66.7 kg)  10/14/20 143 lb 11.2 oz (65.2 kg)  04/02/20 134 lb 9.6 oz (61.1 kg)   GEN: Well nourished, well developed, in no acute distress  HEENT: normal  Neck: no JVD, carotid bruits, or masses Cardiac: RRR; 2/6 SM,no rubs, or gallops,no edema  Respiratory:  clear to auscultation bilaterally, normal work of breathing GI: soft, nontender, nondistended, + BS MS: no deformity or atrophy  Skin: warm and dry, no rash Neuro:  Alert and Oriented x 3, Strength and sensation are intact Psych: euthymic mood, full affect   ASSESSMENT:    1. Coronary artery disease involving native coronary artery of native heart without angina pectoris    2. PAD (peripheral artery disease) (HCC)    PLAN:    In order of problems listed above:   Type II myocardial infarction/severe coronary artery disease -Not felt to be a CABG candidate, Dr. Cyndia Bent given severe PAD and COPD.  Was consideration of vascular surgery right carotid at the same time.  Not a PCI candidate.  Recommended medical management.  He was not interested in staying in for further CABG work-up. -Diffuse ST segment depressions noted concerning for global ischemia .  Left heart catheterization showed CTO of the circumflex 85% proximal to mid LAD and nonobstructive  disease in the RCA.  With no symptoms of chest pain and no significant left main disease no PCI was performed.      Crestor was added home Zetia.  LDL 75 recently.  No beta-blocker, no anginal symptoms and prior bradycardia.  Ultimately, he is at high risk for future cardiovascular events.  Challenging to obtain blood pressure because of subclavian stenosis in right arm.  Severe peripheral vascular disease -Seen by Dr. Sherren Mocha Early 09/28/2019 in the hospital setting.  Chronic leg left leg ischemia made worse by prior hypotension in the setting of severe anemia.  He was seen prior to that in July 2019, office note reviewed.   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 He does have extensive common femoral disease as well. His superior mesenteric and celiac arteries are widely patent For now, certainly encourage smoking cessation.  Paroxysmal atrial fibrillation --amiodarone Eliquis. NSR now.  No changes made.  Encouraged him to take his Eliquis twice a day.  Tobacco cessation discussed.  GI bleed/severe anemia -Hemoglobin resolved 13.1 outside labs checked from 10/21/2019.  Also ALT was 17, creatinine 0.85 prior LDL in July was 125. EGD showed nonbleeding ulcers.  Need to be very careful with his Eliquis.  Currently stable without any relapse of  bleeding.     Medication Adjustments/Labs and Tests Ordered: Current medicines are reviewed at length with the patient today.  Concerns regarding medicines are outlined above.  No orders of the defined types were placed in this encounter.  No orders of the defined types were placed in this encounter.   Patient Instructions  Medication Instructions:  The current medical regimen is effective;  continue present plan and medications.  *If you need a refill on your cardiac medications before your next appointment, please call your pharmacy*  Follow-Up: At [facility], you and your health needs are our priority.  As part of our continuing mission to provide you with exceptional heart care, we have created designated Provider Care Teams.  These Care Teams include your primary Cardiologist (physician) and Advanced Practice Providers (APPs -  Physician Assistants and Nurse Practitioners) who all work together to provide you with the care you need, when you need it.  We recommend signing up for the patient portal called "MyChart".  Sign up information is provided on this After Visit Summary.  MyChart is used to connect with patients for Virtual Visits (Telemedicine).  Patients are able to view lab/test results, encounter notes, upcoming appointments, etc.  Non-urgent messages can be sent to your provider as well.   To learn more about what you can do with MyChart, go to [email or web address].    Your next appointment:   6 month(s)  The format for your next appointment:   In Person  Provider:   Candee Furbish, MD   Thank you for choosing [facility]!!        Signed, Candee Furbish, MD  02/07/2021 4:12 PM    [facility]

## 2021-02-16 ENCOUNTER — Telehealth: Payer: Self-pay | Admitting: Physician Assistant

## 2021-02-16 MED ORDER — APIXABAN 5 MG PO TABS: 5.0000 mg | ORAL_TABLET | Freq: Two times a day (BID) | ORAL | 0 refills | Status: DC

## 2021-02-16 NOTE — Telephone Encounter (Signed)
   The patient's sister called the answering service after-hours today requesting refill of Eliquis. Last OV reviewed 02/04/21 from Dr. Marlou Porch, recommended to continue Eliquis at that time. Last Hgb mildly anemic, Cr normal. Tried to call number back but got voicemail which is full. Tried back twice more and finally got sister. She is on [facility] DPR. She confirms patient has done well on med, no bleeding, just ran out and needs to refill today so that he does not miss any doses. Sent in rx for 30 days to [facility] [place]. Dr. Marlou Porch' nurse is out per Epic so will route to triage to assist with normal refill protocol under MD. Sister verbalized understanding and gratitude.  Charlie Pitter, PA-C

## 2021-03-15 ENCOUNTER — Other Ambulatory Visit: Payer: Self-pay | Admitting: Physician Assistant

## 2021-03-15 NOTE — Telephone Encounter (Signed)
Prescription refill request for Eliquis received.  Indication: afib  Last office visit: skains, 02/04/2021 Scr: 1.05, 10/14/2020 Age:  60 yo  Weight: 66.7 kg   Pt is on the correct dose of Eliquis per dosing criteria, prescription refill sent for Eliquis 5mg  BID.

## 2021-03-17 DIAGNOSIS — I2510 Atherosclerotic heart disease of native coronary artery without angina pectoris: Secondary | ICD-10-CM | POA: Diagnosis not present

## 2021-03-17 DIAGNOSIS — K219 Gastro-esophageal reflux disease without esophagitis: Secondary | ICD-10-CM | POA: Diagnosis not present

## 2021-03-17 DIAGNOSIS — I252 Old myocardial infarction: Secondary | ICD-10-CM | POA: Diagnosis not present

## 2021-03-17 DIAGNOSIS — D649 Anemia, unspecified: Secondary | ICD-10-CM | POA: Diagnosis not present

## 2021-03-17 DIAGNOSIS — I739 Peripheral vascular disease, unspecified: Secondary | ICD-10-CM | POA: Diagnosis not present

## 2021-03-17 DIAGNOSIS — I4891 Unspecified atrial fibrillation: Secondary | ICD-10-CM | POA: Diagnosis not present

## 2021-03-17 DIAGNOSIS — Z96643 Presence of artificial hip joint, bilateral: Secondary | ICD-10-CM | POA: Diagnosis not present

## 2021-03-17 DIAGNOSIS — E7800 Pure hypercholesterolemia, unspecified: Secondary | ICD-10-CM | POA: Diagnosis not present

## 2021-03-17 DIAGNOSIS — Z8719 Personal history of other diseases of the digestive system: Secondary | ICD-10-CM | POA: Diagnosis not present

## 2021-03-17 DIAGNOSIS — J449 Chronic obstructive pulmonary disease, unspecified: Secondary | ICD-10-CM | POA: Diagnosis not present

## 2021-03-17 DIAGNOSIS — I771 Stricture of artery: Secondary | ICD-10-CM | POA: Diagnosis not present

## 2021-03-17 DIAGNOSIS — Z8673 Personal history of transient ischemic attack (TIA), and cerebral infarction without residual deficits: Secondary | ICD-10-CM | POA: Diagnosis not present

## 2021-05-13 DIAGNOSIS — I6523 Occlusion and stenosis of bilateral carotid arteries: Secondary | ICD-10-CM | POA: Diagnosis not present

## 2021-05-13 DIAGNOSIS — H47292 Other optic atrophy, left eye: Secondary | ICD-10-CM | POA: Diagnosis not present

## 2021-05-13 DIAGNOSIS — H5203 Hypermetropia, bilateral: Secondary | ICD-10-CM | POA: Diagnosis not present

## 2021-05-13 DIAGNOSIS — H52203 Unspecified astigmatism, bilateral: Secondary | ICD-10-CM | POA: Diagnosis not present

## 2021-05-13 DIAGNOSIS — H524 Presbyopia: Secondary | ICD-10-CM | POA: Diagnosis not present

## 2021-05-13 DIAGNOSIS — H2513 Age-related nuclear cataract, bilateral: Secondary | ICD-10-CM | POA: Diagnosis not present

## 2021-05-13 DIAGNOSIS — H25042 Posterior subcapsular polar age-related cataract, left eye: Secondary | ICD-10-CM | POA: Diagnosis not present

## 2021-05-13 DIAGNOSIS — Z87898 Personal history of other specified conditions: Secondary | ICD-10-CM | POA: Diagnosis not present

## 2021-05-16 DIAGNOSIS — D649 Anemia, unspecified: Secondary | ICD-10-CM | POA: Diagnosis not present

## 2021-06-22 ENCOUNTER — Telehealth: Payer: Self-pay | Admitting: Cardiology

## 2021-06-22 NOTE — Telephone Encounter (Signed)
Okay to fill amiodarone and metoprolol? Last provider to fill medications Hosie Poisson, MD with internal med. Please also advise on If patient should continue eliquis or wait until after colonoscopy.

## 2021-06-22 NOTE — Telephone Encounter (Signed)
Pts sister calling for refills on amiodarone and metoprolol. She states she was at his house helping out and noticed he was completely out of these medications. His pharmacist confirmed he has not had them filled since January. She assumes he has not been taking since April time frame. Dr. Marlou Porch last [place] note stated he wanted to keep him on '200mg'$  Amiodarone, however since he has not been taking these for some time, he will need to be seen in the office before filling. It is also time for his 6 mo follow up.   I was able to schedule him in an acute spot for next week to discuss with Dr. Marlou Porch. Pt and his sister agree with this.   In addition, pt would also like to discuss how long he should hold anticoagulation for an upcoming colonoscopy.

## 2021-06-22 NOTE — Telephone Encounter (Signed)
*  STAT* If patient is at the pharmacy, call can be transferred to refill team.   1. Which medications need to be refilled? (please list name of each medication and dose if known)   amiodarone (PACERONE) 200 MG tablet  metoprolol succinate (TOPROL-XL) 25 MG 24 hr tablet  2. Which pharmacy/location (including street and city if local pharmacy) is medication to be sent to?  [id number] [place], [place] - [id number]   3. Do they need a 30 day or 90 day supply? 355ds    Sister also wants to know if pt should continue on eliquis or wait until after colonoscopy.. please advise

## 2021-06-28 ENCOUNTER — Ambulatory Visit: Payer: Medicare Other | Admitting: Cardiology

## 2021-06-28 NOTE — Progress Notes (Incomplete)
Cardiology Office Note:    Date:  06/28/2021   ID:  Raymond Carney, DOB 03/11/1961, MRN [id number]  PCP:  Antony Contras, MD  [facility] Cardiologist:  Candee Furbish, MD  [facility] Electrophysiologist:  None   Referring MD: Antony Contras, MD   Here for the follow-up of coronary artery disease  History of Present Illness:    Raymond Carney is a 60 y.o. male with hospitalization December 2020 with type II myocardial infarction in the setting of severe GI bleed anemia (supply demand mismatch/demand ischemia) left heart cath showing CTO of left circumflex and obstructive proximal LAD disease nonobstructive disease in the RCA.  No symptoms of chest pain.  This heart cath was done in the setting of myocardial infarction.  Similar hospitalization in 10/14/2020-A. fib RVR at the time.  Amiodarone helped and convert.  Not felt to be a candidate for CABG or PCI.   Echocardiogram 09/28/2019 showed EF 50 to 55%   He was also seen by Dr. Donnetta Hutching of vascular surgery given his severe peripheral vascular disease.  Left leg discomfort in the setting of severe anemia.  He also has severe right subclavian stenosis, difficult to obtain blood pressure in that arm.  Has severe carotid disease as well, see angiogram  Overall he is still continuing to smoke.  Denies any chest pain, no significant shortness of breath.  Repeat EGD shows healing ulcers.  Reassuring.    Past Medical History:  Diagnosis Date   Carotid stenosis    [place] 0000000:  R [id number]; LICA occluded; prox L subcl occluded; R subcl > 50%   COPD (chronic obstructive pulmonary disease) (HCC)    History of stroke    Echo 7/16:  EF 60-65   Hyperlipidemia    Pituitary tumor    s/p excision ~ 2000   Stenosis of right subclavian artery ([place])    S/P STENT FOLLOWED BY DR. Leonie Man AND DR. [name]    TIA (transient ischemic attack)     Past Surgical History:  Procedure Laterality Date   bilateral hip replacements     ESOPHAGOGASTRODUODENOSCOPY  (EGD) WITH PROPOFOL N/A 09/29/2019   Procedure: ESOPHAGOGASTRODUODENOSCOPY (EGD) WITH PROPOFOL;  Surgeon: Yetta Flock, MD;  Location: [place];  Service: Gastroenterology;  Laterality: N/A;   ESOPHAGOGASTRODUODENOSCOPY (EGD) WITH PROPOFOL N/A 02/18/2020   Procedure: ESOPHAGOGASTRODUODENOSCOPY (EGD) WITH PROPOFOL;  Surgeon: Arta Silence, MD;  Location: [facility];  Service: Endoscopy;  Laterality: N/A;   HIP ARTHROPLASTY Bilateral    ON DISABILITY SECONDARY    IR ANGIO INTRA EXTRACRAN SEL COM CAROTID INNOMINATE UNI R MOD SED  07/03/2019   IR ANGIOGRAM EXTREMITY RIGHT  07/03/2019   LEFT HEART CATH AND CORONARY ANGIOGRAPHY N/A 09/30/2019   Procedure: LEFT HEART CATH AND CORONARY ANGIOGRAPHY;  Surgeon: Belva Crome, MD;  Location: [facility];  Service: Cardiovascular;  Laterality: N/A;   OTHER SURGICAL HISTORY  1996   HISTORY OF PITUITARY TUMOR REMOVAL CIRCA   RADIOLOGY WITH ANESTHESIA N/A 05/26/2015   Procedure: RADIOLOGY WITH ANESTHESIA/ANGIOPLASTY;  Surgeon: Luanne Bras, MD;  Location: [place];  Service: Radiology;  Laterality: N/A;   RIGHT/LEFT HEART CATH AND CORONARY ANGIOGRAPHY N/A 10/12/2020   Procedure: RIGHT/LEFT HEART CATH AND CORONARY ANGIOGRAPHY;  Surgeon: Troy Sine, MD;  Location: [facility];  Service: Cardiovascular;  Laterality: N/A;   TRANSPHENOIDAL / TRANSNASAL HYPOPHYSECTOMY / RESECTION PITUITARY TUMOR      Current Medications: No outpatient medications have been marked as taking for the 06/30/21  encounter (Appointment) with Jerline Pain, MD.     Allergies:   Patient has no known allergies.   Social History   Socioeconomic History   Marital status: Divorced    Spouse name: Not on file   Number of children: 1   Years of education: Not on file   Highest education level: Not on file  Occupational History   Occupation: Disabled  Tobacco Use   Smoking status: Every Day    Packs/day: 1.27    Years: 40.00    Pack years: 50.80     Types: Cigarettes    Last attempt to quit: 01/22/2014    Years since quitting: 7.4   Smokeless tobacco: Never  Vaping Use   Vaping Use: Never used  Substance and Sexual Activity   Alcohol use: No   Drug use: Never   Sexual activity: Yes  Other Topics Concern   Not on file  Social History Narrative   Not on file   Social Determinants of Health   Financial Resource Strain: Not on file  Food Insecurity: Not on file  Transportation Needs: Not on file  Physical Activity: Not on file  Stress: Not on file  Social Connections: Not on file     Family History: The patient's family history includes Atrial fibrillation in his mother; CAD in his father and mother; CVA in his father; Colon cancer (age of onset: 15) in his sister; Congestive Heart Failure in his mother; Hypertension in his father and mother; Prostate cancer in his father; Stroke in his father. There is no history of Colon polyps or Liver cancer.  ROS:   Please see the history of present illness.     All other systems reviewed and are negative.  EKGs/Labs/Other Studies Reviewed:    The following studies were reviewed today:  TTE 09/28/2019  1. Left ventricular ejection fraction, by visual estimation, is 50 to 55%. The left ventricle has normal function. There is moderately increased left ventricular hypertrophy.  2. Left ventricular diastolic parameters are consistent with Grade II diastolic dysfunction (pseudonormalization).  3. Global right ventricle has normal systolic function.The right ventricular size is normal.  4. Left atrial size was normal.  5. Right atrial size was normal.  6. The mitral valve is normal in structure. Mild mitral valve regurgitation. No evidence of mitral stenosis.  7. The tricuspid valve is normal in structure. Tricuspid valve regurgitation is mild.  8. The aortic valve is tricuspid. Aortic valve regurgitation is not visualized. Mild aortic valve sclerosis without stenosis.  9. The pulmonic  valve was normal in structure. Pulmonic valve regurgitation is not visualized. 10. Mildly elevated pulmonary artery systolic pressure. 11. The inferior vena cava is normal in size with greater than 50% respiratory variability, suggesting right atrial pressure of 3 mmHg. 12. Hypokinesis of the inferolateral wall with overall preserved LV function; moderate LVH; grade 2 diastolic dysfunction; mild MR and TR; mildly elevated pulmonary pressure.   LHC 09/30/2019 Severe diffuse three-vessel coronary calcification.  Widely patent left main Segmental 85% proximal to mid LAD.  Mid to distal eccentric 85% LAD. 100% ostial to proximal circumflex.  Fills via left to left collaterals. Severe diffuse proximal to distal right coronary disease with eccentric 75% mid vessel stenosis. Normal LV size and function.  EF 60%.  LVEDP is normal.   10/12/20:  Severe multivessel coronary calcification and coronary obstructive disease with severe calcification with stenoses of 85% in the proximal and mid LAD; 90% near ostial ramus intermediate stenosis with  70% stenosis in the mid vessel, total occlusion of the proximal circumflex with left to left collaterals; and a large dominant diffusely calcified RCA with moderate luminal irregularity with narrowings of 50%, 30% and 50% in the proximal to mid segment with 80% ostial PDA stenosis.   Mild pulmonary venous hypertension with mean PA pressure at 31 mmHg.   Echo documentation of EF at 35%.   RECOMMENDATION: Surgical consultation for consideration of CABG revascularization surgery.  Aggressive lipid-lowering therapy.  Smoking cessation is essential.    EKG:     EKG is personally reviewed and interpreted. 06/30/2021: ***   Recent Labs: 10/10/2020: ALT 25; B Natriuretic Peptide 847.8 10/12/2020: Magnesium 1.8 10/14/2020: BUN 8; Creatinine, Ser 1.05; Hemoglobin 10.3; Platelets 211; Potassium 3.9; Sodium 138  Recent Lipid Panel    Component Value Date/Time   CHOL  149 10/13/2020 0500   TRIG 73 10/13/2020 0500   HDL 51 10/13/2020 0500   CHOLHDL 2.9 10/13/2020 0500   VLDL 15 10/13/2020 0500   LDLCALC 83 10/13/2020 0500    Physical Exam:    VS:  There were no vitals taken for this visit.    Wt Readings from Last 3 Encounters:  02/04/21 147 lb (66.7 kg)  10/14/20 143 lb 11.2 oz (65.2 kg)  04/02/20 134 lb 9.6 oz (61.1 kg)     GEN: Well nourished, well developed in no acute distress HEENT: Normal NECK: No JVD; No carotid bruits LYMPHATICS: No lymphadenopathy CARDIAC: RRR, 2/6 systolic murmur***, no rubs, gallops RESPIRATORY:  Clear to auscultation without rales, wheezing or rhonchi  ABDOMEN: Soft, non-tender, non-distended MUSCULOSKELETAL:  No edema; No deformity  SKIN: Warm and dry NEUROLOGIC:  Alert and oriented x 3 PSYCHIATRIC:  Normal affect     ASSESSMENT:    No diagnosis found.  PLAN:    In order of problems listed above: No problem-specific Assessment & Plan notes found for this encounter.    Type II myocardial infarction/severe coronary artery disease -Not felt to be a CABG candidate, Dr. Cyndia Bent given severe PAD and COPD.  Was consideration of vascular surgery right carotid at the same time.  Not a PCI candidate.  Recommended medical management.  He was not interested in staying in for further CABG work-up. -Diffuse ST segment depressions noted concerning for global ischemia .  Left heart catheterization showed CTO of the circumflex 85% proximal to mid LAD and nonobstructive disease in the RCA.  With no symptoms of chest pain and no significant left main disease no PCI was performed.      Crestor was added home Zetia.  LDL 75 recently.  No beta-blocker, no anginal symptoms and prior bradycardia.  Ultimately, he is at high risk for future cardiovascular events.  Challenging to obtain blood pressure because of subclavian stenosis in right arm.   Severe peripheral vascular disease -Seen by Dr. Sherren Mocha Early 09/28/2019 in the hospital  setting.  Chronic leg left leg ischemia made worse by prior hypotension in the setting of severe anemia.  He was seen prior to that in July 2019, office note reviewed.   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  He does have extensive common femoral disease as well.  His superior mesenteric and celiac arteries are widely patent For now, certainly encourage smoking cessation.  Paroxysmal atrial fibrillation --amiodarone Eliquis. NSR now.  No changes made.  Encouraged him to take his Eliquis twice a day.  Tobacco cessation discussed.   GI bleed/severe anemia -Hemoglobin  resolved 13.1 outside labs checked from 10/21/2019.  Also ALT was 17, creatinine 0.85 prior LDL in July was 125. EGD showed nonbleeding ulcers.  Need to be very careful with his Eliquis.  Currently stable without any relapse of bleeding.   Follow-up: ***  Medication Adjustments/Labs and Tests Ordered: Current medicines are reviewed at length with the patient today.  Concerns regarding medicines are outlined above.   No orders of the defined types were placed in this encounter.  No orders of the defined types were placed in this encounter.  There are no Patient Instructions on file for this visit.   I,Mathew Stumpf,acting as a Education administrator for [employer], MD.,have documented all relevant documentation on the behalf of French Lambert, MD,as directed by  Candee Furbish, MD while in the presence of Candee Furbish, MD.  ***  Signed, Madelin Rear  06/28/2021 1:52 PM    [place]

## 2021-06-29 ENCOUNTER — Telehealth: Payer: Self-pay | Admitting: Cardiology

## 2021-06-29 MED ORDER — AMIODARONE HCL 200 MG PO TABS: 200.0000 mg | ORAL_TABLET | Freq: Every day | ORAL | 7 refills | Status: DC

## 2021-06-29 NOTE — Telephone Encounter (Signed)
*  STAT* If patient is at the pharmacy, call can be transferred to refill team.   1. Which medications need to be refilled? (please list name of each medication and dose if known) amiodarone (PACERONE) 200 MG tablet  2. Which pharmacy/location (including street and city if local pharmacy) is medication to be sent to? [id number] [place], [place] - [id number]  3. Do they need a 30 day or 90 day supply? 30 ds

## 2021-06-29 NOTE — Telephone Encounter (Signed)
Pt's medication was sent to his pharmacy as requested. Confirmation received.  

## 2021-06-30 ENCOUNTER — Ambulatory Visit: Payer: Medicare Other | Admitting: Cardiology

## 2021-08-05 DIAGNOSIS — Z800 Family history of malignant neoplasm of digestive organs: Secondary | ICD-10-CM | POA: Diagnosis not present

## 2021-08-05 DIAGNOSIS — Z8711 Personal history of peptic ulcer disease: Secondary | ICD-10-CM | POA: Diagnosis not present

## 2021-08-05 DIAGNOSIS — I4891 Unspecified atrial fibrillation: Secondary | ICD-10-CM | POA: Diagnosis not present

## 2021-08-05 DIAGNOSIS — J449 Chronic obstructive pulmonary disease, unspecified: Secondary | ICD-10-CM | POA: Diagnosis not present

## 2021-08-05 DIAGNOSIS — I771 Stricture of artery: Secondary | ICD-10-CM | POA: Diagnosis not present

## 2021-08-05 DIAGNOSIS — D509 Iron deficiency anemia, unspecified: Secondary | ICD-10-CM | POA: Diagnosis not present

## 2021-08-05 DIAGNOSIS — Z86010 Personal history of colonic polyps: Secondary | ICD-10-CM | POA: Diagnosis not present

## 2021-08-10 ENCOUNTER — Telehealth: Payer: Self-pay | Admitting: *Deleted

## 2021-08-10 NOTE — Telephone Encounter (Signed)
   [facility] Pre-operative Risk Assessment    Patient Name: EQUAN COGBILL  DOB: 1961-10-28 MRN: [id number]  HEARTCARE STAFF:  - IMPORTANT!!!!!! Under Visit Info/Reason for Call, type in Other and utilize the format Clearance MM/DD/YY or Clearance TBD. Do not use dashes or single digits. - Please review there is not already an duplicate clearance open for this procedure. - If request is for dental extraction, please clarify the # of teeth to be extracted. - If the patient is currently at the dentist's office, call Pre-Op Callback Staff (MA/nurse) to input urgent request.  - If the patient is not currently in the dentist office, please route to the Pre-Op pool.  Request for surgical clearance:  What type of surgery is being performed? COLONOSCOPY/ENDOSCOPY  When is this surgery scheduled? 11/22/21  What type of clearance is required (medical clearance vs. Pharmacy clearance to hold med vs. Both)? BOTH  Are there any medications that need to be held prior to surgery and how long? [place] name and name of physician performing surgery? [facility]; DR. Arta Silence  What is the office phone number? [phone]   7.   What is the office fax number? [phone]  8.   Anesthesia type (None, local, MAC, general) ? PROPOFOL   Julaine Hua 08/10/2021, 5:45 PM  _________________________________________________________________   (provider comments below)

## 2021-08-12 NOTE — Telephone Encounter (Signed)
   Primary Cardiologist: Candee Furbish, MD  Chart reviewed as part of pre-operative protocol coverage. Given past medical history and time since last visit, based on ACC/AHA guidelines, Raymond Carney would be at acceptable risk for the planned procedure without further cardiovascular testing.   Patient with diagnosis of A Fib on Eliquis for anticoagulation.     Procedure: COLONOSCOPY/ENDOSCOPY   Date of procedure: 11/22/21     CHA2DS2-VASc Score = 4  This indicates a 4.8% annual risk of stroke. The patient's score is based upon: CHF History: 1 HTN History: 0 Diabetes History: 0 Stroke History: 2 Vascular Disease History: 1 Age Score: 0 Gender Score: 0    CrCl 60 mL/min Platelet count 211K   Patient previously cleared by Dr. Marlou Porch to hold eliquis for 2 days.  Due to history of stroke, recommend patient restart Eliquis as soon as possible.  I will route this recommendation to the requesting party via Epic fax function and remove from pre-op pool.  Please call with questions.  [name] NP-C    08/12/2021, 9:29 AM [place] [address] Office [phone] Fax [phone]

## 2021-08-12 NOTE — Telephone Encounter (Signed)
Patient with diagnosis of A Fib on Eliquis for anticoagulation.    Procedure: COLONOSCOPY/ENDOSCOPY   Date of procedure: 11/22/21   CHA2DS2-VASc Score = 4  This indicates a 4.8% annual risk of stroke. The patient's score is based upon: CHF History: 1 HTN History: 0 Diabetes History: 0 Stroke History: 2 Vascular Disease History: 1 Age Score: 0 Gender Score: 0   CrCl 60 mL/min Platelet count 211K  Patient previously cleared by Dr. Marlou Porch to hold eliquis for 2 days.  Due to history of stroke, recommend patient restart Eliquis as soon as possible.

## 2021-08-29 ENCOUNTER — Institutional Professional Consult (permissible substitution): Payer: Medicare Other | Admitting: Neurology

## 2021-09-07 DIAGNOSIS — Z23 Encounter for immunization: Secondary | ICD-10-CM | POA: Diagnosis not present

## 2021-09-29 DIAGNOSIS — Z1211 Encounter for screening for malignant neoplasm of colon: Secondary | ICD-10-CM | POA: Diagnosis not present

## 2021-09-29 DIAGNOSIS — I252 Old myocardial infarction: Secondary | ICD-10-CM | POA: Diagnosis not present

## 2021-09-29 DIAGNOSIS — I2510 Atherosclerotic heart disease of native coronary artery without angina pectoris: Secondary | ICD-10-CM | POA: Diagnosis not present

## 2021-09-29 DIAGNOSIS — E7800 Pure hypercholesterolemia, unspecified: Secondary | ICD-10-CM | POA: Diagnosis not present

## 2021-09-29 DIAGNOSIS — I4891 Unspecified atrial fibrillation: Secondary | ICD-10-CM | POA: Diagnosis not present

## 2021-09-29 DIAGNOSIS — Z1389 Encounter for screening for other disorder: Secondary | ICD-10-CM | POA: Diagnosis not present

## 2021-09-29 DIAGNOSIS — Z122 Encounter for screening for malignant neoplasm of respiratory organs: Secondary | ICD-10-CM | POA: Diagnosis not present

## 2021-09-29 DIAGNOSIS — D649 Anemia, unspecified: Secondary | ICD-10-CM | POA: Diagnosis not present

## 2021-09-29 DIAGNOSIS — Z0000 Encounter for general adult medical examination without abnormal findings: Secondary | ICD-10-CM | POA: Diagnosis not present

## 2021-09-29 DIAGNOSIS — J449 Chronic obstructive pulmonary disease, unspecified: Secondary | ICD-10-CM | POA: Diagnosis not present

## 2021-10-11 ENCOUNTER — Encounter: Payer: Self-pay | Admitting: Neurology

## 2021-10-11 ENCOUNTER — Telehealth: Payer: Self-pay | Admitting: Neurology

## 2021-10-11 ENCOUNTER — Ambulatory Visit: Payer: Medicare Other | Admitting: Neurology

## 2021-10-11 VITALS — BP 102/68 | HR 56 | Ht 69.0 in | Wt 145.8 lb

## 2021-10-11 DIAGNOSIS — G3184 Mild cognitive impairment, so stated: Secondary | ICD-10-CM

## 2021-10-11 DIAGNOSIS — R413 Other amnesia: Secondary | ICD-10-CM

## 2021-10-11 DIAGNOSIS — Z8673 Personal history of transient ischemic attack (TIA), and cerebral infarction without residual deficits: Secondary | ICD-10-CM

## 2021-10-11 NOTE — Progress Notes (Signed)
[facility] [address] [place]. [place] [address] [phone]       OFFICE CONSULT NOTE  Mr. TAIVEN GREENLEY Date of Birth:  Feb 01, 1961 Medical Record Number:  [id number]   Referring MD: Antony Contras  Reason for Referral: Memory loss  HPI: Mr. Edwina Barth is a 60 year old Caucasian male seen today for office consultation visit for memory loss.  He is accompanied by his sister.  History is obtained from them and review of electronic medical records and referral notes and opossum reviewed pertinent available imaging films in PACS.  He has past medical history of hyperlipidemia, COPD, chronic smoking, stroke and known extracranial intracranial occlusive disease.  Patient has had memory difficulties for the last 2 to 3 years since his mother died.  He has not been able to look after his own needs and his sister has been helping out who lives nearby.  Patient has more difficulty with short-term memory and often repeats himself and has trouble remembering recent information.  He can remember things from the past well.  He often gets confused easily and can misplace objects.  He needs help with his sister who helps with his groceries, shopping, paying his bills and handling his finances.  Patient has poor self-care.  He still smokes greater than 1 pack/day cigarettes despite being told not to do so.  Denies any delusions, hallucinations, agitation or unsafe behavior.  His gait and balance are fine has had no falls or injuries.  On the Mini-Mental status exam testing today scored 23/30.  He denies being depressed.  He has not had any work-up for reversible causes of memory loss or tried any medications to improve his memory.  He does not participate in any activities which are cognitively challenging.  There is no family Struve dementia or memory loss.  He denies any headaches, seizures, loss of consciousness or recurrent stroke or TIA symptoms.  He has history of stroke and on 05/23/2015  when he presented with speech difficulties and right-sided weakness MRI at that time had shown old left subacute occipital infarct and cerebral catheter angiogram during that admission showed right subclavian artery is as well as right vertebral ostial stenosis and chronic left carotid and subclavian occlusions.  He was last seen in the office for follow-up on 12/22/2019 and had done well in the interim without recurrent strokes or TIAs but had continued to smoke.  He presently remains on Eliquis for stroke prevention for diagnosis of A. fib as well as Crestor 40 mg which is tolerating well without muscle aches and pains.  His blood pressure well controlled today it is 102/68.  ROS:   14 system review of systems is positive for memory loss, confusion, disorientation and all other systems negative  PMH:  Past Medical History:  Diagnosis Date   Carotid stenosis    [place] 6811:  R 57-26; LICA occluded; prox L subcl occluded; R subcl > 50%   COPD (chronic obstructive pulmonary disease) ([place])    History of stroke    Echo 7/16:  EF 60-65   Hyperlipidemia    Pituitary tumor    s/p excision ~ 2000   Stenosis of right subclavian artery ([place])    S/P STENT FOLLOWED BY DR. Leonie Man AND DR. [name]    TIA (transient ischemic attack)     Social History:  Social History   Socioeconomic History   Marital status: Divorced    Spouse name: Not on file   Number of children: 1  Years of education: Not on file   Highest education level: Not on file  Occupational History   Occupation: Disabled  Tobacco Use   Smoking status: Every Day    Packs/day: 1.27    Years: 40.00    Pack years: 50.80    Types: Cigarettes    Last attempt to quit: 01/22/2014    Years since quitting: 7.7   Smokeless tobacco: Never  Vaping Use   Vaping Use: Never used  Substance and Sexual Activity   Alcohol use: No   Drug use: Never   Sexual activity: Yes  Other Topics Concern   Not on file  Social History Narrative   Not on  file   Social Determinants of Health   Financial Resource Strain: Not on file  Food Insecurity: Not on file  Transportation Needs: Not on file  Physical Activity: Not on file  Stress: Not on file  Social Connections: Not on file  Intimate Partner Violence: Not on file    Medications:   Current Outpatient Medications on File Prior to Visit  Medication Sig Dispense Refill   acetaminophen (TYLENOL) 325 MG tablet Take 325 mg by mouth every 6 (six) hours as needed (for discomfort).     amiodarone (PACERONE) 200 MG tablet Take 1 tablet (200 mg total) by mouth daily. 30 tablet 7   aspirin EC 81 MG EC tablet Take 1 tablet (81 mg total) by mouth daily. Swallow whole. 30 tablet 11   ELIQUIS 5 MG TABS tablet TAKE 1 TABLET BY MOUTH TWICE A DAY 60 tablet 5   ezetimibe (ZETIA) 10 MG tablet Take 1 tablet (10 mg total) by mouth daily. 30 tablet 1   Multiple Vitamin (MULTIVITAMIN WITH MINERALS) TABS tablet Take 1 tablet by mouth 3 (three) times a week.     pantoprazole (PROTONIX) 40 MG tablet Take 1 tablet (40 mg total) by mouth 2 (two) times daily. (Patient taking differently: Take 40 mg by mouth in the morning.) 60 tablet 2   rosuvastatin (CRESTOR) 40 MG tablet Take 1 tablet (40 mg total) by mouth daily. 30 tablet 1   No current facility-administered medications on file prior to visit.    Allergies:  No Known Allergies  Physical Exam General: Frail cachectic looking middle-aged Caucasian male, seated, in no evident distress Head: head normocephalic and atraumatic.   Neck: supple with soft right carotid, subclavian and vertebral bruits.   Cardiovascular: regular rate and rhythm, no murmurs Musculoskeletal: no deformity Skin:  no rash/petichiae Vascular:  Normal pulses all extremities  Neurologic Exam Mental Status: Awake and fully alert. Oriented to place and time. Recent and remote memory diminished. Attention span, concentration and fund of knowledge poor. Mood and affect appropriate.   Mini-Mental status exam score 23/30 with deficits in attention calculation and recall.  Clock drawing 3/4.  Able to name only 7 animals which can walk on 4 legs.  He was able to copy intersecting pentagons. Cranial Nerves: Fundoscopic exam reveals sharp disc margins. Pupils equal, briskly reactive to light. Extraocular movements full without nystagmus. Visual fields full to confrontation. Hearing intact. Facial sensation intact. Face, tongue, palate moves normally and symmetrically.  Motor: Normal bulk and tone. Normal strength in all tested extremity muscles. Sensory.: intact to touch , pinprick , position and vibratory sensation.  Coordination: Rapid alternating movements normal in all extremities. Finger-to-nose and heel-to-shin performed accurately bilaterally. Gait and Station: Arises from chair without difficulty. Stance is normal. Gait demonstrates normal stride length and balance . Able to  heel, toe and tandem walk with moderate difficulty.  Reflexes: 1+ and symmetric. Toes downgoing.   NIHSS  0 Modified Rankin  1   ASSESSMENT: 60 year old Caucasian male with subacute short-term memory and cognitive difficulties due to mild cognitive impairment.  Remote history of stroke due to significant occlusive extracranial disease with multiple vascular risk factors of smoking, hyperlipidemia, atrial fibrillation occlusive cerebrovascular disease     PLAN:I had a long discussion with the patient and his sister regarding his memory loss and cognitive impairment and discussed differential diagnosis and plan for evaluation and treatment and answered questions.  Recommend we check her memory panel labs, CMP, CBC,EEG and MRI scan of the brain.  He was encouraged to increase participation in cognitively challenging activities like solving crossword puzzles, playing bridge and sodoku.  I also encouraged him to limit his alcohol intake to not more than 1 or 2 drinks per day.  I also encouraged him to quit  smoking and to continue Eliquis for stroke prevention given history of A. fib and maintain aggressive risk factor modification strict control of hypertension with blood pressure goal below 130/90, lipids with LDL cholesterol goal below 70 mg percent and diabetes with hemoglobin A1c goal below 6.5%.  Will return for follow-up in 3 months or call earlier if necessary.  Greater than 50% time during this 45-minute consultation visit was spent on counseling and coordination of care about his memory loss and cognitive impairment as well as remote stroke, occlusive cerebrovascular disease and answering questions. Antony Contras, MD  Note: This document was prepared with digital dictation and possible smart phrase technology. Any transcriptional errors that result from this process are unintentional.

## 2021-10-11 NOTE — Telephone Encounter (Signed)
[employer] medicare order sent to [facility], [facility] they will reach out to the patient to schedule.  

## 2021-10-11 NOTE — Patient Instructions (Addendum)
I had a long discussion with the patient and his sister regarding his memory loss and cognitive impairment and discussed differential diagnosis and plan for evaluation and treatment and answered questions.  Recommend we check her memory panel labs, CMP, CBC,EEG and MRI scan of the brain.  He was encouraged to increase participation in cognitively challenging activities like solving crossword puzzles, playing bridge and sodoku.  I also encouraged him to limit his alcohol intake to not more than 1 or 2 drinks per day.  I also encouraged him to quit smoking and to continue Eliquis for stroke prevention given history of A. fib and maintain aggressive risk factor modification strict control of hypertension with blood pressure goal below 130/90, lipids with LDL cholesterol goal below 70 mg percent and diabetes with hemoglobin A1c goal below 6.5%.  Will return for follow-up in 3 months or call earlier if necessary. Memory Compensation Strategies  Use "WARM" strategy.  W= write it down  A= associate it  R= repeat it  M= make a mental note  2.   You can keep a Social worker.  Use a 3-ring notebook with sections for the following: calendar, important names and phone numbers,  medications, doctors' names/phone numbers, lists/reminders, and a section to journal what you did  each day.   3.    Use a calendar to write appointments down.  4.    Write yourself a schedule for the day.  This can be placed on the calendar or in a separate section of the Memory Notebook.  Keeping a  regular schedule can help memory.  5.    Use medication organizer with sections for each day or morning/evening pills.  You may need help loading it  6.    Keep a basket, or pegboard by the door.  Place items that you need to take out with you in the basket or on the pegboard.  You may also want to  include a message board for reminders.  7.    Use sticky notes.  Place sticky notes with reminders in a place where the task is  performed.  For example: " turn off the  stove" placed by the stove, "lock the door" placed on the door at eye level, " take your medications" on  the bathroom mirror or by the place where you normally take your medications.  8.    Use alarms/timers.  Use while cooking to remind yourself to check on food or as a reminder to take your medicine, or as a  reminder to make a call, or as a reminder to perform another task, etc.

## 2021-10-12 ENCOUNTER — Telehealth: Payer: Self-pay | Admitting: *Deleted

## 2021-10-12 LAB — COMPREHENSIVE METABOLIC PANEL
ALT: 20 IU/L (ref 0–44)
AST: 33 IU/L (ref 0–40)
Albumin/Globulin Ratio: 1.8 (ref 1.2–2.2)
Albumin: 4.6 g/dL (ref 3.8–4.8)
Alkaline Phosphatase: 64 IU/L (ref 44–121)
BUN/Creatinine Ratio: 10 (ref 10–24)
BUN: 14 mg/dL (ref 8–27)
Bilirubin Total: 0.3 mg/dL (ref 0.0–1.2)
CO2: 24 mmol/L (ref 20–29)
Calcium: 9.9 mg/dL (ref 8.6–10.2)
Chloride: 98 mmol/L (ref 96–106)
Creatinine, Ser: 1.42 mg/dL — ABNORMAL HIGH (ref 0.76–1.27)
Globulin, Total: 2.6 g/dL (ref 1.5–4.5)
Glucose: 87 mg/dL (ref 70–99)
Potassium: 4.3 mmol/L (ref 3.5–5.2)
Sodium: 138 mmol/L (ref 134–144)
Total Protein: 7.2 g/dL (ref 6.0–8.5)
eGFR: 56 mL/min/{1.73_m2} — ABNORMAL LOW (ref >59)

## 2021-10-12 LAB — CBC
Hematocrit: 42.6 % (ref 37.5–51.0)
Hemoglobin: 14.7 g/dL (ref 13.0–17.7)
MCH: 30.6 pg (ref 26.6–33.0)
MCHC: 34.5 g/dL (ref 31.5–35.7)
MCV: 89 fL (ref 79–97)
Platelets: 219 10*3/uL (ref 150–450)
RBC: 4.81 x10E6/uL (ref 4.14–5.80)
RDW: 14 % (ref 11.6–15.4)
WBC: 10.2 10*3/uL (ref 3.4–10.8)

## 2021-10-12 LAB — DEMENTIA PANEL
Homocysteine: 19.1 umol/L — ABNORMAL HIGH (ref 0.0–17.2)
RPR Ser Ql: NONREACTIVE
TSH: 2.1 u[IU]/mL (ref 0.450–4.500)
Vitamin B-12: 191 pg/mL — ABNORMAL LOW (ref 232–1245)

## 2021-10-12 NOTE — Progress Notes (Signed)
Kindly inform the patient that vitamin B12 level is low and homocystine is slightly high.  Kindly see primary care physician for vitamin B12 replacement.  Thyroid test and test for syphilis 1 okay.  Blood chemistry suggests slightly impaired kidney function and to seek advice for management of this from primary care physician as well.

## 2021-10-12 NOTE — Telephone Encounter (Signed)
Left a detailed message, with results and plan, on his sister's voicemail (ok per DPR).  Provided our number to call back with any questions.

## 2021-10-12 NOTE — Telephone Encounter (Signed)
-----   Message from Garvin Fila, MD sent at 10/12/2021  5:10 PM EST ----- Kindly inform the patient that vitamin B12 level is low and homocystine is slightly high.  Kindly see primary care physician for vitamin B12 replacement.  Thyroid test and test for syphilis 1 okay.  Blood chemistry suggests slightly impaired kidney function and to seek advice for management of this from primary care physician as well.

## 2021-10-14 DIAGNOSIS — H47292 Other optic atrophy, left eye: Secondary | ICD-10-CM | POA: Diagnosis not present

## 2021-10-14 DIAGNOSIS — H5203 Hypermetropia, bilateral: Secondary | ICD-10-CM | POA: Diagnosis not present

## 2021-10-14 DIAGNOSIS — R569 Unspecified convulsions: Secondary | ICD-10-CM | POA: Diagnosis not present

## 2021-10-14 DIAGNOSIS — H524 Presbyopia: Secondary | ICD-10-CM | POA: Diagnosis not present

## 2021-10-14 DIAGNOSIS — I6523 Occlusion and stenosis of bilateral carotid arteries: Secondary | ICD-10-CM | POA: Diagnosis not present

## 2021-10-14 DIAGNOSIS — Z87898 Personal history of other specified conditions: Secondary | ICD-10-CM | POA: Diagnosis not present

## 2021-10-14 DIAGNOSIS — H52223 Regular astigmatism, bilateral: Secondary | ICD-10-CM | POA: Diagnosis not present

## 2021-10-14 NOTE — Telephone Encounter (Signed)
Pt's sister is asking for a call back from [place], [place] on Monday

## 2021-10-14 NOTE — Telephone Encounter (Signed)
I spoke to the patient's sister. She is aware of the lab results below. He has a pending follow up with this PCP.

## 2021-10-17 ENCOUNTER — Other Ambulatory Visit: Payer: Self-pay | Admitting: *Deleted

## 2021-10-17 DIAGNOSIS — Z87891 Personal history of nicotine dependence: Secondary | ICD-10-CM

## 2021-10-17 DIAGNOSIS — F17210 Nicotine dependence, cigarettes, uncomplicated: Secondary | ICD-10-CM

## 2021-11-07 ENCOUNTER — Ambulatory Visit: Payer: Medicare Other | Admitting: Cardiology

## 2021-11-08 ENCOUNTER — Ambulatory Visit: Payer: Medicare Other | Admitting: Cardiology

## 2021-11-10 ENCOUNTER — Other Ambulatory Visit: Payer: Medicare Other | Admitting: *Deleted

## 2021-11-17 ENCOUNTER — Other Ambulatory Visit: Payer: Self-pay

## 2021-11-17 ENCOUNTER — Ambulatory Visit: Payer: Medicare Other | Admitting: Neurology

## 2021-11-17 DIAGNOSIS — R410 Disorientation, unspecified: Secondary | ICD-10-CM | POA: Diagnosis not present

## 2021-11-17 DIAGNOSIS — R413 Other amnesia: Secondary | ICD-10-CM

## 2021-11-22 ENCOUNTER — Ambulatory Visit
Admission: RE | Admit: 2021-11-22 | Discharge: 2021-11-22 | Disposition: A | Discharge status: Home / Self Care | Payer: Medicare Other | Source: Ambulatory Visit | Attending: Neurology | Admitting: Neurology

## 2021-11-22 ENCOUNTER — Ambulatory Visit
Admission: RE | Admit: 2021-11-22 | Discharge: 2021-11-22 | Disposition: A | Discharge status: Home / Self Care | Payer: Medicare Other | Source: Ambulatory Visit | Attending: Acute Care | Admitting: Acute Care

## 2021-11-22 DIAGNOSIS — I2510 Atherosclerotic heart disease of native coronary artery without angina pectoris: Secondary | ICD-10-CM | POA: Diagnosis not present

## 2021-11-22 DIAGNOSIS — R413 Other amnesia: Secondary | ICD-10-CM | POA: Diagnosis not present

## 2021-11-22 DIAGNOSIS — I700 Atherosclerosis of aorta: Secondary | ICD-10-CM | POA: Diagnosis not present

## 2021-11-22 DIAGNOSIS — F17210 Nicotine dependence, cigarettes, uncomplicated: Secondary | ICD-10-CM

## 2021-11-22 DIAGNOSIS — Z87891 Personal history of nicotine dependence: Secondary | ICD-10-CM | POA: Diagnosis not present

## 2021-11-22 DIAGNOSIS — J439 Emphysema, unspecified: Secondary | ICD-10-CM | POA: Diagnosis not present

## 2021-11-22 MED ORDER — GADOBENATE DIMEGLUMINE 529 MG/ML IV SOLN
13.0000 mL | Freq: Once | INTRAVENOUS | Status: AC | PRN
  Administered 2021-11-22: 13 mL via INTRAVENOUS

## 2021-11-23 ENCOUNTER — Telehealth: Payer: Self-pay | Admitting: Acute Care

## 2021-11-23 NOTE — Telephone Encounter (Signed)
I have called the patient's sister KAREN WALTON(SISTER) HCPOA, and I have given her the results of his low dose CT Chest. His scan ws read as a Lung  RADS 3, nodules that are probably benign findings, short term follow up suggested: includes nodules with a low likelihood of becoming a clinically active cancer. Radiology recommends a 6 month repeat LDCT follow up.  His sister is in agreement with a 6 month follow up low dose CT Chest.  She verbalized understanding and had no further questions at completion of the call.  Langley Gauss, please fax results to PCP and order 6 month follow up low dose CT Chest. Thanks so much

## 2021-11-23 NOTE — Telephone Encounter (Signed)
Noted     Closing encounter

## 2021-11-23 NOTE — Telephone Encounter (Signed)
Call report from [facility] Radiology  IMPRESSION: 1. Lung-RADS 3, probably benign findings. Short-term follow-up in 6 months is recommended with repeat low-dose chest CT without contrast (please use the following order, "CT CHEST LCS NODULE FOLLOW-UP W/O CM"). 2.  Emphysema (ICD10-J43.9) and Aortic Atherosclerosis (ICD10-170.0)   These results will be called to the ordering clinician or representative by the Radiologist Assistant, and communication documented in the PACS or [employer].     Electronically Signed   By: Misty Stanley M.D.   On: 11/23/2021 14:20

## 2021-11-24 ENCOUNTER — Other Ambulatory Visit: Payer: Self-pay

## 2021-11-24 DIAGNOSIS — R911 Solitary pulmonary nodule: Secondary | ICD-10-CM

## 2021-11-24 DIAGNOSIS — Z87891 Personal history of nicotine dependence: Secondary | ICD-10-CM

## 2021-11-24 DIAGNOSIS — F17210 Nicotine dependence, cigarettes, uncomplicated: Secondary | ICD-10-CM

## 2021-11-24 NOTE — Progress Notes (Signed)
Kindly inform the patient that brainwave study was normal.  No evidence of seizure activity noted

## 2021-11-24 NOTE — Telephone Encounter (Signed)
Results/plan notes faxed to PCP and new order placed for 6 month follow up nodule CT chest

## 2021-11-30 NOTE — Progress Notes (Signed)
Kindly inform the patient that her MRI scan of the brain shows presence of several old strokes and blockages of the blood vessels in the brain but no new or worrisome findings

## 2021-12-01 ENCOUNTER — Telehealth: Payer: Self-pay

## 2021-12-01 NOTE — Telephone Encounter (Signed)
Sister, Santiago Glad called back, stated he has upcoming court date for DWI, not stopping for blue light.  Per sister he's drinking and makes bad decisions. She wants records to take to court. She also asked about EEG results, and I advised per Dr Leonie Man: Mitchell Heir inform the patient that brainwave study was normal.  No evidence of seizure activity noted.  I advised we need her [facility] POA papers and for her to sign a release to get records. She stated we have HC POA and she'll come to sign release. She  verbalized understanding, appreciation.

## 2021-12-01 NOTE — Telephone Encounter (Signed)
I called the pt's sister Santiago Glad ( ok per dpr) and advised of result. Advised to call back with any questions/concerns.

## 2021-12-01 NOTE — Telephone Encounter (Signed)
-----   Message from Anda Latina, RN sent at 12/01/2021  7:40 AM EST -----  ----- Message ----- From: Garvin Fila, MD Sent: 11/30/2021   4:16 PM EST To: Gna-Pod 2 Results  Kindly inform the patient that her MRI scan of the brain shows presence of several old strokes and blockages of the blood vessels in the brain but no new or worrisome findings

## 2021-12-08 ENCOUNTER — Telehealth: Payer: Self-pay | Admitting: Neurology

## 2021-12-08 NOTE — Telephone Encounter (Signed)
Last seen in December 2022 with request for 3 month follow up. I called his sister back. Scheduled for next available 02/15/22 at 11am (check-in 10:30am).

## 2021-12-08 NOTE — Telephone Encounter (Signed)
Patients sister is POA and wanting to know if and when he should be scheduled for his next appointment. Please contact her and schedule. Thank you

## 2021-12-16 ENCOUNTER — Other Ambulatory Visit: Payer: Self-pay | Admitting: Cardiology

## 2022-02-15 ENCOUNTER — Ambulatory Visit: Payer: Medicare Other | Admitting: Neurology

## 2022-02-20 ENCOUNTER — Ambulatory Visit: Payer: Medicare Other | Admitting: Cardiology

## 2022-03-05 ENCOUNTER — Other Ambulatory Visit: Payer: Self-pay | Admitting: Physician Assistant

## 2022-03-05 DIAGNOSIS — I480 Paroxysmal atrial fibrillation: Secondary | ICD-10-CM

## 2022-03-06 NOTE — Telephone Encounter (Signed)
Eliquis '5mg'$  refill request received. Patient is 61 years old, weight-66.1kg, Crea-1.42 on 12/13/222, Diagnosis-Afib, and last seen by Dr. Marlou Porch on 02/04/2021 and pending appt on 05/23/2022. Dose is appropriate based on dosing criteria. Will send in refill to requested pharmacy.   ?

## 2022-03-17 ENCOUNTER — Other Ambulatory Visit: Payer: Self-pay | Admitting: Cardiology

## 2022-04-17 ENCOUNTER — Other Ambulatory Visit: Payer: Self-pay

## 2022-05-10 ENCOUNTER — Other Ambulatory Visit: Payer: Self-pay | Admitting: Family Medicine

## 2022-05-10 DIAGNOSIS — Z122 Encounter for screening for malignant neoplasm of respiratory organs: Secondary | ICD-10-CM

## 2022-05-23 ENCOUNTER — Encounter: Payer: Self-pay | Admitting: Cardiology

## 2022-05-23 ENCOUNTER — Ambulatory Visit: Payer: Medicare Other | Admitting: Cardiology

## 2022-05-23 VITALS — BP 110/80 | HR 41 | Ht 69.0 in | Wt 141.0 lb

## 2022-05-23 DIAGNOSIS — I255 Ischemic cardiomyopathy: Secondary | ICD-10-CM

## 2022-05-23 DIAGNOSIS — I5022 Chronic systolic (congestive) heart failure: Secondary | ICD-10-CM

## 2022-05-23 DIAGNOSIS — I480 Paroxysmal atrial fibrillation: Secondary | ICD-10-CM

## 2022-05-23 DIAGNOSIS — I739 Peripheral vascular disease, unspecified: Secondary | ICD-10-CM

## 2022-05-23 DIAGNOSIS — I2510 Atherosclerotic heart disease of native coronary artery without angina pectoris: Secondary | ICD-10-CM

## 2022-05-23 NOTE — Progress Notes (Signed)
Cardiology Office Note:    Date:  05/23/2022   ID:  Raymond Carney, DOB 11/27/1960, MRN [id number]  PCP:  Antony Contras, MD   [facility] Providers Cardiologist:  Candee Furbish, MD     Referring MD: Antony Contras, MD    History of Present Illness:    Raymond Carney is a 61 y.o. male here for the follow-up atrial fibrillation, prior type II myocardial infarction in the setting of severe GI bleed anemia.  December 2020 left heart catheterization-CTO of left circumflex, obstructive proximal LAD disease, nonobstructive RCA disease.  No chest pain.  Not a candidate for CABG or PCI.  2021-A-fib RVR.  Amiodarone helped convert. No longer on. No palps, no bleeding.   Echocardiogram in 2020 showed EF of 50 to 55%  Vascular surgery, Dr. Donnetta Hutching seen because of severe peripheral vascular disease.  Severe right subclavian stenosis as well.  Left leg discomfort.  Severe carotid disease as well.  Smoking history.  No significant chest pain or significant shortness of breath.  Repeat EGD showed healing ulcers.  Overall been doing quite well.  He is not on amiodarone anymore.  This is good.  He does have sinus bradycardia at 41 bpm.  He is asymptomatic with this, no syncope.  He has not had any bleeding on Eliquis.  Prior upper GI.  Any chest pain.  Trying to pull back on smoking.  Past Medical History:  Diagnosis Date   Carotid stenosis    [place] 6606:  R 30-16; LICA occluded; prox L subcl occluded; R subcl > 50%   COPD (chronic obstructive pulmonary disease) (HCC)    History of stroke    Echo 7/16:  EF 60-65   Hyperlipidemia    Pituitary tumor    s/p excision ~ 2000   Stenosis of right subclavian artery ([place])    S/P STENT FOLLOWED BY DR. Leonie Man AND DR. [name]    TIA (transient ischemic attack)     Past Surgical History:  Procedure Laterality Date   bilateral hip replacements     ESOPHAGOGASTRODUODENOSCOPY (EGD) WITH PROPOFOL N/A 09/29/2019   Procedure: ESOPHAGOGASTRODUODENOSCOPY (EGD)  WITH PROPOFOL;  Surgeon: Yetta Flock, MD;  Location: [place];  Service: Gastroenterology;  Laterality: N/A;   ESOPHAGOGASTRODUODENOSCOPY (EGD) WITH PROPOFOL N/A 02/18/2020   Procedure: ESOPHAGOGASTRODUODENOSCOPY (EGD) WITH PROPOFOL;  Surgeon: Arta Silence, MD;  Location: [facility];  Service: Endoscopy;  Laterality: N/A;   HIP ARTHROPLASTY Bilateral    ON DISABILITY SECONDARY    IR ANGIO INTRA EXTRACRAN SEL COM CAROTID INNOMINATE UNI R MOD SED  07/03/2019   IR ANGIOGRAM EXTREMITY RIGHT  07/03/2019   LEFT HEART CATH AND CORONARY ANGIOGRAPHY N/A 09/30/2019   Procedure: LEFT HEART CATH AND CORONARY ANGIOGRAPHY;  Surgeon: Belva Crome, MD;  Location: [place] [facility];  Service: Cardiovascular;  Laterality: N/A;   OTHER SURGICAL HISTORY  1996   HISTORY OF PITUITARY TUMOR REMOVAL CIRCA   RADIOLOGY WITH ANESTHESIA N/A 05/26/2015   Procedure: RADIOLOGY WITH ANESTHESIA/ANGIOPLASTY;  Surgeon: Luanne Bras, MD;  Location: [place];  Service: Radiology;  Laterality: N/A;   RIGHT/LEFT HEART CATH AND CORONARY ANGIOGRAPHY N/A 10/12/2020   Procedure: RIGHT/LEFT HEART CATH AND CORONARY ANGIOGRAPHY;  Surgeon: Troy Sine, MD;  Location: [place] [facility];  Service: Cardiovascular;  Laterality: N/A;   TRANSPHENOIDAL / TRANSNASAL HYPOPHYSECTOMY / RESECTION PITUITARY TUMOR      Current Medications: Current Meds  Medication Sig   acetaminophen (TYLENOL) 325 MG tablet Take 325 mg by  mouth every 6 (six) hours as needed (for discomfort).   apixaban (ELIQUIS) 5 MG TABS tablet TAKE 1 TABLET BY MOUTH TWICE A DAY   aspirin EC 81 MG EC tablet Take 1 tablet (81 mg total) by mouth daily. Swallow whole.   ezetimibe (ZETIA) 10 MG tablet Take 1 tablet (10 mg total) by mouth daily.   Multiple Vitamin (MULTIVITAMIN WITH MINERALS) TABS tablet Take 1 tablet by mouth 3 (three) times a week.   pantoprazole (PROTONIX) 40 MG tablet Take 1 tablet (40 mg total) by mouth 2 (two) times daily.   rosuvastatin  (CRESTOR) 40 MG tablet Take 1 tablet (40 mg total) by mouth daily.   [DISCONTINUED] amiodarone (PACERONE) 200 MG tablet TAKE 1 TABLET BY MOUTH EVERY DAY     Allergies:   Patient has no known allergies.   Social History   Socioeconomic History   Marital status: Divorced    Spouse name: Not on file   Number of children: 1   Years of education: Not on file   Highest education level: Not on file  Occupational History   Occupation: Disabled  Tobacco Use   Smoking status: Every Day    Packs/day: 1.27    Years: 40.00    Total pack years: 50.80    Types: Cigarettes    Last attempt to quit: 01/22/2014    Years since quitting: 8.3   Smokeless tobacco: Never  Vaping Use   Vaping Use: Never used  Substance and Sexual Activity   Alcohol use: Yes   Drug use: Never   Sexual activity: Yes  Other Topics Concern   Not on file  Social History Narrative   Not on file   Social Determinants of Health   Financial Resource Strain: Not on file  Food Insecurity: Not on file  Transportation Needs: Not on file  Physical Activity: Not on file  Stress: Not on file  Social Connections: Not on file     Family History: The patient's family history includes Atrial fibrillation in his mother; CAD in his father and mother; CVA in his father; Colon cancer (age of onset: 82) in his sister; Congestive Heart Failure in his mother; Hypertension in his father and mother; Prostate cancer in his father; Stroke in his father. There is no history of Colon polyps or Liver cancer.  ROS:   Please see the history of present illness.     All other systems reviewed and are negative.  EKGs/Labs/Other Studies Reviewed:    The following studies were reviewed today: Studies as above  ECHO 2021:   1. Left ventricular ejection fraction, by estimation, is 35%. The left  ventricle has moderately decreased function. The left ventricle  demonstrates regional wall motion abnormalities with basal to mid  inferolateral  and anterolateral akinesis. There is  mild left ventricular hypertrophy. Left ventricular diastolic parameters  are consistent with Grade II diastolic dysfunction (pseudonormalization).   2. Right ventricular systolic function is normal. The right ventricular  size is normal. There is mildly elevated pulmonary artery systolic  pressure. The estimated right ventricular systolic pressure is 59.9 mmHg.   3. Left atrial size was mildly dilated.   4. The mitral valve is normal in structure. Mild mitral valve  regurgitation. No evidence of mitral stenosis.   5. Tricuspid valve regurgitation is moderate.   6. The aortic valve is tricuspid. Aortic valve regurgitation is not  visualized. Mild aortic valve sclerosis is present, with no evidence of  aortic valve stenosis.  7. The inferior vena cava is normal in size with greater than 50%  respiratory variability, suggesting right atrial pressure of 3 mmHg  EKG:  EKG is  ordered today.  The ekg ordered today demonstrates sinus bradycardia 41  Recent Labs: 10/11/2021: ALT 20; BUN 14; Creatinine, Ser 1.42; Hemoglobin 14.7; Platelets 219; Potassium 4.3; Sodium 138; TSH 2.100  Recent Lipid Panel    Component Value Date/Time   CHOL 149 10/13/2020 0500   TRIG 73 10/13/2020 0500   HDL 51 10/13/2020 0500   CHOLHDL 2.9 10/13/2020 0500   VLDL 15 10/13/2020 0500   LDLCALC 83 10/13/2020 0500     Risk Assessment/Calculations:              Physical Exam:    VS:  BP 110/80 (BP Location: Left Arm, Patient Position: Sitting, Cuff Size: Normal)   Pulse (!) 41   Ht '5\' 9"'$  (1.753 m)   Wt 141 lb (64 kg)   SpO2 98%   BMI 20.82 kg/m     Wt Readings from Last 3 Encounters:  05/23/22 141 lb (64 kg)  10/11/21 145 lb 12.8 oz (66.1 kg)  02/04/21 147 lb (66.7 kg)     GEN:  Well nourished, well developed in no acute distress HEENT: Normal NECK: No JVD; right greater than left 3+ carotid bruits LYMPHATICS: No lymphadenopathy CARDIAC: RRR, 2/6  systolic murmur, no rubs, gallops RESPIRATORY:  Clear to auscultation without rales, wheezing or rhonchi  ABDOMEN: Soft, non-tender, non-distended MUSCULOSKELETAL:  No edema; No deformity  SKIN: Warm and dry NEUROLOGIC:  Alert and oriented x 3 PSYCHIATRIC:  Normal affect   ASSESSMENT:    1. Coronary artery disease involving native coronary artery of native heart without angina pectoris   2. PAD (peripheral artery disease) (HCC)   3. Paroxysmal atrial fibrillation ([place])   4. Ischemic cardiomyopathy   5. Chronic systolic heart failure (HCC)    PLAN:    In order of problems listed above:  Paroxysmal atrial fibrillation - Current heart rate 41 with sinus bradycardia.  P waves clearly seen.  He is off of amiodarone.  We will go ahead and take off of his list.  Eliquis, encouraged him to take this twice a day.  Peripheral vascular disease - Dr. Donnetta Hutching.  Multiple vascular beds involved.  Angiogram previously done shows occlusion of left common iliac artery reconstitution of external iliac.  Extensive common femoral disease.  Encourage smoking cessation.  Coronary artery disease - As described above.  Medical management.  Ischemic cardiomyopathy - Currently feeling well.  Prior EF 35%.  NYHA class I.  With bradycardia, unable to tolerate beta-blocker.  With elevated creatinine unable to tolerate ARN I or spironolactone.  Seems to be stable.  Hyperlipidemia - Continue with Crestor 40, Zetia 10.  Last LDL 97 HDL 73 triglycerides 88.  LDL goal less than 70.  Tobacco cessation discussed.  He states that he is cut back quite a bit.  Trying to quit.  GI bleed/severe anemia -Prior ulcers upper GI.  Stable.  In 2020, very sick.  Had a type II myocardial infarction in the setting of severe anemia.  Hemoglobin 14.7 creatinine 1.4 at last check.       Medication Adjustments/Labs and Tests Ordered: Current medicines are reviewed at length with the patient today.  Concerns regarding medicines  are outlined above.  Orders Placed This Encounter  Procedures   EKG 12-Lead   No orders of the defined types were placed in this encounter.  Patient Instructions  Medication Instructions:  Your physician recommends that you continue on your current medications as directed. Please refer to the Current Medication list given to you today.  *If you need a refill on your cardiac medications before your next appointment, please call your pharmacy*  Follow-Up: At [facility], you and your health needs are our priority.  As part of our continuing mission to provide you with exceptional heart care, we have created designated Provider Care Teams.  These Care Teams include your primary Cardiologist (physician) and Advanced Practice Providers (APPs -  Physician Assistants and Nurse Practitioners) who all work together to provide you with the care you need, when you need it.  Your next appointment:   6 month(s)  The format for your next appointment:   In Person  Provider:   Robbie Lis, PA-C or Ambrose Pancoast, NP       Important Information About Sugar         Signed, Candee Furbish, MD  05/23/2022 9:45 AM    [place]

## 2022-05-23 NOTE — Patient Instructions (Signed)
Medication Instructions:  Your physician recommends that you continue on your current medications as directed. Please refer to the Current Medication list given to you today.  *If you need a refill on your cardiac medications before your next appointment, please call your pharmacy*  Follow-Up: At [facility], you and your health needs are our priority.  As part of our continuing mission to provide you with exceptional heart care, we have created designated Provider Care Teams.  These Care Teams include your primary Cardiologist (physician) and Advanced Practice Providers (APPs -  Physician Assistants and Nurse Practitioners) who all work together to provide you with the care you need, when you need it.  Your next appointment:   6 month(s)  The format for your next appointment:   In Person  Provider:   Robbie Lis, PA-C or Ambrose Pancoast, NP       Important Information About Sugar

## 2022-06-16 ENCOUNTER — Other Ambulatory Visit: Payer: Self-pay | Admitting: Cardiology

## 2022-06-27 DIAGNOSIS — E7800 Pure hypercholesterolemia, unspecified: Secondary | ICD-10-CM | POA: Diagnosis not present

## 2022-06-27 DIAGNOSIS — I739 Peripheral vascular disease, unspecified: Secondary | ICD-10-CM | POA: Diagnosis not present

## 2022-06-27 DIAGNOSIS — Z96643 Presence of artificial hip joint, bilateral: Secondary | ICD-10-CM | POA: Diagnosis not present

## 2022-06-27 DIAGNOSIS — G3184 Mild cognitive impairment, so stated: Secondary | ICD-10-CM | POA: Diagnosis not present

## 2022-06-27 DIAGNOSIS — I771 Stricture of artery: Secondary | ICD-10-CM | POA: Diagnosis not present

## 2022-06-27 DIAGNOSIS — Z862 Personal history of diseases of the blood and blood-forming organs and certain disorders involving the immune mechanism: Secondary | ICD-10-CM | POA: Diagnosis not present

## 2022-06-27 DIAGNOSIS — I4891 Unspecified atrial fibrillation: Secondary | ICD-10-CM | POA: Diagnosis not present

## 2022-06-27 DIAGNOSIS — D6869 Other thrombophilia: Secondary | ICD-10-CM | POA: Diagnosis not present

## 2022-06-27 DIAGNOSIS — J449 Chronic obstructive pulmonary disease, unspecified: Secondary | ICD-10-CM | POA: Diagnosis not present

## 2022-06-27 DIAGNOSIS — I252 Old myocardial infarction: Secondary | ICD-10-CM | POA: Diagnosis not present

## 2022-06-27 DIAGNOSIS — K219 Gastro-esophageal reflux disease without esophagitis: Secondary | ICD-10-CM | POA: Diagnosis not present

## 2022-06-27 DIAGNOSIS — I2510 Atherosclerotic heart disease of native coronary artery without angina pectoris: Secondary | ICD-10-CM | POA: Diagnosis not present

## 2022-06-30 ENCOUNTER — Other Ambulatory Visit: Payer: Self-pay | Admitting: Cardiology

## 2022-07-03 ENCOUNTER — Other Ambulatory Visit: Payer: Self-pay | Admitting: Cardiology

## 2022-07-05 ENCOUNTER — Other Ambulatory Visit: Payer: Self-pay

## 2022-07-31 ENCOUNTER — Other Ambulatory Visit: Payer: Self-pay

## 2022-07-31 DIAGNOSIS — F17210 Nicotine dependence, cigarettes, uncomplicated: Secondary | ICD-10-CM

## 2022-07-31 DIAGNOSIS — Z87891 Personal history of nicotine dependence: Secondary | ICD-10-CM

## 2022-07-31 DIAGNOSIS — R911 Solitary pulmonary nodule: Secondary | ICD-10-CM

## 2022-08-15 ENCOUNTER — Other Ambulatory Visit: Payer: Medicare Other

## 2022-10-10 ENCOUNTER — Other Ambulatory Visit: Payer: Self-pay | Admitting: Cardiology

## 2022-11-14 DIAGNOSIS — I252 Old myocardial infarction: Secondary | ICD-10-CM | POA: Diagnosis not present

## 2022-11-14 DIAGNOSIS — J449 Chronic obstructive pulmonary disease, unspecified: Secondary | ICD-10-CM | POA: Diagnosis not present

## 2022-11-14 DIAGNOSIS — I771 Stricture of artery: Secondary | ICD-10-CM | POA: Diagnosis not present

## 2022-11-14 DIAGNOSIS — E7800 Pure hypercholesterolemia, unspecified: Secondary | ICD-10-CM | POA: Diagnosis not present

## 2022-11-14 DIAGNOSIS — I2510 Atherosclerotic heart disease of native coronary artery without angina pectoris: Secondary | ICD-10-CM | POA: Diagnosis not present

## 2022-11-14 DIAGNOSIS — I739 Peripheral vascular disease, unspecified: Secondary | ICD-10-CM | POA: Diagnosis not present

## 2022-11-14 DIAGNOSIS — R031 Nonspecific low blood-pressure reading: Secondary | ICD-10-CM | POA: Diagnosis not present

## 2022-11-14 DIAGNOSIS — D6869 Other thrombophilia: Secondary | ICD-10-CM | POA: Diagnosis not present

## 2022-11-14 DIAGNOSIS — I4891 Unspecified atrial fibrillation: Secondary | ICD-10-CM | POA: Diagnosis not present

## 2022-11-14 DIAGNOSIS — G3184 Mild cognitive impairment, so stated: Secondary | ICD-10-CM | POA: Diagnosis not present

## 2022-11-14 DIAGNOSIS — Z0000 Encounter for general adult medical examination without abnormal findings: Secondary | ICD-10-CM | POA: Diagnosis not present

## 2022-11-28 ENCOUNTER — Ambulatory Visit
Admission: RE | Admit: 2022-11-28 | Discharge: 2022-11-28 | Disposition: A | Discharge status: Home / Self Care | Payer: Medicare Other | Source: Ambulatory Visit | Attending: Acute Care | Admitting: Acute Care

## 2022-11-28 DIAGNOSIS — Z87891 Personal history of nicotine dependence: Secondary | ICD-10-CM

## 2022-11-28 DIAGNOSIS — F17210 Nicotine dependence, cigarettes, uncomplicated: Secondary | ICD-10-CM

## 2022-11-28 DIAGNOSIS — R911 Solitary pulmonary nodule: Secondary | ICD-10-CM

## 2022-11-29 ENCOUNTER — Other Ambulatory Visit: Payer: Self-pay

## 2022-11-29 DIAGNOSIS — Z87891 Personal history of nicotine dependence: Secondary | ICD-10-CM

## 2022-11-29 DIAGNOSIS — F17210 Nicotine dependence, cigarettes, uncomplicated: Secondary | ICD-10-CM

## 2022-11-30 ENCOUNTER — Ambulatory Visit: Payer: Medicare Other | Admitting: Physician Assistant

## 2022-12-10 NOTE — Progress Notes (Unsigned)
Cardiology Office Note:    Date:  12/12/2022   ID:  [name], DOB August 23, 1961, MRN [id number]  PCP:  Antony Contras, MD   [facility] Providers Cardiologist:  Candee Furbish, MD     Referring MD: Antony Contras, MD   Chief Complaint: follow-up a fib, HFrEF  History of Present Illness:    AVROHAM TIMMERS is a 62 y.o. male with a hx of atrial fibrillation on chronic anticoagulation, prior type II myocardial infarction in the setting of severe GI bleed anemia, ischemic cardiomyopathy with prior EF 35%, PVD followed by VVS, tobacco abuse, carotid stenosis, COPD, HLD, stroke, pituitary tumor s/p excision 2000, stenosis of rt subclavian artery s/p stent, sinus bradycardia,   He had left frontal MCA stroke July 2016.  He had an occluded left common carotid artery, L subclavian artery and 95% stenosis in R subclavian and ulnar vertebral artery at the time of his stroke.  He underwent R subclavian angioplasty in 04/2015.  December 2020 he had syncope in the setting of hemorrhagic shock with 2 large ulcers and profound anemia. He had diffuse ST depression and ST elevation in aVR concerning for global ischemia.  Troponin peaked around 9000. No chest pain. TEE with low normal EF and WMA.  Left heart catheterization revealed CTO of left circumflex, obstructive proximal LAD disease, nonobstructive RCA disease.  He did not have any chest pain and was determined not to be a candidate for CABG or PCI.  In 2021 he presented to ED with weakness and shortness of breath.  Was noted to be in AF RVR.  He ruled in for NSTEMI with hs trop peak 25,844.  2D echo showed EF 35% with severe wall motion abnormalities, mild LVH and grade 2 diastolic dysfunction, mild MR and mild aortic valve sclerosis but no stenosis.  He underwent cardiac catheterization which showed severe three-vessel coronary artery disease essentially unchanged from cath 1 year prior.  He underwent surgical consultation for consideration of CABG. he  was considered not a good candidate for CABG given severe PAD/COPD but could consider if vascular surgery able to do right carotid revascularization at the same time.  Per Dr. Tamala Julian, interventional cardiologist, not a PCI candidate given tortuosity/calcification of vessels. Medical management advised.  Most recent cardiology clinic visit was 05/23/22 with Dr. Marlou Porch at which time HR 41, but asymptomatic. He continued to smoke but reported he had cut back quite a bit.  GDMT for ICM limited by elevated creatinine unable to tolerate ARNI or spironolactone. BB held 2/2 bradycardia. No cardiac concerns and was advised to return in 6 months for follow-up.   Today, he is here alone for follow-up. Reports he took the bus from [place]. Lost his license 2 years ago for DUI, gets it back in 14 days. Is living in a camper on several acres of land. Does yard work, walks around his land. Recent rib fractures from tripping over his cat. Family had a 100 acre farm but they are fighting over it since his mother died. His father is 66. Has 2 beers daily, a few cigarettes but has almost quit smoking completely. Daughter rides horses, will see her in a rodeo soon. He denies chest pain, shortness of breath, lower extremity edema, fatigue, palpitations, melena, hematuria, hemoptysis, diaphoresis, weakness, presyncope, syncope, orthopnea, and PND.   Past Medical History:  Diagnosis Date   Carotid stenosis    [place] 0000000:  R [id number]; LICA occluded; prox L subcl occluded; R subcl > 50%  COPD (chronic obstructive pulmonary disease) (HCC)    History of stroke    Echo 7/16:  EF 60-65   Hyperlipidemia    Pituitary tumor    s/p excision ~ 2000   Stenosis of right subclavian artery ([place])    S/P STENT FOLLOWED BY DR. Leonie Man AND DR. [name]    TIA (transient ischemic attack)     Past Surgical History:  Procedure Laterality Date   bilateral hip replacements     ESOPHAGOGASTRODUODENOSCOPY (EGD) WITH PROPOFOL N/A 09/29/2019    Procedure: ESOPHAGOGASTRODUODENOSCOPY (EGD) WITH PROPOFOL;  Surgeon: Yetta Flock, MD;  Location: [place];  Service: Gastroenterology;  Laterality: N/A;   ESOPHAGOGASTRODUODENOSCOPY (EGD) WITH PROPOFOL N/A 02/18/2020   Procedure: ESOPHAGOGASTRODUODENOSCOPY (EGD) WITH PROPOFOL;  Surgeon: Arta Silence, MD;  Location: [facility];  Service: Endoscopy;  Laterality: N/A;   HIP ARTHROPLASTY Bilateral    ON DISABILITY SECONDARY    IR ANGIO INTRA EXTRACRAN SEL COM CAROTID INNOMINATE UNI R MOD SED  07/03/2019   IR ANGIOGRAM EXTREMITY RIGHT  07/03/2019   LEFT HEART CATH AND CORONARY ANGIOGRAPHY N/A 09/30/2019   Procedure: LEFT HEART CATH AND CORONARY ANGIOGRAPHY;  Surgeon: Belva Crome, MD;  Location: [place] [facility];  Service: Cardiovascular;  Laterality: N/A;   OTHER SURGICAL HISTORY  1996   HISTORY OF PITUITARY TUMOR REMOVAL CIRCA   RADIOLOGY WITH ANESTHESIA N/A 05/26/2015   Procedure: RADIOLOGY WITH ANESTHESIA/ANGIOPLASTY;  Surgeon: Luanne Bras, MD;  Location: [place];  Service: Radiology;  Laterality: N/A;   RIGHT/LEFT HEART CATH AND CORONARY ANGIOGRAPHY N/A 10/12/2020   Procedure: RIGHT/LEFT HEART CATH AND CORONARY ANGIOGRAPHY;  Surgeon: Troy Sine, MD;  Location: [place] [facility];  Service: Cardiovascular;  Laterality: N/A;   TRANSPHENOIDAL / TRANSNASAL HYPOPHYSECTOMY / RESECTION PITUITARY TUMOR      Current Medications: Current Meds  Medication Sig   acetaminophen (TYLENOL) 325 MG tablet Take 325 mg by mouth every 6 (six) hours as needed (for discomfort).   apixaban (ELIQUIS) 5 MG TABS tablet TAKE 1 TABLET BY MOUTH TWICE A DAY   aspirin EC 81 MG EC tablet Take 1 tablet (81 mg total) by mouth daily. Swallow whole.   ezetimibe (ZETIA) 10 MG tablet Take 1 tablet (10 mg total) by mouth daily.   Multiple Vitamin (MULTIVITAMIN WITH MINERALS) TABS tablet Take 1 tablet by mouth 3 (three) times a week.   pantoprazole (PROTONIX) 40 MG tablet Take 1 tablet (40 mg total) by  mouth 2 (two) times daily.   rosuvastatin (CRESTOR) 20 MG tablet Take 20 mg by mouth daily.     Allergies:   Patient has no known allergies.   Social History   Socioeconomic History   Marital status: Divorced    Spouse name: Not on file   Number of children: 1   Years of education: Not on file   Highest education level: Not on file  Occupational History   Occupation: Disabled  Tobacco Use   Smoking status: Every Day    Packs/day: 1.27    Years: 40.00    Total pack years: 50.80    Types: Cigarettes    Last attempt to quit: 01/22/2014    Years since quitting: 8.8   Smokeless tobacco: Never  Vaping Use   Vaping Use: Never used  Substance and Sexual Activity   Alcohol use: Yes   Drug use: Never   Sexual activity: Yes  Other Topics Concern   Not on file  Social History Narrative   Not on file  Social Determinants of Health   Financial Resource Strain: Not on file  Food Insecurity: Not on file  Transportation Needs: Not on file  Physical Activity: Not on file  Stress: Not on file  Social Connections: Not on file     Family History: The patient's family history includes Atrial fibrillation in his mother; CAD in his father and mother; CVA in his father; Colon cancer (age of onset: 14) in his sister; Congestive Heart Failure in his mother; Hypertension in his father and mother; Prostate cancer in his father; Stroke in his father. There is no history of Colon polyps or Liver cancer.  ROS:   Please see the history of present illness.   All other systems reviewed and are negative.  Labs/Other Studies Reviewed:    The following studies were reviewed today:  [facility] 10/12/20 Ost Cx to Prox Cx lesion is 100% stenosed. Ramus lesion is 90% stenosed. Lat Ramus lesion is 70% stenosed. Prox LAD to Mid LAD lesion is 85% stenosed. Mid LAD lesion is 85% stenosed. Mid RCA lesion is 50% stenosed. Prox RCA-1 lesion is 50% stenosed. Prox RCA-2 lesion is 35% stenosed. RPDA lesion  is 80% stenosed.   Severe multivessel coronary calcification and coronary obstructive disease with severe calcification with stenoses of 85% in the proximal and mid LAD; 90% near ostial ramus intermediate stenosis with 70% stenosis in the mid vessel, total occlusion of the proximal circumflex with left to left collaterals; and a large dominant diffusely calcified RCA with moderate luminal irregularity with narrowings of 50%, 30% and 50% in the proximal to mid segment with 80% ostial PDA stenosis.   Mild pulmonary venous hypertension with mean PA pressure at 31 mmHg.   Echo documentation of EF at 35%.   RECOMMENDATION: Surgical consultation for consideration of CABG revascularization surgery.  Aggressive lipid-lowering therapy.  Smoking cessation is essential.  Echo 10/11/20 1. Left ventricular ejection fraction, by estimation, is 35%. The left  ventricle has moderately decreased function. The left ventricle  demonstrates regional wall motion abnormalities with basal to mid  inferolateral and anterolateral akinesis. There is  mild left ventricular hypertrophy. Left ventricular diastolic parameters  are consistent with Grade II diastolic dysfunction (pseudonormalization).   2. Right ventricular systolic function is normal. The right ventricular  size is normal. There is mildly elevated pulmonary artery systolic  pressure. The estimated right ventricular systolic pressure is [id number] mmHg.   3. Left atrial size was mildly dilated.   4. The mitral valve is normal in structure. Mild mitral valve  regurgitation. No evidence of mitral stenosis.   5. Tricuspid valve regurgitation is moderate.   6. The aortic valve is tricuspid. Aortic valve regurgitation is not  visualized. Mild aortic valve sclerosis is present, with no evidence of  aortic valve stenosis.   7. The inferior vena cava is normal in size with greater than 50%  respiratory variability, suggesting right atrial pressure of 3  mmHg.  Carotid Duplex 12/29/19 Summary:  Right Carotid: The ECA appears >50% stenosed. ICA 80-99% stenosis based on                 systolic velocity, A999333 based on diastolic velocity.   Left Carotid: Evidence consistent with a total occlusion of the left ICA.  The               CCA appears occluded. The ECA appears occluded.   Vertebrals:  Bilateral vertebral arteries demonstrate antegrade flow.  Subclavians: Right subclavian artery was stenotic. Monophasic left  subclavian              artery.   When compared to prior study, disease appears to have progressed in right  ICA and ECA. Left vertebral artery appears to be recanalized. Left ECA is  newly occluded.   *See table(s) above for measurements and observations.     Recent Labs: From PCP 11/15/22 Creatinine 0.98, Na 131, K 4.9 AST 54, ALT 35 Hgb 14.1. Plt 189  Recent Lipid Panel LDL 70, HDL 106, trigs 58   Risk Assessment/Calculations:    CHA2DS2-VASc Score = 5  {This indicates a 7.2% annual risk of stroke. The patient's score is based upon: CHF History: 1 HTN History: 1 Diabetes History: 0 Stroke History: 2 Vascular Disease History: 1 Age Score: 0 Gender Score: 0    Physical Exam:    VS:  BP 110/70   Pulse 81   Ht 5' 8"$  (1.727 m)   Wt 150 lb 6.4 oz (68.2 kg)   SpO2 98%   BMI 22.87 kg/m     Wt Readings from Last 3 Encounters:  12/12/22 150 lb 6.4 oz (68.2 kg)  05/23/22 141 lb (64 kg)  10/11/21 145 lb 12.8 oz (66.1 kg)     GEN:  Well nourished, well developed in no acute distress HEENT: Normal NECK: No JVD; No carotid bruits CARDIAC: RRR, no murmurs, rubs, gallops RESPIRATORY:  Clear to auscultation without rales, wheezing or rhonchi  ABDOMEN: Soft, non-tender, non-distended MUSCULOSKELETAL:  No edema; No deformity. 2+  pedal pulses, equal bilaterally SKIN: Warm and dry NEUROLOGIC:  Alert and oriented x 3 PSYCHIATRIC:  Normal affect   EKG:  EKG is not ordered today.    Diagnoses:    1.  Ischemic cardiomyopathy   2. Chronic systolic heart failure ([place])   3. Coronary artery disease involving native coronary artery of native heart without angina pectoris   4. Paroxysmal atrial fibrillation (HCC)   5. Chronic anticoagulation   6. Sinus bradycardia   7. Hyperlipidemia LDL goal <70    Assessment and Plan:     CAD without angina: Left heart catheterization 09/2020 revealed CTO of left circumflex, obstructive proximal LAD disease, nonobstructive RCA disease. He did not have any chest pain and was determined not to be a candidate for CABG or PCI. He denies chest pain, dyspnea, or other symptoms concerning for angina.  No indication for further ischemic evaluation at this time. No bleeding concerns. LDL is well controlled. Continue aspirin, ezetimibe, rosuvastatin.  ICM/Chronic HFrEF: LVEF 35%, G2DD on echo 10/11/2020. Appears euvolemic on exam. Denies dyspnea, orthopnea, PND, edema. GDMT has been limited by worsening renal function, hypotension, noncompliance. He does not know what medications he takes, "I just take them."  He is currently asymptomatic so I will not try to initiate GDMT today. I am concerned about his compliance with follow-up. In the future, maybe we can discuss care with his family.   PAF on chronic anticoagulation: HR is well controlled. He is no longer on rate controlling or anti-arrhythmic medication. He is asymptomatic.  Per Dr. Harvie Bridge note, consider reducing amiodarone, however patient has been off amiodarone for some time (per notes from ov with Dr. Marlou Porch on 05/23/22).  No bleeding concerns. Continue Eliquis 5 mg twice daily which is appropriate dose.  Hyperlipidemia LDL goal < 70: LDL 70 on 11/14/22. Continue ezetimibe and rosuvastatin.  Sinus bradycardia: History of bradycardia. Not currently on AV nodal blocking agents. HR is stable at 81 bpm.  Disposition: 6 months with Dr. Marlou Porch  Medication Adjustments/Labs and Tests Ordered: Current medicines are  reviewed at length with the patient today.  Concerns regarding medicines are outlined above.  No orders of the defined types were placed in this encounter.  No orders of the defined types were placed in this encounter.   Patient Instructions  Medication Instructions:  Your physician recommends that you continue on your current medications as directed. Please refer to the Current Medication list given to you today. *If you need a refill on your cardiac medications before your next appointment, please call your pharmacy*   Lab Work: None Ordered   Testing/Procedures: None ordered   Follow-Up: At [facility], you and your health needs are our priority.  As part of our continuing mission to provide you with exceptional heart care, we have created designated Provider Care Teams.  These Care Teams include your primary Cardiologist (physician) and Advanced Practice Providers (APPs -  Physician Assistants and Nurse Practitioners) who all work together to provide you with the care you need, when you need it.  We recommend signing up for the patient portal called "MyChart".  Sign up information is provided on this After Visit Summary.  MyChart is used to connect with patients for Virtual Visits (Telemedicine).  Patients are able to view lab/test results, encounter notes, upcoming appointments, etc.  Non-urgent messages can be sent to your provider as well.   To learn more about what you can do with MyChart, go to [email or web address].    Your next appointment:   6 month(s)  Provider:   Candee Furbish, MD     Other Instructions Heart Failure Education: Weigh yourself EVERY morning after you go to the bathroom but before you eat or drink anything. Write this number down in a weight log/diary. If you gain 3 pounds overnight or 5 pounds in a week, call the office. Take your medicines as prescribed. If you have concerns about your medications, please call us before you stop taking them.   Eat low salt foods--Limit salt (sodium) to 2000 mg per day. This will help prevent your body from holding onto fluid. Read food labels as many processed foods have a lot of sodium, especially canned goods and prepackaged meats. If you would like some assistance choosing low sodium foods, we would be happy to set you up with a nutritionist. Stay as active as you can everyday. Staying active will give you more energy and make your muscles stronger. Start with 5 minutes at a time and work your way up to 30 minutes a day. Break up your activities--do some in the morning and some in the afternoon. Start with 3 days per week and work your way up to 5 days as you can.  If you have chest pain, feel short of breath, dizzy, or lightheaded, STOP. If you don't feel better after a short rest, call 911. If you do feel better, call the office to let us know you have symptoms with exercise. Limit all fluids for the day to less than 2 liters. Fluid includes all drinks, coffee, juice, ice chips, soup, jello, and all other liquids.    Signed, Emmaline Life, NP  12/12/2022 10:24 AM    [place]

## 2022-12-12 ENCOUNTER — Ambulatory Visit: Payer: Medicare Other | Attending: Physician Assistant | Admitting: Nurse Practitioner

## 2022-12-12 ENCOUNTER — Encounter: Payer: Self-pay | Admitting: Nurse Practitioner

## 2022-12-12 VITALS — BP 110/70 | HR 81 | Ht 68.0 in | Wt 150.4 lb

## 2022-12-12 DIAGNOSIS — I480 Paroxysmal atrial fibrillation: Secondary | ICD-10-CM

## 2022-12-12 DIAGNOSIS — Z7901 Long term (current) use of anticoagulants: Secondary | ICD-10-CM | POA: Diagnosis not present

## 2022-12-12 DIAGNOSIS — I5022 Chronic systolic (congestive) heart failure: Secondary | ICD-10-CM

## 2022-12-12 DIAGNOSIS — R001 Bradycardia, unspecified: Secondary | ICD-10-CM | POA: Diagnosis not present

## 2022-12-12 DIAGNOSIS — I2510 Atherosclerotic heart disease of native coronary artery without angina pectoris: Secondary | ICD-10-CM | POA: Diagnosis not present

## 2022-12-12 DIAGNOSIS — I255 Ischemic cardiomyopathy: Secondary | ICD-10-CM

## 2022-12-12 DIAGNOSIS — E785 Hyperlipidemia, unspecified: Secondary | ICD-10-CM | POA: Diagnosis not present

## 2022-12-12 NOTE — Patient Instructions (Addendum)
Medication Instructions:  Your physician recommends that you continue on your current medications as directed. Please refer to the Current Medication list given to you today. *If you need a refill on your cardiac medications before your next appointment, please call your pharmacy*   Lab Work: None Ordered   Testing/Procedures: None ordered   Follow-Up: At [facility], you and your health needs are our priority.  As part of our continuing mission to provide you with exceptional heart care, we have created designated Provider Care Teams.  These Care Teams include your primary Cardiologist (physician) and Advanced Practice Providers (APPs -  Physician Assistants and Nurse Practitioners) who all work together to provide you with the care you need, when you need it.  We recommend signing up for the patient portal called "MyChart".  Sign up information is provided on this After Visit Summary.  MyChart is used to connect with patients for Virtual Visits (Telemedicine).  Patients are able to view lab/test results, encounter notes, upcoming appointments, etc.  Non-urgent messages can be sent to your provider as well.   To learn more about what you can do with MyChart, go to [email or web address].    Your next appointment:   6 month(s)  Provider:   Candee Furbish, MD     Other Instructions Heart Failure Education: Weigh yourself EVERY morning after you go to the bathroom but before you eat or drink anything. Write this number down in a weight log/diary. If you gain 3 pounds overnight or 5 pounds in a week, call the office. Take your medicines as prescribed. If you have concerns about your medications, please call us before you stop taking them.  Eat low salt foods--Limit salt (sodium) to 2000 mg per day. This will help prevent your body from holding onto fluid. Read food labels as many processed foods have a lot of sodium, especially canned goods and prepackaged meats. If you would like  some assistance choosing low sodium foods, we would be happy to set you up with a nutritionist. Stay as active as you can everyday. Staying active will give you more energy and make your muscles stronger. Start with 5 minutes at a time and work your way up to 30 minutes a day. Break up your activities--do some in the morning and some in the afternoon. Start with 3 days per week and work your way up to 5 days as you can.  If you have chest pain, feel short of breath, dizzy, or lightheaded, STOP. If you don't feel better after a short rest, call 911. If you do feel better, call the office to let us know you have symptoms with exercise. Limit all fluids for the day to less than 2 liters. Fluid includes all drinks, coffee, juice, ice chips, soup, jello, and all other liquids.

## 2023-01-02 ENCOUNTER — Encounter: Payer: Self-pay | Admitting: Neurology

## 2023-01-02 ENCOUNTER — Ambulatory Visit: Payer: Medicare Other | Admitting: Neurology

## 2023-01-02 VITALS — BP 130/89 | HR 73 | Ht 68.0 in | Wt 151.6 lb

## 2023-01-02 DIAGNOSIS — G3184 Mild cognitive impairment, so stated: Secondary | ICD-10-CM

## 2023-01-02 DIAGNOSIS — E538 Deficiency of other specified B group vitamins: Secondary | ICD-10-CM

## 2023-01-02 DIAGNOSIS — R413 Other amnesia: Secondary | ICD-10-CM

## 2023-01-02 MED ORDER — THIAMINE HCL 100 MG PO TABS: 100.0000 mg | ORAL_TABLET | Freq: Every day | ORAL | 1 refills | Status: DC

## 2023-01-02 MED ORDER — VITAMIN B 12 500 MCG PO TABS: 1.0000 | ORAL_TABLET | Freq: Every day | ORAL | 3 refills | Status: DC

## 2023-01-02 NOTE — Progress Notes (Signed)
[facility] [address] [place]. [place] [address] [phone]       OFFICE FOLLOW-UP VISIT NOTE  [name] Date of Birth:  1960/11/22 Medical Record Number:  [id number]   Referring MD: Antony Contras  Reason for Referral: Memory loss  HPI: Initial visit 10/20/2021 [name] is a 62 year old Caucasian male seen today for office consultation visit for memory loss.  He is accompanied by his sister.  History is obtained from them and review of electronic medical records and referral notes and opossum reviewed pertinent available imaging films in PACS.  He has past medical history of hyperlipidemia, COPD, chronic smoking, stroke and known extracranial intracranial occlusive disease.  Patient has had memory difficulties for the last 2 to 3 years since his mother died.  He has not been able to look after his own needs and his sister has been helping out who lives nearby.  Patient has more difficulty with short-term memory and often repeats himself and has trouble remembering recent information.  He can remember things from the past well.  He often gets confused easily and can misplace objects.  He needs help with his sister who helps with his groceries, shopping, paying his bills and handling his finances.  Patient has poor self-care.  He still smokes greater than 1 pack/day cigarettes despite being told not to do so.  Denies any delusions, hallucinations, agitation or unsafe behavior.  His gait and balance are fine has had no falls or injuries.  On the Mini-Mental status exam testing today scored 23/30.  He denies being depressed.  He has not had any work-up for reversible causes of memory loss or tried any medications to improve his memory.  He does not participate in any activities which are cognitively challenging.  There is no family Struve dementia or memory loss.  He denies any headaches, seizures, loss of consciousness or recurrent stroke or TIA symptoms.  He has  history of stroke and on 05/23/2015 when he presented with speech difficulties and right-sided weakness MRI at that time had shown old left subacute occipital infarct and cerebral catheter angiogram during that admission showed right subclavian artery is as well as right vertebral ostial stenosis and chronic left carotid and subclavian occlusions.  He was last seen in the office for follow-up on 12/22/2019 and had done well in the interim without recurrent strokes or TIAs but had continued to smoke.  He presently remains on Eliquis for stroke prevention for diagnosis of A. fib as well as Crestor 40 mg which is tolerating well without muscle aches and pains.  His blood pressure well controlled today it is 102/68. Update 01/02/2023 : Patient is referred back to see me today by Dr. Moreen Fowler for memory loss or cognitive concerns.  He is accompanied by his sister who provides a lot of the history.  Patient last saw me in December 2022 but did not follow-up after that.  I had seen him for the same concerns and ordered lab work for reversible causes which had shown low vitamin B12 at 191 and increased homocystine at 19.1.  I have advised the patient to see primary care physician to start B12 injections but he clearly did not do that.  He had EEG on 11/21/2021 which was normal.  MRI scan of the brain on 11/18/2021 showed old left temporal and occipital infarct and left greater than right cerebellar infarcts.  There is evidence of chronic left ICA occlusion.  Patient states that he has done  well since then.  He still has mild memory difficulties but these appear to be unchanged.  He has been drinking a lot and had 1 DWI and lost his driver's license.  Knowledgeable to try to get it back but his sister has concerns as patient will spend all his money drinking beer and smoking.  His sister handles the finances.  He is still drinking 6-8 beers a day and smokes a lot.  On Mini-Mental status score testing today scored 22/30 which is not  significantly changed from last visit but he scored 23/30.  Last lab work on 11/18/2022 showed normal TSH, CBC and CMP.  LDL cholesterol was 70 mg percent. ROS:   14 system review of systems is positive for memory loss, confusion, disorientation and all other systems negative  PMH:  Past Medical History:  Diagnosis Date   Carotid stenosis    [place] 0000000:  R [id number]; LICA occluded; prox L subcl occluded; R subcl > 50%   COPD (chronic obstructive pulmonary disease) (HCC)    History of stroke    Echo 7/16:  EF 60-65   Hyperlipidemia    Pituitary tumor    s/p excision ~ 2000   Stenosis of right subclavian artery (HCC)    S/P STENT FOLLOWED BY DR. Leonie Man AND DR. [name]    TIA (transient ischemic attack)     Social History:  Social History   Socioeconomic History   Marital status: Divorced    Spouse name: Not on file   Number of children: 1   Years of education: Not on file   Highest education level: Not on file  Occupational History   Occupation: Disabled  Tobacco Use   Smoking status: Every Day    Packs/day: 1.27    Years: 40.00    Total pack years: 50.80    Types: Cigarettes    Last attempt to quit: 01/22/2014    Years since quitting: 8.9   Smokeless tobacco: Never  Vaping Use   Vaping Use: Never used  Substance and Sexual Activity   Alcohol use: Yes   Drug use: Never   Sexual activity: Yes  Other Topics Concern   Not on file  Social History Narrative   Not on file   Social Determinants of Health   Financial Resource Strain: Not on file  Food Insecurity: Not on file  Transportation Needs: Not on file  Physical Activity: Not on file  Stress: Not on file  Social Connections: Not on file  Intimate Partner Violence: Not on file    Medications:   Current Outpatient Medications on File Prior to Visit  Medication Sig Dispense Refill   acetaminophen (TYLENOL) 325 MG tablet Take 325 mg by mouth every 6 (six) hours as needed (for discomfort).     apixaban (ELIQUIS) 5 MG  TABS tablet TAKE 1 TABLET BY MOUTH TWICE A DAY 60 tablet 3   aspirin EC 81 MG EC tablet Take 1 tablet (81 mg total) by mouth daily. Swallow whole. 30 tablet 11   ezetimibe (ZETIA) 10 MG tablet Take 1 tablet (10 mg total) by mouth daily. 30 tablet 1   Multiple Vitamin (MULTIVITAMIN WITH MINERALS) TABS tablet Take 1 tablet by mouth 3 (three) times a week.     pantoprazole (PROTONIX) 40 MG tablet Take 1 tablet (40 mg total) by mouth 2 (two) times daily. 60 tablet 2   rosuvastatin (CRESTOR) 20 MG tablet Take 20 mg by mouth daily.     No current facility-administered medications  on file prior to visit.    Allergies:  No Known Allergies  Physical Exam General: Frail cachectic looking middle-aged Caucasian male, seated, in no evident distress Head: head normocephalic and atraumatic.   Neck: supple with soft right carotid, subclavian and vertebral bruits.   Cardiovascular: regular rate and rhythm, no murmurs Musculoskeletal: no deformity Skin:  no rash/petichiae Vascular:  Normal pulses all extremities  Neurologic Exam Mental Status: Awake and fully alert. Oriented to place and time. Recent and remote memory diminished. Attention span, concentration and fund of knowledge poor. Mood and affect appropriate.  Mini-Mental status exam score 22/30 with deficits in attention calculation and recall.  Clock drawing 3/4.  Able to name only 12 animals which can walk on 4 legs.  He was able to copy intersecting pentagons. Cranial Nerves: Fundoscopic exam reveals sharp disc margins. Pupils equal, briskly reactive to light. Extraocular movements full without nystagmus. Visual fields full to confrontation. Hearing intact. Facial sensation intact. Face, tongue, palate moves normally and symmetrically.  Motor: Normal bulk and tone. Normal strength in all tested extremity muscles. Sensory.: intact to touch , pinprick , position and vibratory sensation.  Coordination: Rapid alternating movements normal in all  extremities. Finger-to-nose and heel-to-shin performed accurately bilaterally. Gait and Station: Arises from chair without difficulty. Stance is normal. Gait demonstrates normal stride length and balance . Able to heel, toe and tandem walk with moderate difficulty.  Reflexes: 1+ and symmetric. Toes downgoing.   NIHSS  0 Modified Rankin  1     01/02/2023    9:29 AM 10/11/2021   11:03 AM  MMSE - Mini Mental State Exam  Orientation to time 3 5  Orientation to Place 5 5  Registration 3 3  Attention/ Calculation 2 1  Recall 1 1  Language- name 2 objects 2 2  Language- repeat 1 1  Language- follow 3 step command 3 3  Language- read & follow direction 1 1  Write a sentence 0 1  Copy design 1 0  Total score 22 23     ASSESSMENT: 62 year old Caucasian male with subacute short-term memory and cognitive difficulties due to mild cognitive impairment combination of vascular due to stroke as well as heavy alcohol drinking.  Remote history of stroke due to significant occlusive extracranial disease with multiple vascular risk factors of smoking, hyperlipidemia, atrial fibrillation occlusive cerebrovascular disease     PLANII had a long discussion with the patient and his sister regarding his memory loss and mild cognitive impairment which appears to be stable and I strongly advised him to cut back his alcohol intake.  And stop smoking.  Check vitamin B12 levels today and if yet low he may need B12 injections and I have advised him to see his primary care physician Dr. Moreen Fowler  for the same.  Start taking thiamine 100 mg daily as well as.  He was advised to increase participation in cognitively challenging activities like solving proper puzzles, playing bridge and to cook.  Continue Eliquis for stroke prevention for his known A-fib and maintain aggressive risk factor modification.  He was advised to increase participation in cognitively challenging activities like solving crossword puzzles, playing  bridge and sudoku.  We also discussed memory compensation strategies.  Return for follow-up in the future in 4 months with my nurse practitioner. Greater than 50% time during this prolonged 40-minute  isit was spent on counseling and coordination of care about his memory loss and cognitive impairment as well as remote stroke, occlusive cerebrovascular disease and  answering questions. Antony Contras, MD  Note: This document was prepared with digital dictation and possible smart phrase technology. Any transcriptional errors that result from this process are unintentional.

## 2023-01-02 NOTE — Patient Instructions (Signed)
I had a long discussion with the patient and his sister regarding his memory loss and mild cognitive impairment which appears to be stable and I strongly advised him to cut back his alcohol intake.  And stop smoking.  Check vitamin B12 levels today and if yet low he may need B12 injections and I have advised him to see his primary care physician Dr. Moreen Fowler  for the same.  Start taking thiamine 100 mg daily as well as.  He was advised to increase participation in cognitively challenging activities like solving proper puzzles, playing bridge and to cook.  Continue Eliquis for stroke prevention for his known A-fib and maintain aggressive risk factor modification.  He was advised to increase participation in cognitively challenging activities like solving crossword puzzles, playing bridge and sudoku.  We also discussed memory compensation strategies.  Return for follow-up in the future in 4 months with my nurse practitioner.  Memory Compensation Strategies  Use "WARM" strategy.  W= write it down  A= associate it  R= repeat it  M= make a mental note  2.   You can keep a Social worker.  Use a 3-ring notebook with sections for the following: calendar, important names and phone numbers,  medications, doctors' names/phone numbers, lists/reminders, and a section to journal what you did  each day.   3.    Use a calendar to write appointments down.  4.    Write yourself a schedule for the day.  This can be placed on the calendar or in a separate section of the Memory Notebook.  Keeping a  regular schedule can help memory.  5.    Use medication organizer with sections for each day or morning/evening pills.  You may need help loading it  6.    Keep a basket, or pegboard by the door.  Place items that you need to take out with you in the basket or on the pegboard.  You may also want to  include a message board for reminders.  7.    Use sticky notes.  Place sticky notes with reminders in a place where the  task is performed.  For example: " turn off the  stove" placed by the stove, "lock the door" placed on the door at eye level, " take your medications" on  the bathroom mirror or by the place where you normally take your medications.  8.    Use alarms/timers.  Use while cooking to remind yourself to check on food or as a reminder to take your medicine, or as a  reminder to make a call, or as a reminder to perform another task, etc.

## 2023-01-03 ENCOUNTER — Other Ambulatory Visit: Payer: Self-pay | Admitting: Cardiology

## 2023-01-03 DIAGNOSIS — I480 Paroxysmal atrial fibrillation: Secondary | ICD-10-CM

## 2023-01-03 LAB — VITAMIN B12: Vitamin B-12: 335 pg/mL (ref 232–1245)

## 2023-01-03 NOTE — Telephone Encounter (Signed)
Pt last saw Christen Bame, NP on 12/12/22, last labs 11/14/22 Creat 0.98, age 62, weight 68.8kg, based on specified criteria pt is on appropriate dosage of Eliquis '5mg'$  BID for afib.  Will refill rx.

## 2023-01-06 NOTE — Progress Notes (Signed)
Kindly inform the patient that vitamin B12 level is now normal and improved from last testing a year ago.

## 2023-01-16 ENCOUNTER — Telehealth: Payer: Self-pay

## 2023-01-16 NOTE — Telephone Encounter (Signed)
-----   Message from Garvin Fila, MD sent at 01/06/2023 11:46 AM EST ----- Kindly inform the patient that vitamin B12 level is now normal and improved from last testing a year ago.

## 2023-01-16 NOTE — Telephone Encounter (Signed)
I called patient.  I spoke with Santiago Glad, patient's sister, per DPR.  I advised her that patient's vitamin B12 level is now normal.  Patient sister verbalized understanding and had no questions or concerns at this time.

## 2023-04-21 ENCOUNTER — Emergency Department (HOSPITAL_COMMUNITY): Payer: Medicare Other

## 2023-04-21 ENCOUNTER — Encounter (HOSPITAL_COMMUNITY): Payer: Self-pay | Admitting: Emergency Medicine

## 2023-04-21 ENCOUNTER — Emergency Department (HOSPITAL_COMMUNITY)
Admission: EM | Admit: 2023-04-21 | Discharge: 2023-04-21 | Disposition: A | Discharge status: Home / Self Care | Payer: Medicare Other | Attending: Emergency Medicine | Admitting: Emergency Medicine

## 2023-04-21 ENCOUNTER — Other Ambulatory Visit: Payer: Self-pay

## 2023-04-21 DIAGNOSIS — J449 Chronic obstructive pulmonary disease, unspecified: Secondary | ICD-10-CM | POA: Diagnosis not present

## 2023-04-21 DIAGNOSIS — S73034A Other anterior dislocation of right hip, initial encounter: Secondary | ICD-10-CM | POA: Diagnosis not present

## 2023-04-21 DIAGNOSIS — Z96643 Presence of artificial hip joint, bilateral: Secondary | ICD-10-CM | POA: Diagnosis not present

## 2023-04-21 DIAGNOSIS — Z7982 Long term (current) use of aspirin: Secondary | ICD-10-CM | POA: Diagnosis not present

## 2023-04-21 DIAGNOSIS — Z7901 Long term (current) use of anticoagulants: Secondary | ICD-10-CM | POA: Diagnosis not present

## 2023-04-21 DIAGNOSIS — X58XXXA Exposure to other specified factors, initial encounter: Secondary | ICD-10-CM | POA: Diagnosis not present

## 2023-04-21 DIAGNOSIS — F17200 Nicotine dependence, unspecified, uncomplicated: Secondary | ICD-10-CM | POA: Diagnosis not present

## 2023-04-21 DIAGNOSIS — S73004A Unspecified dislocation of right hip, initial encounter: Secondary | ICD-10-CM | POA: Diagnosis not present

## 2023-04-21 DIAGNOSIS — Z743 Need for continuous supervision: Secondary | ICD-10-CM | POA: Diagnosis not present

## 2023-04-21 DIAGNOSIS — T84020A Dislocation of internal right hip prosthesis, initial encounter: Secondary | ICD-10-CM | POA: Diagnosis not present

## 2023-04-21 DIAGNOSIS — Z96641 Presence of right artificial hip joint: Secondary | ICD-10-CM | POA: Diagnosis not present

## 2023-04-21 LAB — CBC WITH DIFFERENTIAL/PLATELET
Abs Immature Granulocytes: 0.04 10*3/uL (ref 0.00–0.07)
Basophils Absolute: 0.1 10*3/uL (ref 0.0–0.1)
Basophils Relative: 1 %
Eosinophils Absolute: 0 10*3/uL (ref 0.0–0.5)
Eosinophils Relative: 0 %
HCT: 45.5 % (ref 39.0–52.0)
Hemoglobin: 15.7 g/dL (ref 13.0–17.0)
Immature Granulocytes: 0 %
Lymphocytes Relative: 11 %
Lymphs Abs: 1.2 10*3/uL (ref 0.7–4.0)
MCH: 33.2 pg (ref 26.0–34.0)
MCHC: 34.5 g/dL (ref 30.0–36.0)
MCV: 96.2 fL (ref 80.0–100.0)
Monocytes Absolute: 1.1 10*3/uL — ABNORMAL HIGH (ref 0.1–1.0)
Monocytes Relative: 10 %
Neutro Abs: 8.2 10*3/uL — ABNORMAL HIGH (ref 1.7–7.7)
Neutrophils Relative %: 78 %
Platelets: 286 10*3/uL (ref 150–400)
RBC: 4.73 MIL/uL (ref 4.22–5.81)
RDW: 13.9 % (ref 11.5–15.5)
WBC: 10.6 10*3/uL — ABNORMAL HIGH (ref 4.0–10.5)
nRBC: 0 % (ref 0.0–0.2)

## 2023-04-21 LAB — BASIC METABOLIC PANEL
Anion gap: 14 (ref 5–15)
BUN: 10 mg/dL (ref 8–23)
CO2: 18 mmol/L — ABNORMAL LOW (ref 22–32)
Calcium: 9.6 mg/dL (ref 8.9–10.3)
Chloride: 106 mmol/L (ref 98–111)
Creatinine, Ser: 1.44 mg/dL — ABNORMAL HIGH (ref 0.61–1.24)
GFR, Estimated: 55 mL/min — ABNORMAL LOW (ref >60)
Glucose, Bld: 128 mg/dL — ABNORMAL HIGH (ref 70–99)
Potassium: 3.8 mmol/L (ref 3.5–5.1)
Sodium: 138 mmol/L (ref 135–145)

## 2023-04-21 MED ORDER — PROPOFOL 10 MG/ML IV BOLUS
0.5000 mg/kg | Freq: Once | INTRAVENOUS | Status: AC
  Administered 2023-04-21: 33.4 mg via INTRAVENOUS

## 2023-04-21 MED ORDER — PROPOFOL 10 MG/ML IV BOLUS
INTRAVENOUS | Status: AC | PRN
  Administered 2023-04-21: 30 mg via INTRAVENOUS

## 2023-04-21 MED ORDER — HYDROMORPHONE HCL 1 MG/ML IJ SOLN
1.0000 mg | Freq: Once | INTRAMUSCULAR | Status: AC
  Administered 2023-04-21: 1 mg via INTRAVENOUS
  Filled 2023-04-21: qty 1

## 2023-04-21 MED ORDER — ONDANSETRON HCL 4 MG/2ML IJ SOLN
4.0000 mg | Freq: Once | INTRAMUSCULAR | Status: AC
  Administered 2023-04-21: 4 mg via INTRAVENOUS
  Filled 2023-04-21: qty 2

## 2023-04-21 MED ORDER — PROPOFOL 10 MG/ML IV BOLUS
INTRAVENOUS | Status: AC | PRN
  Administered 2023-04-21: 25 mg via INTRAVENOUS

## 2023-04-21 MED ORDER — PROPOFOL 10 MG/ML IV BOLUS
0.5000 mg/kg | Freq: Once | INTRAVENOUS | Status: AC
  Administered 2023-04-21: 33.4 mg via INTRAVENOUS
  Filled 2023-04-21: qty 20

## 2023-04-21 MED ORDER — SODIUM CHLORIDE 0.9 % IV SOLN: INTRAVENOUS | Status: DC

## 2023-04-21 NOTE — Progress Notes (Signed)
Orthopedic Tech Progress Note Patient Details:  Raymond Carney 05-30-1961 [id number]  Ortho Devices Type of Ortho Device: Knee Immobilizer Ortho Device/Splint Location: rle Ortho Device/Splint Interventions: Ordered, Application, Adjustment  I applied the brace post reduction at drs request. Post Interventions Patient Tolerated: Well Instructions Provided: Care of device, Adjustment of device  [name] Post 04/21/2023, 10:41 PM

## 2023-04-21 NOTE — Consult Note (Signed)
ORTHOPAEDIC CONSULTATION  REQUESTING PHYSICIAN: Vanetta Mulders, MD  Chief Complaint: Right hip pain and immobility  HPI: Raymond Carney is a 62 y.o. male who presents with right posterior hip dislocation status post [place].  He has subsequently had multiple dislocations which do require closed relation.  He presented to the emergency room today.  Attempted reduction was performed by the emergency room provider and orthopedics was subsequently consulted as this was not successful.  Denies any numbness or loss of sensation of his toes.  Past Medical History:  Diagnosis Date   Carotid stenosis    [place] 2017:  R 60-79; LICA occluded; prox L subcl occluded; R subcl > 50%   COPD (chronic obstructive pulmonary disease) (HCC)    History of stroke    Echo 7/16:  EF 60-65   Hyperlipidemia    Pituitary tumor    s/p excision ~ 2000   Stenosis of right subclavian artery (HCC)    S/P STENT FOLLOWED BY DR. Pearlean Brownie AND DR. Corliss Skains    TIA (transient ischemic attack)    Past Surgical History:  Procedure Laterality Date   bilateral hip replacements     ESOPHAGOGASTRODUODENOSCOPY (EGD) WITH PROPOFOL N/A 09/29/2019   Procedure: ESOPHAGOGASTRODUODENOSCOPY (EGD) WITH PROPOFOL;  Surgeon: Benancio Deeds, MD;  Location: [facility] ENDOSCOPY;  Service: Gastroenterology;  Laterality: N/A;   ESOPHAGOGASTRODUODENOSCOPY (EGD) WITH PROPOFOL N/A 02/18/2020   Procedure: ESOPHAGOGASTRODUODENOSCOPY (EGD) WITH PROPOFOL;  Surgeon: Willis Modena, MD;  Location: [facility] ENDOSCOPY;  Service: Endoscopy;  Laterality: N/A;   HIP ARTHROPLASTY Bilateral    ON DISABILITY SECONDARY    IR ANGIO INTRA EXTRACRAN SEL COM CAROTID INNOMINATE UNI R MOD SED  07/03/2019   IR ANGIOGRAM EXTREMITY RIGHT  07/03/2019   LEFT HEART CATH AND CORONARY ANGIOGRAPHY N/A 09/30/2019   Procedure: LEFT HEART CATH AND CORONARY ANGIOGRAPHY;  Surgeon: Lyn Records, MD;  Location: [facility] INVASIVE CV LAB;   Service: Cardiovascular;  Laterality: N/A;   OTHER SURGICAL HISTORY  1996   HISTORY OF PITUITARY TUMOR REMOVAL CIRCA   RADIOLOGY WITH ANESTHESIA N/A 05/26/2015   Procedure: RADIOLOGY WITH ANESTHESIA/ANGIOPLASTY;  Surgeon: Julieanne Cotton, MD;  Location: [facility] OR;  Service: Radiology;  Laterality: N/A;   RIGHT/LEFT HEART CATH AND CORONARY ANGIOGRAPHY N/A 10/12/2020   Procedure: RIGHT/LEFT HEART CATH AND CORONARY ANGIOGRAPHY;  Surgeon: Lennette Bihari, MD;  Location: [facility] INVASIVE CV LAB;  Service: Cardiovascular;  Laterality: N/A;   TRANSPHENOIDAL / TRANSNASAL HYPOPHYSECTOMY / RESECTION PITUITARY TUMOR     Social History   Socioeconomic History   Marital status: Divorced    Spouse name: Not on file   Number of children: 1   Years of education: Not on file   Highest education level: Not on file  Occupational History   Occupation: Disabled  Tobacco Use   Smoking status: Every Day    Packs/day: 1.27    Years: 40.00    Additional pack years: 0.00    Total pack years: 50.80    Types: Cigarettes    Last attempt to quit: 01/22/2014    Years since quitting: 9.2   Smokeless tobacco: Never  Vaping Use   Vaping Use: Never used  Substance and Sexual Activity   Alcohol use: Yes   Drug use: Never   Sexual activity: Yes  Other Topics Concern   Not on file  Social History Narrative   Not on file   Social Determinants of Corporate investment banker  Strain: Not on file  Food Insecurity: Not on file  Transportation Needs: Not on file  Physical Activity: Not on file  Stress: Not on file  Social Connections: Not on file   Family History  Problem Relation Age of Onset   Hypertension Mother    CAD Mother    Congestive Heart Failure Mother    Atrial fibrillation Mother    Stroke Father    Hypertension Father    CVA Father    CAD Father        s/p CABG   Prostate cancer Father    Colon cancer Sister 46   Colon polyps Neg Hx    Liver cancer Neg Hx    - negative except otherwise  stated in the family history section No Known Allergies Prior to Admission medications   Medication Sig Start Date End Date Taking? Authorizing Provider  acetaminophen (TYLENOL) 325 MG tablet Take 325 mg by mouth every 6 (six) hours as needed (for discomfort).    [provider]  apixaban (ELIQUIS) 5 MG TABS tablet TAKE 1 TABLET BY MOUTH TWICE A DAY 01/03/23   Jake Bathe, MD  aspirin EC 81 MG EC tablet Take 1 tablet (81 mg total) by mouth daily. Swallow whole. 10/15/20   Kathlen Mody, MD  Cyanocobalamin (VITAMIN B 12) 500 MCG TABS Take 1 tablet by mouth daily. 01/02/23   Micki Riley, MD  ezetimibe (ZETIA) 10 MG tablet Take 1 tablet (10 mg total) by mouth daily. 10/14/20   Kathlen Mody, MD  Multiple Vitamin (MULTIVITAMIN WITH MINERALS) TABS tablet Take 1 tablet by mouth 3 (three) times a week. 10/15/20   Kathlen Mody, MD  pantoprazole (PROTONIX) 40 MG tablet Take 1 tablet (40 mg total) by mouth 2 (two) times daily. 10/01/19   Rhetta Mura, MD  rosuvastatin (CRESTOR) 20 MG tablet Take 20 mg by mouth daily. 03/21/22   [provider]  thiamine (VITAMIN B1) 100 MG tablet Take 1 tablet (100 mg total) by mouth daily. 01/02/23   Micki Riley, MD   DG Hip [place] W or [place] Pelvis 1 View Right  Result Date: 04/21/2023 CLINICAL DATA:  Hip displacement. EXAM: DG HIP (WITH OR WITHOUT PELVIS) 1V PORT RIGHT COMPARISON:  None Available. FINDINGS: Total hip arthroplasty changes are noted at the right hip. There is no evidence of hardware loosening or acute fracture. Alignment of the femoral and acetabular cup is improved. Vascular calcifications are present in the pelvis and right lower extremity. Surgical clips are noted over the right hip. IMPRESSION: Total hip arthroplasty changes on the right with improved alignment of the femoral component and acetabular cup. No acute fracture is seen. Electronically Signed   By: Thornell Sartorius M.D.   On: 04/21/2023 22:19   DG Hip Unilat  With  Pelvis 2-3 Views Right  Result Date: 04/21/2023 CLINICAL DATA:  Acute onset right hip pain while attempting to enter vehicle with history of recurrent right hip dislocation EXAM: DG HIP (WITH OR WITHOUT PELVIS) 3V RIGHT COMPARISON:  None Available. FINDINGS: Prior bilateral hip arthroplasties. Hardware appears intact. Apparent posterolateral dislocation of the right femoral component relative to the acetabular component. There is no evidence of arthropathy or other focal bone abnormality. IMPRESSION: Suspected posterolateral dislocation of the right hip prosthesis. Electronically Signed   By: Agustin Cree M.D.   On: 04/21/2023 16:40     Positive ROS: All other systems have been reviewed and were otherwise negative with the exception of those  mentioned in the HPI and as above.  Physical Exam: General: No acute distress Cardiovascular: No pedal edema Respiratory: No cyanosis, no use of accessory musculature GI: No organomegaly, abdomen is soft and non-tender Skin: No lesions in the area of chief complaint Neurologic: Sensation intact distally Psychiatric: Patient is at baseline mood and affect Lymphatic: No axillary or cervical lymphadenopathy  MUSCULOSKELETAL:  Right hip is shortened externally rotated.  After reduction limb lengths are equal.  2+ dorsalis pedis pulse.  Sensation is intact throughout.  Fires EHL as well as tibialis anterior and gastrocsoleus  Independent Imaging Review: X-ray AP pelvis 3 views right hip: Status post closed reduction without evidence of complication, successful  Assessment: 62 year old male with successful right hip closed reduction with sedation.  Plan for posterior hip precautions.  He will follow-up outpatient for definitive management.  Please place a knee immobilizer to limit his knee flexion and hip flexion  Plan: May follow-up outpatient, strict posterior hip precautions  Thank you for the consult and the opportunity to see Mr. Arzell Mcgeehan, MD [facility] 10:25 PM

## 2023-04-21 NOTE — Discharge Instructions (Signed)
Keep the knee immobilizer on at all times can take it off to shower try not to bend your knee much.  Make an appointment to follow-up with orthopedics.  Information provided above.

## 2023-04-21 NOTE — ED Provider Notes (Addendum)
[place] EMERGENCY DEPARTMENT AT [facility] Provider Note   CSN: [id number] Arrival date & time: 04/21/23  1531     History  Chief Complaint  Patient presents with   Hip Pain    LENDELL GALLICK is a 62 y.o. male.  Patient with a history of hip replacement many years ago.  Sounds like it may have been done at [facility] not done locally.  He reports bilateral hip replacements has had trouble with the right hip dislocating.  At around early afternoon while attempting getting vehicle his right hip hurt and he was unable to move it.  X-ray here looks like a posterior hip dislocation.  No fall or injury.  Patient is on Eliquis past medical history sniffing for COPD stenosis of right subclavian artery status post stent for that hyperlipidemia carotid stenosis history of's stroke in 2016.  Pituitary tumor excision in 2000 and and history of TIAs.  It looks like left heart catheterization in 2020 and then had it repeated in 2021.  Patient is a every day smoker.      Home Medications Prior to Admission medications   Medication Sig Start Date End Date Taking? Authorizing Provider  acetaminophen (TYLENOL) 325 MG tablet Take 325 mg by mouth every 6 (six) hours as needed (for discomfort).    [provider]  apixaban (ELIQUIS) 5 MG TABS tablet TAKE 1 TABLET BY MOUTH TWICE A DAY 01/03/23   Jake Bathe, MD  aspirin EC 81 MG EC tablet Take 1 tablet (81 mg total) by mouth daily. Swallow whole. 10/15/20   Kathlen Mody, MD  Cyanocobalamin (VITAMIN B 12) 500 MCG TABS Take 1 tablet by mouth daily. 01/02/23   Micki Riley, MD  ezetimibe (ZETIA) 10 MG tablet Take 1 tablet (10 mg total) by mouth daily. 10/14/20   Kathlen Mody, MD  Multiple Vitamin (MULTIVITAMIN WITH MINERALS) TABS tablet Take 1 tablet by mouth 3 (three) times a week. 10/15/20   Kathlen Mody, MD  pantoprazole (PROTONIX) 40 MG tablet Take 1 tablet (40 mg total) by mouth 2 (two) times daily. 10/01/19   Rhetta Mura, MD  rosuvastatin (CRESTOR) 20 MG tablet Take 20 mg by mouth daily. 03/21/22   [provider]  thiamine (VITAMIN B1) 100 MG tablet Take 1 tablet (100 mg total) by mouth daily. 01/02/23   Micki Riley, MD      Allergies    Patient has no known allergies.    Review of Systems   Review of Systems  Constitutional:  Negative for chills and fever.  HENT:  Negative for ear pain and sore throat.   Eyes:  Negative for pain and visual disturbance.  Respiratory:  Negative for cough and shortness of breath.   Cardiovascular:  Negative for chest pain and palpitations.  Gastrointestinal:  Negative for abdominal pain and vomiting.  Genitourinary:  Negative for dysuria and hematuria.  Musculoskeletal:  Negative for arthralgias and back pain.  Skin:  Negative for color change and rash.  Neurological:  Negative for seizures and syncope.  All other systems reviewed and are negative.   Physical Exam Updated Vital Signs BP 105/80 (BP Location: Left Arm)   Pulse 69   Temp 98 F (36.7 C) (Oral)   Resp 15   Ht 1.626 m (5\' 4" )   Wt 66.7 kg   SpO2 100%   BMI 25.23 kg/m  Physical Exam Vitals and nursing note reviewed.  Constitutional:      General: He  is not in acute distress.    Appearance: Normal appearance. He is well-developed.  HENT:     Head: Normocephalic and atraumatic.  Eyes:     Extraocular Movements: Extraocular movements intact.     Conjunctiva/sclera: Conjunctivae normal.     Pupils: Pupils are equal, round, and reactive to light.  Cardiovascular:     Rate and Rhythm: Normal rate and regular rhythm.     Heart sounds: No murmur heard. Pulmonary:     Effort: Pulmonary effort is normal. No respiratory distress.     Breath sounds: Normal breath sounds. No wheezing or rhonchi.  Abdominal:     Palpations: Abdomen is soft.     Tenderness: There is no abdominal tenderness.  Musculoskeletal:        General: Deformity present. No swelling.     Cervical back:  Neck supple.     Comments: Patient was shortening of the right lower extremity.  Distally neurovascularly intact.  Left lower extremity normal.  Skin:    General: Skin is warm and dry.     Capillary Refill: Capillary refill takes less than 2 seconds.  Neurological:     General: No focal deficit present.     Mental Status: He is alert and oriented to person, place, and time.  Psychiatric:        Mood and Affect: Mood normal.    ED Results / Procedures / Treatments   Labs (all labs ordered are listed, but only abnormal results are displayed) Labs Reviewed  CBC WITH DIFFERENTIAL/PLATELET - Abnormal; Notable for the following components:      Result Value   WBC 10.6 (*)    Neutro Abs 8.2 (*)    Monocytes Absolute 1.1 (*)    All other components within normal limits  BASIC METABOLIC PANEL - Abnormal; Notable for the following components:   CO2 18 (*)    Glucose, Bld 128 (*)    Creatinine, Ser 1.44 (*)    GFR, Estimated 55 (*)    All other components within normal limits    EKG None  Radiology DG Hip Unilat  With Pelvis 2-3 Views Right  Result Date: 04/21/2023 CLINICAL DATA:  Acute onset right hip pain while attempting to enter vehicle with history of recurrent right hip dislocation EXAM: DG HIP (WITH OR WITHOUT PELVIS) 3V RIGHT COMPARISON:  None Available. FINDINGS: Prior bilateral hip arthroplasties. Hardware appears intact. Apparent posterolateral dislocation of the right femoral component relative to the acetabular component. There is no evidence of arthropathy or other focal bone abnormality. IMPRESSION: Suspected posterolateral dislocation of the right hip prosthesis. Electronically Signed   By: Agustin Cree M.D.   On: 04/21/2023 16:40    Procedures .Sedation  Date/Time: 04/21/2023 9:31 PM  Performed by: Vanetta Mulders, MD Authorized by: Vanetta Mulders, MD   Consent:    Consent obtained:  Written   Consent given by:  Patient   Risks discussed:  Inadequate sedation,  respiratory compromise necessitating ventilatory assistance and intubation, prolonged hypoxia resulting in organ damage, prolonged sedation necessitating reversal, allergic reaction, dysrhythmia, nausea and vomiting   Alternatives discussed:  Analgesia without sedation Universal protocol:    Procedure explained and questions answered to patient or proxy's satisfaction: yes     Relevant documents present and verified: yes     Test results available: yes     Imaging studies available: yes     Required blood products, implants, devices, and special equipment available: yes     Site/side marked: yes  Immediately prior to procedure, a time out was called: yes     Patient identity confirmed:  Verbally with patient Indications:    Procedure necessitating sedation performed by:  Physician performing sedation Pre-sedation assessment:    Time since last food or drink:  1400   ASA classification: class 3 - patient with severe systemic disease     Mouth opening:  2 finger widths   Mallampati score:  I - soft palate, uvula, fauces, pillars visible   Neck mobility: normal     Pre-sedation assessments completed and reviewed: airway patency, cardiovascular function, hydration status, mental status, nausea/vomiting, pain level and respiratory function     Pre-sedation assessment completed:  04/21/2023 9:34 PM Immediate pre-procedure details:    Reviewed: vital signs     Verified: bag valve mask available, emergency equipment available, intubation equipment available, IV patency confirmed, oxygen available and suction available   Procedure details (see MAR for exact dosages):    Preoxygenation:  Nasal cannula   Sedation:  Propofol   Intended level of sedation: deep   Analgesia:  Hydromorphone   Intra-procedure monitoring:  Blood pressure monitoring, continuous capnometry, frequent LOC assessments, cardiac monitor, continuous pulse oximetry and frequent vital sign checks   Intra-procedure events: none      Total Provider sedation time (minutes):  20 Post-procedure details:    Procedure completion:  Tolerated well, no immediate complications     Medications Ordered in ED Medications  0.9 %  sodium chloride infusion ( Intravenous New Bag/Given 04/21/23 2033)  ondansetron (ZOFRAN) injection 4 mg (4 mg Intravenous Given 04/21/23 1703)  HYDROmorphone (DILAUDID) injection 1 mg (1 mg Intravenous Given 04/21/23 1705)  HYDROmorphone (DILAUDID) injection 1 mg (1 mg Intravenous Given 04/21/23 2034)  propofol (DIPRIVAN) 10 mg/mL bolus/IV push 33.4 mg (33.4 mg Intravenous Given 04/21/23 2116)  propofol (DIPRIVAN) 10 mg/mL bolus/IV push (25 mg Intravenous Given 04/21/23 2122)    ED Course/ Medical Decision Making/ A&P                             Medical Decision Making Amount and/or Complexity of Data Reviewed Labs: ordered. Radiology: ordered.  Risk Prescription drug management.  Clinically patient and by x-ray patient has a posterior hip dislocation.  Will try to relocate it.  Discussed with on-call orthopedics Steward Drone he wants [place] to go and give a try here in the emergency department so we will do the sedation with propofol and try to relocate the hip.  Patient sedated with propofol was unsuccessful in getting the hip reduced.  Orthopedic surgeons on his way in.  Orthopedic surgeon came and we did resedation again with same dose of propofol and he was successful in reducing it.  Postreduction film shows that it is back in place patient feels much better very stable.  Will place him in a knee immobilizer orthopedics will follow him up as an outpatient.  Final Clinical Impression(s) / ED Diagnoses Final diagnoses:  Dislocation of right hip, initial encounter [facility])    Rx / DC Orders ED Discharge Orders     None         Vanetta Mulders, MD 04/21/23 2136    Vanetta Mulders, MD 04/21/23 2218

## 2023-04-21 NOTE — Procedures (Signed)
The patient was identified and the correct site was verified with universal protocol with nursing.  Propofol induction was performed.  A manual traction maneuver was performed and a large clunk was felt with a post reduction x-ray confirming reduction.  At this time limb lengths were equal.  There was 0 blood loss.  There were no complications.

## 2023-04-21 NOTE — Sedation Documentation (Signed)
Patient hip reduction successful and verified by xray.

## 2023-04-21 NOTE — ED Provider Notes (Signed)
Reduction of dislocation  Date/Time: 04/21/2023 9:31 PM  Performed by: Achille Rich, PA-C Authorized by: Achille Rich, PA-C  Consent: Verbal consent obtained. Written consent obtained. Patient understanding: patient states understanding of the procedure being performed Patient consent: the patient's understanding of the procedure matches consent given Imaging studies: imaging studies available Patient identity confirmed: verbally with patient Time out: Immediately prior to procedure a "time out" was called to verify the correct patient, procedure, equipment, support staff and site/side marked as required.  Sedation: Patient sedated: yes (Sedation was performed by my attending, Vanetta Mulders, MD.)  Comments: My attending and I attempted reduction of the right hip. Risks and benefits of the procedure were discussed. Verbal and written consent was obtained. Timeout was called before the procedure.Sedation performed by my attending. Tried traction/counter traction without success. My attending pulled on the leg as I push down on the hips. Attending to consult orthopedics for reduction.        Achille Rich, PA-C 04/21/23 2137    Vanetta Mulders, MD 04/21/23 2217

## 2023-04-21 NOTE — ED Triage Notes (Signed)
Per [facility] pt coming from home states earlier this afternoon while attempting to get in vehicle patients right hip hurt and unable to move it. Patient reports bilateral hip replacements and issues with right hip dislocating.

## 2023-04-21 NOTE — ED Notes (Signed)
Update given to pts sister via phone

## 2023-04-22 DIAGNOSIS — S73004S Unspecified dislocation of right hip, sequela: Secondary | ICD-10-CM | POA: Diagnosis not present

## 2023-04-26 ENCOUNTER — Ambulatory Visit (HOSPITAL_BASED_OUTPATIENT_CLINIC_OR_DEPARTMENT_OTHER): Payer: Medicare Other | Admitting: Orthopaedic Surgery

## 2023-04-26 DIAGNOSIS — S73004A Unspecified dislocation of right hip, initial encounter: Secondary | ICD-10-CM

## 2023-04-26 NOTE — Progress Notes (Signed)
Chief Complaint: Status post right hip closed reduction     History of Present Illness:    Raymond Carney is a 62 y.o. male presents today for first follow-up after closed reduction successfully in the emergency room of his right hip.  He has been compliant with knee immobilizer usage.  He has not had any recurrences of instability    Surgical History:   None  PMH/PSH/Family History/Social History/Meds/Allergies:    Past Medical History:  Diagnosis Date   Carotid stenosis    [place] 2017:  R 60-79; LICA occluded; prox L subcl occluded; R subcl > 50%   COPD (chronic obstructive pulmonary disease) (HCC)    History of stroke    Echo 7/16:  EF 60-65   Hyperlipidemia    Pituitary tumor    s/p excision ~ 2000   Stenosis of right subclavian artery (HCC)    S/P STENT FOLLOWED BY DR. Pearlean Brownie AND DR. Corliss Skains    TIA (transient ischemic attack)    Past Surgical History:  Procedure Laterality Date   bilateral hip replacements     ESOPHAGOGASTRODUODENOSCOPY (EGD) WITH PROPOFOL N/A 09/29/2019   Procedure: ESOPHAGOGASTRODUODENOSCOPY (EGD) WITH PROPOFOL;  Surgeon: Benancio Deeds, MD;  Location: [facility] ENDOSCOPY;  Service: Gastroenterology;  Laterality: N/A;   ESOPHAGOGASTRODUODENOSCOPY (EGD) WITH PROPOFOL N/A 02/18/2020   Procedure: ESOPHAGOGASTRODUODENOSCOPY (EGD) WITH PROPOFOL;  Surgeon: Willis Modena, MD;  Location: [facility] ENDOSCOPY;  Service: Endoscopy;  Laterality: N/A;   HIP ARTHROPLASTY Bilateral    ON DISABILITY SECONDARY    IR ANGIO INTRA EXTRACRAN SEL COM CAROTID INNOMINATE UNI R MOD SED  07/03/2019   IR ANGIOGRAM EXTREMITY RIGHT  07/03/2019   LEFT HEART CATH AND CORONARY ANGIOGRAPHY N/A 09/30/2019   Procedure: LEFT HEART CATH AND CORONARY ANGIOGRAPHY;  Surgeon: Lyn Records, MD;  Location: [facility] INVASIVE CV LAB;  Service: Cardiovascular;  Laterality: N/A;   OTHER SURGICAL HISTORY  1996   HISTORY OF PITUITARY TUMOR REMOVAL CIRCA   RADIOLOGY WITH  ANESTHESIA N/A 05/26/2015   Procedure: RADIOLOGY WITH ANESTHESIA/ANGIOPLASTY;  Surgeon: Julieanne Cotton, MD;  Location: [facility] OR;  Service: Radiology;  Laterality: N/A;   RIGHT/LEFT HEART CATH AND CORONARY ANGIOGRAPHY N/A 10/12/2020   Procedure: RIGHT/LEFT HEART CATH AND CORONARY ANGIOGRAPHY;  Surgeon: Lennette Bihari, MD;  Location: [facility] INVASIVE CV LAB;  Service: Cardiovascular;  Laterality: N/A;   TRANSPHENOIDAL / TRANSNASAL HYPOPHYSECTOMY / RESECTION PITUITARY TUMOR     Social History   Socioeconomic History   Marital status: Divorced    Spouse name: Not on file   Number of children: 1   Years of education: Not on file   Highest education level: Not on file  Occupational History   Occupation: Disabled  Tobacco Use   Smoking status: Every Day    Packs/day: 1.27    Years: 40.00    Additional pack years: 0.00    Total pack years: 50.80    Types: Cigarettes    Last attempt to quit: 01/22/2014    Years since quitting: 9.2   Smokeless tobacco: Never  Vaping Use   Vaping Use: Never used  Substance and Sexual Activity   Alcohol use: Yes   Drug use: Never   Sexual activity: Yes  Other Topics Concern   Not on file  Social History Narrative   Not on file  Social Determinants of Health   Financial Resource Strain: Not on file  Food Insecurity: Not on file  Transportation Needs: Not on file  Physical Activity: Not on file  Stress: Not on file  Social Connections: Not on file   Family History  Problem Relation Age of Onset   Hypertension Mother    CAD Mother    Congestive Heart Failure Mother    Atrial fibrillation Mother    Stroke Father    Hypertension Father    CVA Father    CAD Father        s/p CABG   Prostate cancer Father    Colon cancer Sister 82   Colon polyps Neg Hx    Liver cancer Neg Hx    No Known Allergies Current Outpatient Medications  Medication Sig Dispense Refill   acetaminophen (TYLENOL) 325 MG tablet Take 325 mg by mouth every 6 (six) hours as  needed (for discomfort).     apixaban (ELIQUIS) 5 MG TABS tablet TAKE 1 TABLET BY MOUTH TWICE A DAY 60 tablet 5   aspirin EC 81 MG EC tablet Take 1 tablet (81 mg total) by mouth daily. Swallow whole. 30 tablet 11   Cyanocobalamin (VITAMIN B 12) 500 MCG TABS Take 1 tablet by mouth daily. 30 tablet 3   ezetimibe (ZETIA) 10 MG tablet Take 1 tablet (10 mg total) by mouth daily. 30 tablet 1   Multiple Vitamin (MULTIVITAMIN WITH MINERALS) TABS tablet Take 1 tablet by mouth 3 (three) times a week.     pantoprazole (PROTONIX) 40 MG tablet Take 1 tablet (40 mg total) by mouth 2 (two) times daily. 60 tablet 2   rosuvastatin (CRESTOR) 20 MG tablet Take 20 mg by mouth daily.     thiamine (VITAMIN B1) 100 MG tablet Take 1 tablet (100 mg total) by mouth daily. 90 tablet 1   No current facility-administered medications for this visit.   No results found.  Review of Systems:   A ROS was performed including pertinent positives and negatives as documented in the HPI.  Physical Exam :   Constitutional: NAD and appears stated age Neurological: Alert and oriented Psych: Appropriate affect and cooperative There were no vitals taken for this visit.   Comprehensive Musculoskeletal Exam:    Leg lengths are equal.  Sits comfortably in a chair with the leg at 90 degrees of flexion.  Distal neurosensory exam is intact for EHL and gastrocsoleus  Imaging:     I personally reviewed and interpreted the radiographs.   Assessment:   62 y.o. male status post right hip closed reduction without complication.  At this time he continues to smoke and as result I do not necessarily believe he would be an ideal candidate for revision hip arthroplasty.  Will continue close management at this time with posterior hip precautions.  These were described.  He will follow-up as needed  Plan :    -Return to clinic as needed     I personally saw and evaluated the patient, and participated in the management and treatment  plan.  Huel Cote, MD Attending Physician, Orthopedic Surgery  This document was dictated using Dragon voice recognition software. A reasonable attempt at proof reading has been made to minimize errors.

## 2023-05-14 NOTE — Progress Notes (Deleted)
[facility] [address] [place]. [place] [address] [phone]       OFFICE FOLLOW-UP VISIT NOTE  [name] Date of Birth:  11-01-1960 Medical Record Number:  [id number]   Referring MD: Raymond Carney  Primary neurologist: Dr. Pearlean Carney Reason for Referral: Memory loss  No chief complaint on file.    HPI:   Update 05/15/2023 JM:      History provided for reference purposes only Update 01/02/2023 : Patient is referred back to see me today by Dr. Azucena Carney for memory loss or cognitive concerns.  He is accompanied by his sister who provides a lot of the history.  Patient last saw me in December 2022 but did not follow-up after that.  I had seen him for the same concerns and ordered lab work for reversible causes which had shown low vitamin B12 at 191 and increased homocystine at 19.1.  I have advised the patient to see primary care physician to start B12 injections but he clearly did not do that.  He had EEG on 11/21/2021 which was normal.  MRI scan of the brain on 11/18/2021 showed old left temporal and occipital infarct and left greater than right cerebellar infarcts.  There is evidence of chronic left ICA occlusion.  Patient states that he has done well since then.  He still has mild memory difficulties but these appear to be unchanged.  He has been drinking a lot and had 1 DWI and lost his driver's license.  Knowledgeable to try to get it back but his sister has concerns as patient will spend all his money drinking beer and smoking.  His sister handles the finances.  He is still drinking 6-8 beers a day and smokes a lot.  On Mini-Mental status score testing today scored 22/30 which is not significantly changed from last visit but he scored 23/30.  Last lab work on 11/18/2022 showed normal TSH, CBC and CMP.  LDL cholesterol was 70 mg percent.  Initial visit 10/20/2021 [name] is a 62 year old Caucasian male seen today for office consultation visit for memory loss.   He is accompanied by his sister.  History is obtained from them and review of electronic medical records and referral notes and opossum reviewed pertinent available imaging films in PACS.  He has past medical history of hyperlipidemia, COPD, chronic smoking, stroke and known extracranial intracranial occlusive disease.  Patient has had memory difficulties for the last 2 to 3 years since his mother died.  He has not been able to look after his own needs and his sister has been helping out who lives nearby.  Patient has more difficulty with short-term memory and often repeats himself and has trouble remembering recent information.  He can remember things from the past well.  He often gets confused easily and can misplace objects.  He needs help with his sister who helps with his groceries, shopping, paying his bills and handling his finances.  Patient has poor self-care.  He still smokes greater than 1 pack/day cigarettes despite being told not to do so.  Denies any delusions, hallucinations, agitation or unsafe behavior.  His gait and balance are fine has had no falls or injuries.  On the Mini-Mental status exam testing today scored 23/30.  He denies being depressed.  He has not had any work-up for reversible causes of memory loss or tried any medications to improve his memory.  He does not participate in any activities which are cognitively challenging.  There is no  family Struve dementia or memory loss.  He denies any headaches, seizures, loss of consciousness or recurrent stroke or TIA symptoms.  He has history of stroke and on 05/23/2015 when he presented with speech difficulties and right-sided weakness MRI at that time had shown old left subacute occipital infarct and cerebral catheter angiogram during that admission showed right subclavian artery is as well as right vertebral ostial stenosis and chronic left carotid and subclavian occlusions.  He was last seen in the office for follow-up on 12/22/2019 and had  done well in the interim without recurrent strokes or TIAs but had continued to smoke.  He presently remains on Eliquis for stroke prevention for diagnosis of A. fib as well as Crestor 40 mg which is tolerating well without muscle aches and pains.  His blood pressure well controlled today it is 102/68.     ROS:   14 system review of systems is positive for memory loss, confusion, disorientation and all other systems negative  PMH:  Past Medical History:  Diagnosis Date   Carotid stenosis    [place] 2017:  R 60-79; LICA occluded; prox L subcl occluded; R subcl > 50%   COPD (chronic obstructive pulmonary disease) (HCC)    History of stroke    Echo 7/16:  EF 60-65   Hyperlipidemia    Pituitary tumor    s/p excision ~ 2000   Stenosis of right subclavian artery (HCC)    S/P STENT FOLLOWED BY DR. Pearlean Carney AND DR. Corliss Carney    TIA (transient ischemic attack)     Social History:  Social History   Socioeconomic History   Marital status: Divorced    Spouse name: Not on file   Number of children: 1   Years of education: Not on file   Highest education level: Not on file  Occupational History   Occupation: Disabled  Tobacco Use   Smoking status: Every Day    Current packs/day: 0.00    Average packs/day: 1.3 packs/day for 40.0 years (50.8 ttl pk-yrs)    Types: Cigarettes    Start date: 01/22/1974    Last attempt to quit: 01/22/2014    Years since quitting: 9.3   Smokeless tobacco: Never  Vaping Use   Vaping status: Never Used  Substance and Sexual Activity   Alcohol use: Yes   Drug use: Never   Sexual activity: Yes  Other Topics Concern   Not on file  Social History Narrative   Not on file   Social Determinants of Health   Financial Resource Strain: Not on file  Food Insecurity: Not on file  Transportation Needs: Not on file  Physical Activity: Not on file  Stress: Not on file  Social Connections: Not on file  Intimate Partner Violence: Not on file    Medications:    Current Outpatient Medications on File Prior to Visit  Medication Sig Dispense Refill   acetaminophen (TYLENOL) 325 MG tablet Take 325 mg by mouth every 6 (six) hours as needed (for discomfort).     apixaban (ELIQUIS) 5 MG TABS tablet TAKE 1 TABLET BY MOUTH TWICE A DAY 60 tablet 5   aspirin EC 81 MG EC tablet Take 1 tablet (81 mg total) by mouth daily. Swallow whole. 30 tablet 11   Cyanocobalamin (VITAMIN B 12) 500 MCG TABS Take 1 tablet by mouth daily. 30 tablet 3   ezetimibe (ZETIA) 10 MG tablet Take 1 tablet (10 mg total) by mouth daily. 30 tablet 1   Multiple Vitamin (MULTIVITAMIN  WITH MINERALS) TABS tablet Take 1 tablet by mouth 3 (three) times a week.     pantoprazole (PROTONIX) 40 MG tablet Take 1 tablet (40 mg total) by mouth 2 (two) times daily. 60 tablet 2   rosuvastatin (CRESTOR) 20 MG tablet Take 20 mg by mouth daily.     thiamine (VITAMIN B1) 100 MG tablet Take 1 tablet (100 mg total) by mouth daily. 90 tablet 1   No current facility-administered medications on file prior to visit.    Allergies:  No Known Allergies  Physical Exam There were no vitals filed for this visit. There is no height or weight on file to calculate BMI.  General: Frail cachectic looking middle-aged Caucasian male, seated, in no evident distress Head: head normocephalic and atraumatic.   Neck: supple with soft right carotid, subclavian and vertebral bruits.   Cardiovascular: regular rate and rhythm, no murmurs Musculoskeletal: no deformity Skin:  no rash/petichiae Vascular:  Normal pulses all extremities  Neurologic Exam Mental Status: Awake and fully alert. Oriented to place and time. Recent and remote memory diminished. Attention span, concentration and fund of knowledge poor. Mood and affect appropriate.  Mini-Mental status exam score 22/30 with deficits in attention calculation and recall.  Clock drawing 3/4.  Able to name only 12 animals which can walk on 4 legs.  He was able to copy  intersecting pentagons. Cranial Nerves: Pupils equal, briskly reactive to light. Extraocular movements full without nystagmus. Visual fields full to confrontation. Hearing intact. Facial sensation intact. Face, tongue, palate moves normally and symmetrically.  Motor: Normal bulk and tone. Normal strength in all tested extremity muscles. Sensory.: intact to touch , pinprick , position and vibratory sensation.  Coordination: Rapid alternating movements normal in all extremities. Finger-to-nose and heel-to-shin performed accurately bilaterally. Gait and Station: Arises from chair without difficulty. Stance is normal. Gait demonstrates normal stride length and balance . Able to heel, toe and tandem walk with moderate difficulty.  Reflexes: 1+ and symmetric. Toes downgoing.        01/02/2023    9:29 AM 10/11/2021   11:03 AM  MMSE - Mini Mental State Exam  Orientation to time 3 5  Orientation to Place 5 5  Registration 3 3  Attention/ Calculation 2 1  Recall 1 1  Language- name 2 objects 2 2  Language- repeat 1 1  Language- follow 3 step command 3 3  Language- read & follow direction 1 1  Write a sentence 0 1  Copy design 1 0  Total score 22 23     ASSESSMENT/PLAN: 62 year old Caucasian male with subacute short-term memory and cognitive difficulties due to mild cognitive impairment combination of vascular due to stroke as well as heavy alcohol drinking.  Remote history of stroke due to significant occlusive extracranial disease with multiple vascular risk factors of smoking, hyperlipidemia, atrial fibrillation occlusive cerebrovascular disease   1.  MCI  -Nonprogressive  -MMSE ***/30 (prior 22/30)  -can consider use of Aricept or Namenda but as nonprogressive will hold off  -Discussed limiting EtOH use, continue thiamine 100 mg daily  -b12 335 (12/2022)  -Increase participation in cognitively challenging activities as well as routine follow-up with PCP for aggressive stroke risk factor  management       I spent *** minutes of face-to-face and non-face-to-face time with patient.  This included previsit chart review, lab review, study review, order entry, electronic health record documentation, patient education and discussion regarding above diagnoses and treatment plan and answered all other questions  to patient's satisfaction  Raymond Carney, [facility]  [facility] [address] [place], [place] [address]  Phone [phone] Fax [phone] Note: This document was prepared with digital dictation and possible smart phrase technology. Any transcriptional errors that result from this process are unintentional.

## 2023-05-15 ENCOUNTER — Ambulatory Visit: Payer: Medicare Other | Admitting: Adult Health

## 2023-05-20 ENCOUNTER — Other Ambulatory Visit: Payer: Self-pay | Admitting: Neurology

## 2023-06-11 ENCOUNTER — Ambulatory Visit: Payer: Medicare Other | Attending: Cardiology | Admitting: Cardiology

## 2023-06-28 NOTE — Progress Notes (Deleted)
[facility] [address] [place]. [place] [address] [phone]       OFFICE FOLLOW-UP VISIT NOTE  [name] Date of Birth:  January 07, 1961 Medical Record Number:  [id number]    Primary neurologist: Dr. Pearlean Brownie Reason for Referral: Memory loss   No chief complaint on file.     HPI:   Update 07/03/2023 JM: Patient returns for follow-up visit.  Reports cognition ***.   EtOH use ***  Lab work after prior visit showed B12 335 (improved from prior at 191)       History provided for reference purposes only Update 01/02/2023 Dr. Pearlean Brownie: Patient is referred back to see me today by Dr. Azucena Cecil for memory loss or cognitive concerns.  He is accompanied by his sister who provides a lot of the history.  Patient last saw me in December 2022 but did not follow-up after that.  I had seen him for the same concerns and ordered lab work for reversible causes which had shown low vitamin B12 at 191 and increased homocystine at 19.1.  I have advised the patient to see primary care physician to start B12 injections but he clearly did not do that.  He had EEG on 11/21/2021 which was normal.  MRI scan of the brain on 11/18/2021 showed old left temporal and occipital infarct and left greater than right cerebellar infarcts.  There is evidence of chronic left ICA occlusion.  Patient states that he has done well since then.  He still has mild memory difficulties but these appear to be unchanged.  He has been drinking a lot and had 1 DWI and lost his driver's license.  Knowledgeable to try to get it back but his sister has concerns as patient will spend all his money drinking beer and smoking.  His sister handles the finances.  He is still drinking 6-8 beers a day and smokes a lot.  On Mini-Mental status score testing today scored 22/30 which is not significantly changed from last visit but he scored 23/30.  Last lab work on 11/18/2022 showed normal TSH, CBC and CMP.  LDL cholesterol was 70 mg  percent.  Initial visit 10/20/2021 Dr. Pearlean Brownie: [name] is a 63 year old Caucasian male seen today for office consultation visit for memory loss.  He is accompanied by his sister.  History is obtained from them and review of electronic medical records and referral notes and opossum reviewed pertinent available imaging films in PACS.  He has past medical history of hyperlipidemia, COPD, chronic smoking, stroke and known extracranial intracranial occlusive disease.  Patient has had memory difficulties for the last 2 to 3 years since his mother died.  He has not been able to look after his own needs and his sister has been helping out who lives nearby.  Patient has more difficulty with short-term memory and often repeats himself and has trouble remembering recent information.  He can remember things from the past well.  He often gets confused easily and can misplace objects.  He needs help with his sister who helps with his groceries, shopping, paying his bills and handling his finances.  Patient has poor self-care.  He still smokes greater than 1 pack/day cigarettes despite being told not to do so.  Denies any delusions, hallucinations, agitation or unsafe behavior.  His gait and balance are fine has had no falls or injuries.  On the Mini-Mental status exam testing today scored 23/30.  He denies being depressed.  He has not had any work-up  for reversible causes of memory loss or tried any medications to improve his memory.  He does not participate in any activities which are cognitively challenging.  There is no family Struve dementia or memory loss.  He denies any headaches, seizures, loss of consciousness or recurrent stroke or TIA symptoms.  He has history of stroke and on 05/23/2015 when he presented with speech difficulties and right-sided weakness MRI at that time had shown old left subacute occipital infarct and cerebral catheter angiogram during that admission showed right subclavian artery is as well as  right vertebral ostial stenosis and chronic left carotid and subclavian occlusions.  He was last seen in the office for follow-up on 12/22/2019 and had done well in the interim without recurrent strokes or TIAs but had continued to smoke.  He presently remains on Eliquis for stroke prevention for diagnosis of A. fib as well as Crestor 40 mg which is tolerating well without muscle aches and pains.  His blood pressure well controlled today it is 102/68.     ROS:   14 system review of systems is positive for memory loss, confusion, disorientation and all other systems negative  PMH:  Past Medical History:  Diagnosis Date   Carotid stenosis    [place] 2017:  R 60-79; LICA occluded; prox L subcl occluded; R subcl > 50%   COPD (chronic obstructive pulmonary disease) (HCC)    History of stroke    Echo 7/16:  EF 60-65   Hyperlipidemia    Pituitary tumor    s/p excision ~ 2000   Stenosis of right subclavian artery (HCC)    S/P STENT FOLLOWED BY DR. Pearlean Brownie AND DR. Corliss Skains    TIA (transient ischemic attack)     Social History:  Social History   Socioeconomic History   Marital status: Divorced    Spouse name: Not on file   Number of children: 1   Years of education: Not on file   Highest education level: Not on file  Occupational History   Occupation: Disabled  Tobacco Use   Smoking status: Every Day    Current packs/day: 0.00    Average packs/day: 1.3 packs/day for 40.0 years (50.8 ttl pk-yrs)    Types: Cigarettes    Start date: 01/22/1974    Last attempt to quit: 01/22/2014    Years since quitting: 9.4   Smokeless tobacco: Never  Vaping Use   Vaping status: Never Used  Substance and Sexual Activity   Alcohol use: Yes   Drug use: Never   Sexual activity: Yes  Other Topics Concern   Not on file  Social History Narrative   Not on file   Social Determinants of Health   Financial Resource Strain: Not on file  Food Insecurity: Not on file  Transportation Needs: Not on file   Physical Activity: Not on file  Stress: Not on file  Social Connections: Not on file  Intimate Partner Violence: Not on file    Medications:   Current Outpatient Medications on File Prior to Visit  Medication Sig Dispense Refill   acetaminophen (TYLENOL) 325 MG tablet Take 325 mg by mouth every 6 (six) hours as needed (for discomfort).     apixaban (ELIQUIS) 5 MG TABS tablet TAKE 1 TABLET BY MOUTH TWICE A DAY 60 tablet 5   aspirin EC 81 MG EC tablet Take 1 tablet (81 mg total) by mouth daily. Swallow whole. 30 tablet 11   [employer] B-12 500 MCG tablet TAKE 1 TABLET BY MOUTH  EVERY DAY 90 tablet 1   ezetimibe (ZETIA) 10 MG tablet Take 1 tablet (10 mg total) by mouth daily. 30 tablet 1   Multiple Vitamin (MULTIVITAMIN WITH MINERALS) TABS tablet Take 1 tablet by mouth 3 (three) times a week.     pantoprazole (PROTONIX) 40 MG tablet Take 1 tablet (40 mg total) by mouth 2 (two) times daily. 60 tablet 2   rosuvastatin (CRESTOR) 20 MG tablet Take 20 mg by mouth daily.     thiamine (VITAMIN B1) 100 MG tablet Take 1 tablet (100 mg total) by mouth daily. 90 tablet 1   No current facility-administered medications on file prior to visit.    Allergies:  No Known Allergies  Physical Exam There were no vitals filed for this visit. There is no height or weight on file to calculate BMI.   General: Frail cachectic looking middle-aged Caucasian male, seated, in no evident distress Head: head normocephalic and atraumatic.   Neck: supple with soft right carotid, subclavian and vertebral bruits.   Cardiovascular: regular rate and rhythm, no murmurs Musculoskeletal: no deformity Skin:  no rash/petichiae Vascular:  Normal pulses all extremities  Neurologic Exam Mental Status: Awake and fully alert. Oriented to place and time. Recent and remote memory diminished. Attention span, concentration and fund of knowledge poor. Mood and affect appropriate.  Mini-Mental status exam score 22/30 with deficits in  attention calculation and recall.  Clock drawing 3/4.  Able to name only 12 animals which can walk on 4 legs.  He was able to copy intersecting pentagons. Cranial Nerves: Pupils equal, briskly reactive to light. Extraocular movements full without nystagmus. Visual fields full to confrontation. Hearing intact. Facial sensation intact. Face, tongue, palate moves normally and symmetrically.  Motor: Normal bulk and tone. Normal strength in all tested extremity muscles. Sensory.: intact to touch , pinprick , position and vibratory sensation.  Coordination: Rapid alternating movements normal in all extremities. Finger-to-nose and heel-to-shin performed accurately bilaterally. Gait and Station: Arises from chair without difficulty. Stance is normal. Gait demonstrates normal stride length and balance . Able to heel, toe and tandem walk with moderate difficulty.  Reflexes: 1+ and symmetric. Toes downgoing.       01/02/2023    9:29 AM 10/11/2021   11:03 AM  MMSE - Mini Mental State Exam  Orientation to time 3 5  Orientation to Place 5 5  Registration 3 3  Attention/ Calculation 2 1  Recall 1 1  Language- name 2 objects 2 2  Language- repeat 1 1  Language- follow 3 step command 3 3  Language- read & follow direction 1 1  Write a sentence 0 1  Copy design 1 0  Total score 22 23       ASSESSMENT: 62 year old Caucasian male with subacute short-term memory and cognitive difficulties due to mild cognitive impairment combination of vascular due to stroke as well as heavy alcohol drinking.  Remote history of stroke due to significant occlusive extracranial disease with multiple vascular risk factors of smoking, hyperlipidemia, atrial fibrillation occlusive cerebrovascular disease     PLAN:  1.  MCI -MMSE today ***/30 (prior 22/30) -Discussed importance of limiting EtOH use and stopping tobacco use.  Advised to further discuss with PCP if assistance is needed -B12 335 (12/2022) -Increase  participation in cognitively challenging activities, ensure routine activity and exercise, ensure good sleep, healthy diet and routine socialization  2. Hx of stroke -Continue Eliquis and Crestor for secondary stroke prevention managed by PCP/cardiology -Continue routine follow-up with  cardiology for atrial fibrillation Eliquis management -Continue routine follow-up with PCP for aggressive stroke risk factor management      I spent *** minutes of face-to-face and non-face-to-face time with patient.  This included previsit chart review, lab review, study review, order entry, electronic health record documentation, patient education and discussion regarding above diagnoses and treatment plan and answered all other questions to patient's satisfaction  Ihor Austin, [facility]  [facility] [address] [place], [place] [address]  Phone [phone] Fax [phone] Note: This document was prepared with digital dictation and possible smart phrase technology. Any transcriptional errors that result from this process are unintentional.

## 2023-07-03 ENCOUNTER — Ambulatory Visit: Payer: Medicare Other | Admitting: Adult Health

## 2023-08-15 ENCOUNTER — Other Ambulatory Visit: Payer: Self-pay | Admitting: Cardiology

## 2023-09-02 ENCOUNTER — Other Ambulatory Visit: Payer: Self-pay | Admitting: Neurology

## 2023-09-25 ENCOUNTER — Other Ambulatory Visit: Payer: Self-pay | Admitting: Cardiology

## 2023-09-25 ENCOUNTER — Telehealth: Payer: Self-pay | Admitting: Cardiology

## 2023-09-25 NOTE — Telephone Encounter (Signed)
Patient's sister would like a call back to review patient's medication list. She states patient "has a little dementia," and she would like to confirm which medications are needed prior to requesting refills.

## 2023-09-25 NOTE — Telephone Encounter (Signed)
Spoke with pt's sister Clydie Braun) ok per DPR.  Reviewed medications as listed but advised pt has not been seen here since February.  Sister will check with his PCP as well.  Advised he has to continue taking Eliquis.  He is supposed to be taking ezetimibe 10 mg and rosuvastatin 20 mg daily as well.  She was appreciative of the call back and information.

## 2023-10-15 ENCOUNTER — Encounter: Payer: Self-pay | Admitting: Acute Care

## 2023-10-15 ENCOUNTER — Other Ambulatory Visit: Payer: Self-pay | Admitting: Acute Care

## 2023-10-15 DIAGNOSIS — Z87891 Personal history of nicotine dependence: Secondary | ICD-10-CM

## 2023-10-15 DIAGNOSIS — F17210 Nicotine dependence, cigarettes, uncomplicated: Secondary | ICD-10-CM

## 2023-11-10 DIAGNOSIS — I63233 Cerebral infarction due to unspecified occlusion or stenosis of bilateral carotid arteries: Secondary | ICD-10-CM | POA: Diagnosis not present

## 2023-11-10 DIAGNOSIS — I70201 Unspecified atherosclerosis of native arteries of extremities, right leg: Secondary | ICD-10-CM | POA: Diagnosis not present

## 2023-11-10 DIAGNOSIS — I672 Cerebral atherosclerosis: Secondary | ICD-10-CM | POA: Diagnosis not present

## 2023-11-10 DIAGNOSIS — I6340 Cerebral infarction due to embolism of unspecified cerebral artery: Secondary | ICD-10-CM | POA: Diagnosis not present

## 2023-11-10 DIAGNOSIS — E1151 Type 2 diabetes mellitus with diabetic peripheral angiopathy without gangrene: Secondary | ICD-10-CM | POA: Diagnosis not present

## 2023-11-10 DIAGNOSIS — I6502 Occlusion and stenosis of left vertebral artery: Secondary | ICD-10-CM | POA: Diagnosis not present

## 2023-11-10 DIAGNOSIS — Z66 Do not resuscitate: Secondary | ICD-10-CM | POA: Diagnosis not present

## 2023-11-10 DIAGNOSIS — I2510 Atherosclerotic heart disease of native coronary artery without angina pectoris: Secondary | ICD-10-CM | POA: Diagnosis not present

## 2023-11-10 DIAGNOSIS — K922 Gastrointestinal hemorrhage, unspecified: Secondary | ICD-10-CM | POA: Diagnosis not present

## 2023-11-10 DIAGNOSIS — R6889 Other general symptoms and signs: Secondary | ICD-10-CM | POA: Diagnosis not present

## 2023-11-10 DIAGNOSIS — M47812 Spondylosis without myelopathy or radiculopathy, cervical region: Secondary | ICD-10-CM | POA: Diagnosis not present

## 2023-11-10 DIAGNOSIS — Z4682 Encounter for fitting and adjustment of non-vascular catheter: Secondary | ICD-10-CM | POA: Diagnosis not present

## 2023-11-10 DIAGNOSIS — R6521 Severe sepsis with septic shock: Secondary | ICD-10-CM | POA: Diagnosis not present

## 2023-11-10 DIAGNOSIS — I700 Atherosclerosis of aorta: Secondary | ICD-10-CM | POA: Diagnosis not present

## 2023-11-10 DIAGNOSIS — J90 Pleural effusion, not elsewhere classified: Secondary | ICD-10-CM | POA: Diagnosis not present

## 2023-11-10 DIAGNOSIS — G459 Transient cerebral ischemic attack, unspecified: Secondary | ICD-10-CM | POA: Diagnosis not present

## 2023-11-10 DIAGNOSIS — I499 Cardiac arrhythmia, unspecified: Secondary | ICD-10-CM | POA: Diagnosis not present

## 2023-11-10 DIAGNOSIS — A419 Sepsis, unspecified organism: Secondary | ICD-10-CM | POA: Diagnosis not present

## 2023-11-10 DIAGNOSIS — I639 Cerebral infarction, unspecified: Secondary | ICD-10-CM | POA: Diagnosis not present

## 2023-11-10 DIAGNOSIS — J9601 Acute respiratory failure with hypoxia: Secondary | ICD-10-CM | POA: Diagnosis not present

## 2023-11-10 DIAGNOSIS — J440 Chronic obstructive pulmonary disease with acute lower respiratory infection: Secondary | ICD-10-CM | POA: Diagnosis not present

## 2023-11-10 DIAGNOSIS — Z452 Encounter for adjustment and management of vascular access device: Secondary | ICD-10-CM | POA: Diagnosis not present

## 2023-11-10 DIAGNOSIS — Z7901 Long term (current) use of anticoagulants: Secondary | ICD-10-CM | POA: Diagnosis not present

## 2023-11-10 DIAGNOSIS — I708 Atherosclerosis of other arteries: Secondary | ICD-10-CM | POA: Diagnosis not present

## 2023-11-10 DIAGNOSIS — I5022 Chronic systolic (congestive) heart failure: Secondary | ICD-10-CM | POA: Diagnosis not present

## 2023-11-10 DIAGNOSIS — I4891 Unspecified atrial fibrillation: Secondary | ICD-10-CM | POA: Diagnosis not present

## 2023-11-10 DIAGNOSIS — F17210 Nicotine dependence, cigarettes, uncomplicated: Secondary | ICD-10-CM | POA: Diagnosis not present

## 2023-11-10 DIAGNOSIS — R918 Other nonspecific abnormal finding of lung field: Secondary | ICD-10-CM | POA: Diagnosis not present

## 2023-11-10 DIAGNOSIS — I110 Hypertensive heart disease with heart failure: Secondary | ICD-10-CM | POA: Diagnosis not present

## 2023-11-10 DIAGNOSIS — R778 Other specified abnormalities of plasma proteins: Secondary | ICD-10-CM | POA: Diagnosis not present

## 2023-11-10 DIAGNOSIS — E785 Hyperlipidemia, unspecified: Secondary | ICD-10-CM | POA: Diagnosis not present

## 2023-11-10 DIAGNOSIS — J811 Chronic pulmonary edema: Secondary | ICD-10-CM | POA: Diagnosis not present

## 2023-11-10 DIAGNOSIS — M6282 Rhabdomyolysis: Secondary | ICD-10-CM | POA: Diagnosis not present

## 2023-11-10 DIAGNOSIS — K920 Hematemesis: Secondary | ICD-10-CM | POA: Diagnosis not present

## 2023-11-10 DIAGNOSIS — Z743 Need for continuous supervision: Secondary | ICD-10-CM | POA: Diagnosis not present

## 2023-11-10 DIAGNOSIS — I6521 Occlusion and stenosis of right carotid artery: Secondary | ICD-10-CM | POA: Diagnosis not present

## 2023-11-10 DIAGNOSIS — Z515 Encounter for palliative care: Secondary | ICD-10-CM | POA: Diagnosis not present

## 2023-11-10 DIAGNOSIS — J690 Pneumonitis due to inhalation of food and vomit: Secondary | ICD-10-CM | POA: Diagnosis not present

## 2023-11-10 DIAGNOSIS — R531 Weakness: Secondary | ICD-10-CM | POA: Diagnosis not present

## 2023-11-10 DIAGNOSIS — G8194 Hemiplegia, unspecified affecting left nondominant side: Secondary | ICD-10-CM | POA: Diagnosis not present

## 2023-11-10 DIAGNOSIS — R4789 Other speech disturbances: Secondary | ICD-10-CM | POA: Diagnosis not present

## 2023-11-10 DIAGNOSIS — Z8673 Personal history of transient ischemic attack (TIA), and cerebral infarction without residual deficits: Secondary | ICD-10-CM | POA: Diagnosis not present

## 2023-11-10 DIAGNOSIS — R570 Cardiogenic shock: Secondary | ICD-10-CM | POA: Diagnosis not present

## 2023-11-10 DIAGNOSIS — Z7982 Long term (current) use of aspirin: Secondary | ICD-10-CM | POA: Diagnosis not present

## 2023-11-12 DIAGNOSIS — R000 Tachycardia, unspecified: Secondary | ICD-10-CM | POA: Diagnosis not present

## 2023-11-30 ENCOUNTER — Other Ambulatory Visit: Payer: Medicare Other

## 2023-12-01 DEATH — deceased

## 2023-12-11 ENCOUNTER — Ambulatory Visit: Payer: Medicare Other | Admitting: Physician Assistant

## 2023-12-18 ENCOUNTER — Ambulatory Visit: Payer: Medicare Other | Admitting: Adult Health
